# Patient Record
Sex: Male | Born: 1935
Health system: Southern US, Community
[De-identification: ages and names within clinical notes are randomized; demographics above are authoritative.]

## PROBLEM LIST (undated history)

## (undated) DIAGNOSIS — J45909 Unspecified asthma, uncomplicated: Secondary | ICD-10-CM

## (undated) DIAGNOSIS — I10 Essential (primary) hypertension: Secondary | ICD-10-CM

---

## 1982-03-28 HISTORY — PX: HERNIA REPAIR: SHX51

## 1990-03-28 HISTORY — PX: KNEE SURGERY: SHX244

## 1999-03-29 HISTORY — PX: ROTATOR CUFF REPAIR: SHX139

## 1999-10-05 ENCOUNTER — Ambulatory Visit (HOSPITAL_COMMUNITY): Admission: RE | Admit: 1999-10-05 | Discharge: 1999-10-05 | Payer: Self-pay | Admitting: Specialist

## 1999-10-05 ENCOUNTER — Encounter: Payer: Self-pay | Admitting: Specialist

## 1999-10-19 ENCOUNTER — Encounter: Payer: Self-pay | Admitting: Specialist

## 1999-10-20 ENCOUNTER — Inpatient Hospital Stay (HOSPITAL_COMMUNITY): Admission: RE | Admit: 1999-10-20 | Discharge: 1999-10-21 | Payer: Self-pay | Admitting: Specialist

## 2002-04-18 ENCOUNTER — Ambulatory Visit (HOSPITAL_COMMUNITY): Admission: RE | Admit: 2002-04-18 | Discharge: 2002-04-18 | Payer: Self-pay | Admitting: Unknown Physician Specialty

## 2002-07-16 ENCOUNTER — Ambulatory Visit (HOSPITAL_COMMUNITY): Admission: RE | Admit: 2002-07-16 | Discharge: 2002-07-16 | Payer: Self-pay | Admitting: Neurology

## 2002-07-16 ENCOUNTER — Encounter: Payer: Self-pay | Admitting: Neurology

## 2002-08-13 ENCOUNTER — Encounter: Admission: RE | Admit: 2002-08-13 | Discharge: 2002-09-05 | Payer: Self-pay | Admitting: Neurology

## 2003-05-14 ENCOUNTER — Ambulatory Visit (HOSPITAL_COMMUNITY): Admission: RE | Admit: 2003-05-14 | Discharge: 2003-05-14 | Payer: Self-pay | Admitting: Cardiology

## 2003-06-03 ENCOUNTER — Ambulatory Visit (HOSPITAL_COMMUNITY): Admission: RE | Admit: 2003-06-03 | Discharge: 2003-06-03 | Payer: Self-pay | Admitting: Cardiology

## 2003-06-03 ENCOUNTER — Encounter: Admission: RE | Admit: 2003-06-03 | Discharge: 2003-06-03 | Payer: Self-pay | Admitting: Cardiology

## 2003-07-10 ENCOUNTER — Ambulatory Visit (HOSPITAL_COMMUNITY): Admission: RE | Admit: 2003-07-10 | Discharge: 2003-07-10 | Payer: Self-pay | Admitting: Internal Medicine

## 2005-03-28 HISTORY — PX: CATARACT EXTRACTION: SUR2

## 2006-03-28 HISTORY — PX: OTHER SURGICAL HISTORY: SHX169

## 2008-03-28 HISTORY — PX: OTHER SURGICAL HISTORY: SHX169

## 2008-04-15 ENCOUNTER — Ambulatory Visit (HOSPITAL_COMMUNITY): Admission: RE | Admit: 2008-04-15 | Discharge: 2008-04-16 | Payer: Self-pay | Admitting: General Surgery

## 2008-04-19 ENCOUNTER — Emergency Department (HOSPITAL_COMMUNITY): Admission: EM | Admit: 2008-04-19 | Discharge: 2008-04-19 | Payer: Self-pay | Admitting: Emergency Medicine

## 2010-07-12 LAB — URINALYSIS, ROUTINE W REFLEX MICROSCOPIC
Bilirubin Urine: NEGATIVE
Glucose, UA: NEGATIVE mg/dL
Glucose, UA: NEGATIVE mg/dL
Hgb urine dipstick: NEGATIVE
Ketones, ur: NEGATIVE mg/dL
Ketones, ur: 15 mg/dL — AB
Nitrite: NEGATIVE
Protein, ur: NEGATIVE mg/dL
Protein, ur: NEGATIVE mg/dL
Specific Gravity, Urine: 1.017 (ref 1.005–1.030)
Urobilinogen, UA: 0.2 mg/dL (ref 0.0–1.0)
pH: 5.5 (ref 5.0–8.0)

## 2010-07-12 LAB — COMPREHENSIVE METABOLIC PANEL
ALT: 18 U/L (ref 0–53)
AST: 22 U/L (ref 0–37)
CO2: 32 mEq/L (ref 19–32)
Chloride: 104 mEq/L (ref 96–112)
GFR calc Af Amer: >60 mL/min (ref >60)
GFR calc non Af Amer: >60 mL/min (ref >60)
Potassium: 4.6 mEq/L (ref 3.5–5.1)
Sodium: 145 mEq/L (ref 135–145)
Total Bilirubin: 1.6 mg/dL — ABNORMAL HIGH (ref 0.3–1.2)

## 2010-07-12 LAB — CBC
HCT: 42.2 % (ref 39.0–52.0)
Hemoglobin: 14.5 g/dL (ref 13.0–17.0)
MCHC: 34.3 g/dL (ref 30.0–36.0)
MCV: 90.9 fL (ref 78.0–100.0)
Platelets: 193 10*3/uL (ref 150–400)
RBC: 4.65 MIL/uL (ref 4.22–5.81)
RDW: 13.8 % (ref 11.5–15.5)
WBC: 7.4 10*3/uL (ref 4.0–10.5)

## 2010-07-12 LAB — DIFFERENTIAL
Basophils Absolute: 0 10*3/uL (ref 0.0–0.1)
Eosinophils Absolute: 0.2 10*3/uL (ref 0.0–0.7)
Eosinophils Relative: 3 % (ref 0–5)
Lymphs Abs: 1.1 10*3/uL (ref 0.7–4.0)

## 2010-07-12 LAB — URINE CULTURE: Culture: NO GROWTH

## 2010-08-10 NOTE — Op Note (Signed)
[place], [place]                 ACCOUNT NO.:  [id number]   MEDICAL RECORD NO.:  [id number]          PATIENT TYPE:  AMB   LOCATION:  DAY                          FACILITY:  [facility]   PHYSICIAN:  Angelia Mould. Derrell Lolling, M.D.DATE OF BIRTH:  Oct 22, 1926   DATE OF PROCEDURE:  04/15/2008  DATE OF DISCHARGE:                               OPERATIVE REPORT   PREOPERATIVE DIAGNOSES:  1. Recurrent right inguinal hernia.  2. Left inguinal hernia.   POSTOPERATIVE DIAGNOSES:  1. Recurrent right inguinal hernia.  2. Left inguinal hernia.   OPERATION PERFORMED:  Laparoscopic repair(TEPP) of bilateral inguinal  hernias with mesh, right side recurrent.   SURGEON:  Angelia Mould. Derrell Lolling, M.D.   OPERATIVE INDICATIONS:  This is a 76 year old white man who underwent a  right inguinal hernia repair in 1984.  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  On exam in  the office, he has a scar in the right inguinal area and a recurrent  hernia present which was reducible.  He also had a small reducible left  inguinal hernia.  We discussed his situation and decided to proceed with  a laparoscopic repair of his bilateral inguinal hernias.   OPERATIVE FINDINGS:  On the right side the patient clearly had an  incarcerated direct inguinal hernia which I was able to reduce, clearly  delineating the defect in the Hesselbach triangle.  He also had a lot of  scarring along the cord as the peritoneum went up along the cord  structures.  There were a couple of sutures there where I suspected a  sac had been tied off before.  On the left side he had a moderately  large indirect hernia which I was able to clearly delineate and strip  away and out of the inguinal canal, but there was no evidence of direct  hernia on the left.   OPERATIVE TECHNIQUE:  Following the induction of general endotracheal  anesthesia,  the patient's abdomen and genitalia were prepped and draped  in a sterile fashion.  Intravenous antibiotics were given.  The patient  was identified as the correct patient and correct procedure and correct  site.  Half percent Marcaine with epinephrine was used as a local  infiltration anesthetic.  A curved transverse incision was made at the  lower rim of the umbilicus.  The fascia was incised transversely,  exposing the medial border of the right rectus muscle.  A dissector  balloon was placed in the right rectus sheath in the midline down to  just above the symphysis pubis.  The video camera was inserted.  The  dissector balloon was inflated with air under direct vision and held in  place for about 3-4 minutes.  We had good dissection with the rectus  muscles and inferior epigastric vessels bilaterally, dissected  anteriorly, preperitoneal fat dissected posteriorly, and I could see the  symphysis pubis and bilateral Cooper ligaments inferiorly.  The  dissector balloon was deflated and removed.  The trocar balloon was  inflated and the trocar secured and connected to the insufflator at 12  mmHg.  We inserted the video camera and we had good visualization.  We  placed a 5-mm trocar in the midline below the umbilicus and used this to  clean the fascia off laterally as the dissector balloon had not done a  very lateral dissection.  We did this on both sides.  We placed 5-mm  trocars again, one in the right lower quadrant and one in the left lower  quadrant actually at about the level of the umbilicus.  We then  dissected the symphysis pubis and Cooper ligament, exposing them nicely.  On the right side we dissected the indirect hernia out of his pseudosac  and returned the fatty tissue to the preperitoneal space.  On the right  side we dissected the peritoneum away from the cord structures all the  way back to the level of the anterior superior iliac spine.  At this  point we could see the  testicular vessels and vas deferens and that is  really about all that was left.  We turned our attention to the left  side where we cleaned off and mobilized the cord structures and we then  dissected a large indirect sac back out of the inguinal canal and  dissected it well back above the level of the anterior superior iliac  spine.  On each side we placed a Bard 3DMax mesh.  The large size was  used on the left and on the right.  On each side we positioned the mesh  so that it overlapped the midline slightly and overlapped Cooper  ligament inferiorly slightly.  On each side the mesh was tacked along  the superior rim of Cooper ligament with about 4 tacks.  On each side  laterally we placed tacks into the mesh lateral to the cord structures,  and we were very careful to be sure that we could palpate the tacker  through the abdominal wall to stay above the iliopubic tract.  We also  placed on each side a few tacks along the posterior belly of the rectus  muscle and up along the midline.  We then inspected the repairs on each  side and they looked very good and were fairly symmetrical.  This  covered the indirect space, direct space, and femoral space quite  nicely.  There was no bleeding.  Trocars were removed.  The  pneumoperitoneum was released.  The patient did have a significant  pneumoperitoneum and we placed a Veress needle in the right upper  quadrant and evacuated the pneumoperitoneum without any difficulty.  The  trocars were removed.  The fascia at the umbilicus was closed with 2  interrupted figure-of-eight sutures of 0 Vicryl.  After irrigating the  wounds, we closed all the skin incisions with subcuticular sutures of 4-  0 Monocryl and Dermabond.  Clean bandages were placed and the patient  taken to the recovery room in stable condition.  Estimated blood loss  was about 15 mL.  Complications were none.  Sponge, needle and  instrument counts were correct.      Angelia Mould.  Derrell Lolling, M.D.  Electronically Signed     HMI/MEDQ  D:  04/15/2008  T:  04/15/2008  Job:  [id number]   cc:   Joycelyn Rua, M.D.  Fax: [phone]

## 2010-08-13 NOTE — Op Note (Signed)
[facility]  Patient:    Guy Espinoza, Guy Espinoza                        MRN: [id number] Proc. Date: 10/20/99 Adm. Date:  [id number] Disc. Date: [id number] Attending:  Erasmo Leventhal                           Operative Report  PREOPERATIVE DIAGNOSIS:  Left shoulder rotator cuff tear, symptomatic degenerative acromioclavicular joint.  POSTOPERATIVE DIAGNOSIS:  Left shoulder rotator cuff tear, symptomatic degenerative acromioclavicular joint.  PROCEDURE:  Left shoulder subacromial decompression, rotator cuff repair, distal clavicle resection, Mumford procedure.  SURGEON:  R. Valma Cava, M.D.  ASSISTANT:  Cherly Beach, P.A.-C.  ANESTHESIA:  _______ interscalene block and general endotracheal.  ESTIMATED BLOOD LOSS:  Less than 50 cc.  DRAINS:  None.  COMPLICATIONS:  None.  DISPOSITION:  PACU stable.  DESCRIPTION OF PROCEDURE:  The patient was counseled in the holding area and ________ identified.  The chart was removed and papers were signed.  IV started, antibiotics given, and block administered.  Antibiotics were given. Taken to the operating room and placed in the supine position under general endotracheal anesthesia.  Modified beach chair position.  The left shoulder prepped and draped in a sterile fashion.  A longitudinal incision was made through the skin and subcutaneous tissue overlying the [facility] joint and anterior acromion, through the skin and subcutaneous tissues, and small veins electrocoagulated.  The periosteum opened in the usual fashion.  The periosteum was opened over the distal clavicle, AC joint, and anterior acromion.  Deltoid was split, but not detached.  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  Attention was directed to the subacromial region.  The deltoid trapezial fascia was opened.  The rotator cuff  was inspected and found to have a 1 cm tear of the supraspinatus and quite a bit of subacromial bursitis. Subacromial bursectomy was performed.  Type 2 acromion.  Acromioplasty was performed.  The CA ligament was released.  The rotator cuff was inspected anteriorly and based on the MRI scan, he was to have a subscapularis tear.  However, the subscapularis was intact.  The biceps tendon was intact.  The rotator cuff interval did have a small rent in it.  At this time, the second hole revealed a second torn rotator cuff and a #2 Ethibond suture was used to repair that.  The lateral edge of the supraspinatus was also torn at the insertion of the greater tuberosity.  The edges were freshened with a 15 blade.  Bone was prepared for insertion as a corkscrew.  We utilized in all directions to corkscrew system.  This was done.  Then, the rotator cuff was repaired anatomically without any undue tension out to its normal insertion.  Shoulder ________.  Also, preoperatively, MRI scan showed a possible labral tear. However, due to the area of his cuff tear, it was not able to visualize into the joint.  I did not feel like it would be worthwhile to open this gentlemans joint for that significantly that required opening of the rotator cuff.  The _______ and the rotator cuff was repaired.  The wounds were irrigated.  Hemostasis was obtained.  A meticulous closure of the deltoid trapezial fascia and clavicular periosteum was done over the [facility] joint and leading edge of  the acromion with Vicryl suture.  _________ and protected throughout the case.  Subcutaneous closed with Vicryl and the skin closed with staples.  A sterile dressing was applied and another gram of Ancef was given intravenously.  He was then awakened, extubated, and taken to from the operating room to PACU in stable condition.  There were no complications. Sponge and needle count were correct.  The decreased surgical time with ______  throughout this case. DD:  10/20/99 TD:  10/22/99 Job: [id number]

## 2010-08-13 NOTE — Cardiovascular Report (Signed)
NAME:  Guy Espinoza, Guy Espinoza                           ACCOUNT NO.:  [id number]   MEDICAL RECORD NO.:  [id number]                   PATIENT TYPE:  OIB   LOCATION:  2899                                 FACILITY:  [facility]   PHYSICIAN:  Armanda Magic, M.D.                  DATE OF BIRTH:  07/07/1926   DATE OF PROCEDURE:  05/14/2003  DATE OF DISCHARGE:                              CARDIAC CATHETERIZATION   REFERRING PHYSICIAN:  Joycelyn Rua, M.D.   PROCEDURES PERFORMED:  1. Left heart catheterization.  2. Coronary angiography.  3. Left ventriculography.   CARDIOLOGIST:  Armanda Magic, M.D.   INDICATIONS:  Shortness of breath and abnormal Cardiolite.   COMPLICATIONS:  None.   ENTRY SITE:  Intravenous access via right femoral artery, 6 French sheath.   INDICATIONS FOR PROCEDURE:  This is a very pleasant 77 year old white male  with a history of hypertension and shortness of breath; and, his shortness  of breath has recently worsened and he presented for a stress Cardiolite  study, which showed a reversible defect in the inferior wall.  He now  presents for cardiac catheterization.   DESCRIPTION OF PROCEDURE:  The patient was brought to the cardiac  catheterization laboratory in the fasting, nonsedated state.  Informed  consent was obtained.  The patient was connected to continuous heart rate  and pulse oximetry monitoring and intermittent blood pressure monitoring.  The right groin was prepped and draped in a sterile fashion.  One percent  Xylocaine was used for local anesthesia.  Using the modified Seldinger  technique a 6 French sheath was placed in the right femoral artery.  Under  fluoroscopic guidance 6 French JL-4 catheter was placed in the left coronary  artery.  Multiple cine films were taken in the 30-degree RAO and 40-degree  LAO views.  This catheter was then exchanged out of out guidewire for a 6  [place] JR-4 catheter, which was placed under fluoroscopic guidance into the  right coronary artery.  Multiple cine films were taken in the 30-ddegree RAO  and 40-degree LAO views.  This catheter was then exchanged out of our  guidewire for a 6 French  angled pigtail catheter, which was placed under  fluoroscopic guidance in the left ventricular cavity.  Left ventriculography  was performed in 30-degree RAO views using 30 mL of contrast at 15 mL per  second.  The catheter was then pulled back across the aortic valve with no  significant gradient noted.   At the end of the procedure all catheters and sheaths were removed.  Manual  compression was performed until adequate hemostasis was obtained.   The patient was transferred back to his room in stable condition.   RESULTS:  Left Main Coronary Artery:  The left main coronary artery is  widely patent.  It bifurcates into the left anterior descending artery and  left circumflex artery.   Left Anterior  Descending Artery:  The left anterior descending artery is  widely patent in its proximal portion and gives rise to a very small first  diagonal branch.  In between the first and second diagonal branches there is  a 40-50% fairly long narrowing.  The second diagonal is widely patent.  The  distal LAD traverses to the apex and is widely patent.   Left Circumflex:  The left circumflex is patent throughout its course.  It  gives rise to one very large obtuse marginal branch, which has a 30%  proximal narrowing.  It then bifurcates into daughter branches, both of  which are widely patent.   Right Coronary Artery:  The right coronary artery is widely patent  throughout its course and is a very large vessel, which bifurcates distally  into the posterior descending artery and posterolateral artery, both of  which are widely patent.   Left ventriculography:  Left ventriculography shows low-normal left  ventricular systolic function.  EF 50%.  Left ventricular pressure 160/9  mmHg.  LVEDP 14 mmHg.  Aortic pressure 157/83  mmHg.   ASSESSMENT:  1. Shortness of breath of questionable etiology.  2. Nonobstructive coronary disease.  3. Low-normal left ventricular function.   PLAN:  1. Discharge to home after IV fluids and bedrest.  2. Outpatient 2-D echocardiogram to evaluate other etiologies for shortness     of breath such as diastolic dysfunction.  If this is normal then he will     need a pulmonary workup.  3. Would continue current medications.                                               Armanda Magic, M.D.    TT/MEDQ  D:  05/14/2003  T:  05/14/2003  Job:  [id number]   cc:   Joycelyn Rua, M.D.  [address] [place]  [place] [address]  Fax: [phone]

## 2013-09-13 DIAGNOSIS — I739 Peripheral vascular disease, unspecified: Secondary | ICD-10-CM | POA: Insufficient documentation

## 2014-09-15 DIAGNOSIS — M4800 Spinal stenosis, site unspecified: Secondary | ICD-10-CM | POA: Diagnosis present

## 2017-06-16 ENCOUNTER — Emergency Department (HOSPITAL_COMMUNITY): Payer: Medicare Other

## 2017-06-16 ENCOUNTER — Emergency Department (HOSPITAL_COMMUNITY)
Admission: EM | Admit: 2017-06-16 | Discharge: 2017-06-16 | Disposition: A | Discharge status: Home / Self Care | Payer: Medicare Other | Attending: Emergency Medicine | Admitting: Emergency Medicine

## 2017-06-16 ENCOUNTER — Encounter (HOSPITAL_COMMUNITY): Payer: Self-pay | Admitting: Emergency Medicine

## 2017-06-16 DIAGNOSIS — R197 Diarrhea, unspecified: Secondary | ICD-10-CM | POA: Diagnosis not present

## 2017-06-16 DIAGNOSIS — Z79899 Other long term (current) drug therapy: Secondary | ICD-10-CM | POA: Diagnosis not present

## 2017-06-16 DIAGNOSIS — R112 Nausea with vomiting, unspecified: Secondary | ICD-10-CM | POA: Diagnosis present

## 2017-06-16 DIAGNOSIS — E860 Dehydration: Secondary | ICD-10-CM | POA: Diagnosis not present

## 2017-06-16 DIAGNOSIS — R109 Unspecified abdominal pain: Secondary | ICD-10-CM | POA: Insufficient documentation

## 2017-06-16 DIAGNOSIS — J45909 Unspecified asthma, uncomplicated: Secondary | ICD-10-CM | POA: Insufficient documentation

## 2017-06-16 DIAGNOSIS — I10 Essential (primary) hypertension: Secondary | ICD-10-CM | POA: Insufficient documentation

## 2017-06-16 DIAGNOSIS — Z7982 Long term (current) use of aspirin: Secondary | ICD-10-CM | POA: Diagnosis not present

## 2017-06-16 DIAGNOSIS — R55 Syncope and collapse: Secondary | ICD-10-CM | POA: Insufficient documentation

## 2017-06-16 HISTORY — DX: Unspecified asthma, uncomplicated: J45.909

## 2017-06-16 HISTORY — DX: Essential (primary) hypertension: I10

## 2017-06-16 LAB — COMPREHENSIVE METABOLIC PANEL
ALK PHOS: 52 U/L (ref 38–126)
ALT: 16 U/L — AB (ref 17–63)
AST: 24 U/L (ref 15–41)
Albumin: 3.8 g/dL (ref 3.5–5.0)
Anion gap: 11 (ref 5–15)
BUN: 24 mg/dL — ABNORMAL HIGH (ref 6–20)
CALCIUM: 8.6 mg/dL — AB (ref 8.9–10.3)
CO2: 27 mmol/L (ref 22–32)
CREATININE: 0.93 mg/dL (ref 0.61–1.24)
Chloride: 102 mmol/L (ref 101–111)
GFR calc Af Amer: >60 mL/min (ref >60)
Glucose, Bld: 106 mg/dL — ABNORMAL HIGH (ref 65–99)
Potassium: 3.4 mmol/L — ABNORMAL LOW (ref 3.5–5.1)
Sodium: 140 mmol/L (ref 135–145)
Total Bilirubin: 1.2 mg/dL (ref 0.3–1.2)
Total Protein: 6.8 g/dL (ref 6.5–8.1)

## 2017-06-16 LAB — URINALYSIS, ROUTINE W REFLEX MICROSCOPIC
Bilirubin Urine: NEGATIVE
Glucose, UA: NEGATIVE mg/dL
HGB URINE DIPSTICK: NEGATIVE
Ketones, ur: NEGATIVE mg/dL
LEUKOCYTES UA: NEGATIVE
Nitrite: NEGATIVE
Protein, ur: NEGATIVE mg/dL
Specific Gravity, Urine: 1.028 (ref 1.005–1.030)
pH: 5 (ref 5.0–8.0)

## 2017-06-16 LAB — CBC
HCT: 39.9 % (ref 39.0–52.0)
Hemoglobin: 13.3 g/dL (ref 13.0–17.0)
MCH: 30.9 pg (ref 26.0–34.0)
MCHC: 33.3 g/dL (ref 30.0–36.0)
MCV: 92.6 fL (ref 78.0–100.0)
Platelets: 171 10*3/uL (ref 150–400)
RBC: 4.31 MIL/uL (ref 4.22–5.81)
RDW: 13.8 % (ref 11.5–15.5)
WBC: 5.2 10*3/uL (ref 4.0–10.5)

## 2017-06-16 LAB — LIPASE, BLOOD: Lipase: 22 U/L (ref 11–51)

## 2017-06-16 LAB — POC OCCULT BLOOD, ED: FECAL OCCULT BLD: NEGATIVE

## 2017-06-16 MED ORDER — OMEPRAZOLE 20 MG PO CPDR: 20.0000 mg | DELAYED_RELEASE_CAPSULE | Freq: Every day | ORAL | 0 refills | Status: DC

## 2017-06-16 MED ORDER — ONDANSETRON HCL 4 MG PO TABS: 4.0000 mg | ORAL_TABLET | Freq: Four times a day (QID) | ORAL | 0 refills | Status: DC

## 2017-06-16 MED ORDER — IOPAMIDOL (ISOVUE-300) INJECTION 61%
100.0000 mL | Freq: Once | INTRAVENOUS | Status: AC | PRN
  Administered 2017-06-16: 100 mL via INTRAVENOUS

## 2017-06-16 MED ORDER — SODIUM CHLORIDE 0.9 % IJ SOLN
INTRAMUSCULAR | Status: AC
  Filled 2017-06-16: qty 50

## 2017-06-16 MED ORDER — IOPAMIDOL (ISOVUE-300) INJECTION 61%
INTRAVENOUS | Status: AC
  Filled 2017-06-16: qty 100

## 2017-06-16 NOTE — ED Notes (Signed)
ED Provider at Bedside 

## 2017-06-16 NOTE — ED Notes (Signed)
Pt given Water

## 2017-06-16 NOTE — ED Notes (Signed)
Occult card in room

## 2017-06-16 NOTE — ED Notes (Signed)
Patient transported to CT 

## 2017-06-16 NOTE — ED Notes (Signed)
This RN attempted to gain IV access x1. Will attempt 1 more time, then consult another nurse.

## 2017-06-16 NOTE — Discharge Instructions (Signed)
Your evaluated in the emergency department for nausea vomiting and diarrhea.  Your blood work was unremarkable.  Your CAT scan did show some thickening of the upper stomach and a small hiatal hernia.  This may be related to acid damage to the stomach.  We are prescribing you an antacid and some medicine for nausea.  He should continue a clear liquid diet and advance slowly as tolerated.  Please follow-up with your doctor and return to the emergency department if any worsening symptoms.

## 2017-06-16 NOTE — ED Triage Notes (Signed)
Per pt, states diarrhea that started yesterday-states little PO intake-states he vomited 3 times last night and has had 3 loose stools since 0500-states abdominal soreness, not really pain-took Pepto with no relief

## 2017-06-16 NOTE — ED Notes (Signed)
Bed: WA06 Expected date:  Expected time:  Means of arrival:  Comments: Hold for triage 2 

## 2017-06-16 NOTE — ED Provider Notes (Signed)
[facility]-EMERGENCY DEPT Provider Note   CSN: [id number] Arrival date & time: 06/16/17  1043     History   Chief Complaint Chief Complaint  Patient presents with  . Diarrhea  . Near Syncope    HPI [name] is a 84 y.o. male.  A 84 year old male presents with acute onset of 2 episodes of vomiting food material and multiple episodes of incontinence of stool black and watery in nature.  Associated with some diffuse abdominal pain.  He denies any fever.  No sick contacts.  He rates the abdominal pain is mild.  He is tried nothing for it.  He called his PCP and was told to come to the emergency department for CAT scan.  The history is provided by the patient.  Diarrhea   This is a new problem. The current episode started 2 days ago. The problem occurs 2 to 4 times per day. The problem has been gradually improving. The stool consistency is described as watery. There has been no fever. Associated symptoms include abdominal pain, vomiting and cough. Pertinent negatives include no chills and no arthralgias. He has tried nothing for the symptoms. The treatment provided no relief.  Near Syncope  Associated symptoms include abdominal pain. Pertinent negatives include no chest pain and no shortness of breath.    Past Medical History:  Diagnosis Date  . Asthma   . Hypertension     There are no active problems to display for this patient.    PMH/F/S reviewed by me    Home Medications    Prior to Admission medications   Medication Sig Start Date End Date Taking? Authorizing Provider  aspirin EC 81 MG tablet Take 81 mg by mouth daily.   Yes [provider]  Ferrous Sulfate (IRON) 325 (65 Fe) MG TABS Take 1 tablet by mouth daily.   Yes [provider]  hydrochlorothiazide (HYDRODIURIL) 25 MG tablet Take 25 mg by mouth daily.   Yes [provider]  Multiple Vitamins-Minerals (MULTIVITAMIN ADULT) TABS Take 1 tablet by mouth daily.    Yes [provider]  Omega-3 Fatty Acids (FISH OIL PO) Take 1 capsule by mouth daily.   Yes [provider]  OMEGA-3 KRILL OIL PO Take 1 capsule by mouth daily.   Yes [provider]  saw palmetto 160 MG capsule Take 160 mg by mouth daily.   Yes [provider]  tamsulosin (FLOMAX) 0.4 MG CAPS capsule Take 0.4 mg by mouth daily after supper.   Yes [provider]  omeprazole (PRILOSEC) 20 MG capsule Take 1 capsule (20 mg total) by mouth daily. 06/16/17   Terrilee FilesButler, Michael C, MD  ondansetron (ZOFRAN) 4 MG tablet Take 1 tablet (4 mg total) by mouth every 6 (six) hours. 06/16/17   Terrilee FilesButler, Michael C, MD    Family History No family history on file.  Social History Social History   Tobacco Use  . Smoking status: Never Smoker  Substance Use Topics  . Alcohol use: Not Currently  . Drug use: Never     Allergies   Patient has no known allergies.   Review of Systems Review of Systems  Constitutional: Negative for chills and fever.  HENT: Negative for ear pain and sore throat.   Eyes: Negative for pain and visual disturbance.  Respiratory: Positive for cough. Negative for shortness of breath.   Cardiovascular: Positive for near-syncope. Negative for chest pain and palpitations.  Gastrointestinal: Positive for abdominal pain, diarrhea and vomiting.  Genitourinary: Negative for dysuria and hematuria.  Musculoskeletal: Positive for back pain. Negative for arthralgias.  Skin: Negative for color change and rash.  Neurological: Negative for seizures and syncope.  All other systems reviewed and are negative.    Physical Exam Updated Vital Signs BP 140/76 (BP Location: Right Arm)   Pulse 93   Temp 97.9 F (36.6 C) (Oral)   Resp (!) 22   Ht 5\' 9"  (1.753 m)   Wt 63.5 kg (140 lb)   SpO2 95%   BMI 20.67 kg/m   Physical Exam  Constitutional: He appears well-developed and well-nourished.  HENT:  Head: Normocephalic and atraumatic.  Eyes:  Conjunctivae are normal.  Neck: Neck supple.  Cardiovascular: Normal rate and regular rhythm.  No murmur heard. Pulmonary/Chest: Effort normal and breath sounds normal. No respiratory distress.  Abdominal: Soft. He exhibits no mass. There is tenderness (ruq). There is no rebound and no guarding.  Musculoskeletal: He exhibits no edema, tenderness or deformity.  Neurological: He is alert.  Skin: Skin is warm and dry.  Psychiatric: He has a normal mood and affect.  Nursing note and vitals reviewed.    ED Treatments / Results  Labs (all labs ordered are listed, but only abnormal results are displayed) Labs Reviewed  COMPREHENSIVE METABOLIC PANEL - Abnormal; Notable for the following components:      Result Value   Potassium 3.4 (*)    Glucose, Bld 106 (*)    BUN 24 (*)    Calcium 8.6 (*)    ALT 16 (*)    All other components within normal limits  LIPASE, BLOOD  CBC  URINALYSIS, ROUTINE W REFLEX MICROSCOPIC  POC OCCULT BLOOD, ED    EKG  EKG Interpretation None       EKG Interpretation  Date/Time:  Friday June 16 2017 13:35:40 EDT Ventricular Rate:  56 PR Interval:    QRS Duration: 119 QT Interval:  428 QTC Calculation: 413 R Axis:   -67 Text Interpretation:  Unknown rhythm, irregular rate LAD, consider left anterior fascicular block Anterior infarct, old nonspecific lateral t waves Confirmed by Meridee Score [phone]) on 06/16/2017 1:46:02 PM        Radiology Ct Abdomen Pelvis W Contrast  Result Date: 06/16/2017 CLINICAL DATA:  Acute generalized abdominal pain, 2 episodes of vomiting, multiple episodes of incontinence with watery black stool, no fever, history asthma, hypertension EXAM: CT ABDOMEN AND PELVIS WITH CONTRAST TECHNIQUE: Multidetector CT imaging of the abdomen and pelvis was performed using the standard protocol following bolus administration of intravenous contrast. CONTRAST:  ISOVUE-300 IOPAMIDOL (ISOVUE-300) INJECTION 61% IV. Dilute oral  contrast. COMPARISON:  None FINDINGS: Lower chest: Minimal atelectasis at base of lingula and dependently in RIGHT lower lobe. Lungs appear hyperinflated but clear. Hepatobiliary: Tiny hepatic cysts. Distended gallbladder without definite wall thickening or calcification. No biliary dilatation or additional hepatic abnormalities. Pancreas: Normal appearance Spleen: Normal appearance Adrenals/Urinary Tract: Adrenal glands, kidneys, ureters, and bladder normal appearance Stomach/Bowel: Normal appendix. Large and small bowel loops unremarkable. Stomach incompletely distended. Small hiatal hernia with question wall thickening of distal esophagus and gastroesophageal junction, could be inflammatory or neoplastic in origin. Distal gastric antrum also shows questionable wall thickening versus artifact from underdistention. Vascular/Lymphatic: Scattered pelvic phleboliths. Atherosclerotic calcifications aorta, iliac arteries and coronary arteries. Aorta normal caliber. No adenopathy. Reproductive: Prostatic enlargement gland measuring 5.2 x 3.7 x 3.5 cm. Other: No free air or free fluid. Question prior ventral hernia repair in pelvis. No acute inflammatory process. Musculoskeletal: Bones  demineralized with multilevel degenerative disc disease changes scoliosis of the thoracolumbar spine. IMPRESSION: Small hiatal hernia with questionable gastric wall thickening at the cardia extending into distal esophagus, less at distal gastric antrum, could be related to gastritis/esophagitis though neoplasm not excluded; follow-up EGD recommended. Prostatic enlargement. Electronically Signed   By: Ulyses Southward M.D.   On: 06/16/2017 14:47    Procedures Procedures (including critical care time)  Medications Ordered in ED Medications - No data to display   Initial Impression / Assessment and Plan / ED Course  I have reviewed the triage vital signs and the nursing notes.  Pertinent labs & imaging results that were available  during my care of the patient were reviewed by me and considered in my medical decision making (see chart for details).  Clinical Course as of Jun 16 1908  Fri Jun 16, 2017  1609 Patient has had no further vomiting or diarrhea since he has been here.  He is guaiac negative from below.  He does admit that he has been taking Pepto-Bismol and this is likely accounts for his black stools.  His lab work was unremarkable with a normal white count and H&H.  His potassium which is slightly low but the rest of his electrolytes look very normal.  We are going to try to p.o. trial him here and if he does well he likely can be discharged to follow-up with his PCP.   [MB]  1627 Patient's been ambulation in the bathroom to urinate and states his nausea and vomiting resolved.  He had just a small little bit of stool.  He has been drinking here and feels comfortable going home.  He has a follow-up appointment with his PCP next week and understands to return if any worsening symptoms.   [MB]    Clinical Course User Index [MB] Terrilee Files, MD     Final Clinical Impressions(s) / ED Diagnoses   Final diagnoses:  Nausea vomiting and diarrhea  Dehydration    ED Discharge Orders        Ordered    ondansetron (ZOFRAN) 4 MG tablet  Every 6 hours     06/16/17 1619    omeprazole (PRILOSEC) 20 MG capsule  Daily     06/16/17 1619       Terrilee Files, MD 06/16/17 1912

## 2018-01-02 ENCOUNTER — Ambulatory Visit (INDEPENDENT_AMBULATORY_CARE_PROVIDER_SITE_OTHER): Payer: Medicare Other | Admitting: Neurology

## 2018-01-02 ENCOUNTER — Encounter: Payer: Self-pay | Admitting: Neurology

## 2018-01-02 ENCOUNTER — Telehealth: Payer: Self-pay | Admitting: Neurology

## 2018-01-02 VITALS — BP 135/86 | HR 76 | Ht 69.0 in | Wt 126.0 lb

## 2018-01-02 DIAGNOSIS — G3281 Cerebellar ataxia in diseases classified elsewhere: Secondary | ICD-10-CM

## 2018-01-02 DIAGNOSIS — R269 Unspecified abnormalities of gait and mobility: Secondary | ICD-10-CM | POA: Diagnosis not present

## 2018-01-02 DIAGNOSIS — H814 Vertigo of central origin: Secondary | ICD-10-CM

## 2018-01-02 DIAGNOSIS — W19XXXA Unspecified fall, initial encounter: Secondary | ICD-10-CM

## 2018-01-02 DIAGNOSIS — R42 Dizziness and giddiness: Secondary | ICD-10-CM

## 2018-01-02 DIAGNOSIS — E538 Deficiency of other specified B group vitamins: Secondary | ICD-10-CM

## 2018-01-02 NOTE — Progress Notes (Signed)
Reason for visit: Dizziness, gait disturbance  Referring physician: Dr. Ruthann Cancer is a 84 y.o. male  History of present illness:  Guy Espinoza is an 84 year old right-handed white male with a history of positional vertigo, treated by Dr. Sandria Manly in 2004.  At that time, MRI of the brain showed a moderate level of periventricular white matter disease excluding the brainstem area.  The patient has had episodes of vertigo off and on since that time, he may have some dizziness if he rolls over in bed or makes sudden head movement.  The patient was washing his truck in mid August 2019.  The patient stepped down off the truck and missed a step, he fell backwards and struck the back of his head.  He was dazed but he did not lose consciousness.  The patient was able to get up and go into the house but then had an event of severe vertigo, he was able to hold onto something and not fall down.  The patient has had more frequent events of vertigo since that time, he may oftentimes get dizzy when he gets up out of bed in the morning or rolls over in bed.  He has to hold onto the walls to walk at times.  He has not had any further falls.  He has not gotten x-rays or a head scan evaluation following the fall.  He believes that his ability to ambulate has changed, he is having a tendency to catch his toes when he walks, he is walking with his feet wider apart.  He denies any neck pain, he denies headaches, he has not had any further severe vertigo episodes.  The patient reports no slurred speech or difficulty swallowing, he has developed a cough in the last 2 weeks, he is just finished antibiotic therapy but the cough is persistent.  The patient feels weak all over, he denies any numbness of the extremities.  The patient denies any double vision but he indicates that his visual acuity in his left eye is decreased following a burst blood vessel several years ago.  The patient is sent to this office for an  evaluation.  Past Medical History:  Diagnosis Date  . Asthma   . Hypertension     Past Surgical History:  Procedure Laterality Date  . CATARACT EXTRACTION Left 2007  . double hernia repair  2010  . HERNIA REPAIR  1984  . KNEE SURGERY Left 1992  . ROTATOR CUFF REPAIR Left 2001  . ruptured blood vessel L eye Left 2008    Family History  Problem Relation Age of Onset  . Bronchitis Mother   . Cancer Mother        ovarian    Social history:  reports that he has never smoked. He has never used smokeless tobacco. He reports that he does not drink alcohol or use drugs.  Medications:  Prior to Admission medications   Medication Sig Start Date End Date Taking? Authorizing Provider  aspirin EC 81 MG tablet Take 81 mg by mouth daily.   Yes [provider]  Ferrous Sulfate (IRON) 325 (65 Fe) MG TABS Take 1 tablet by mouth daily.   Yes [provider]  hydrochlorothiazide (HYDRODIURIL) 25 MG tablet Take 25 mg by mouth daily.   Yes [provider]  Multiple Vitamins-Minerals (MULTIVITAMIN ADULT) TABS Take 1 tablet by mouth daily.   Yes [provider]  Omega-3 Fatty Acids (FISH OIL PO) Take 1,000 mg  by mouth daily.    Yes [provider]  omeprazole (PRILOSEC) 20 MG capsule Take 1 capsule (20 mg total) by mouth daily. 06/16/17  Yes Terrilee Files, MD  saw palmetto 160 MG capsule Take 160 mg by mouth daily.   Yes [provider]  tamsulosin (FLOMAX) 0.4 MG CAPS capsule Take 0.4 mg by mouth daily after supper.   Yes [provider]     No Known Allergies  ROS:  Out of a complete 14 system review of symptoms, the patient complains only of the following symptoms, and all other reviewed systems are negative.  Double vision Shortness of breath, cough, wheezing Easy bruising Joint pain Runny nose Memory loss, confusion, dizziness, tremor  Blood pressure 135/86, pulse 76, height 5\' 9"  (1.753 m), weight 126 lb (57.2  kg).  Physical Exam  General: The patient is alert and cooperative at the time of the examination.  Eyes: Pupils are equal, round, and reactive to light. Discs are flat bilaterally.  Neck: The neck is supple, no carotid bruits are noted.  Respiratory: The respiratory examination is clear.  The patient has frequent coughing.  Cardiovascular: The cardiovascular examination reveals a regular rate and rhythm, no obvious murmurs or rubs are noted.  Skin: Extremities are without significant edema.  Neurologic Exam  Mental status: The patient is alert and oriented x 3 at the time of the examination. The patient has apparent normal recent and remote memory, with an apparently normal attention span and concentration ability.  Cranial nerves: Facial symmetry is present. There is good sensation of the face to pinprick and soft touch bilaterally. The strength of the facial muscles and the muscles to head turning and shoulder shrug are normal bilaterally. Speech is well enunciated, no aphasia or dysarthria is noted. Extraocular movements are full. Visual fields are full. The tongue is midline, and the patient has symmetric elevation of the soft palate. No obvious hearing deficits are noted.  Motor: The motor testing reveals 5 over 5 strength of all 4 extremities. Good symmetric motor tone is noted throughout.  Sensory: Sensory testing is intact to pinprick, soft touch, vibration sensation, and position sense on all 4 extremities, with exception that there is a significant decrease in vibration sensation in both feet. No evidence of extinction is noted.  Coordination: Cerebellar testing reveals good finger-nose-finger and heel-to-shin bilaterally.  Gait and station: Gait is wide-based, unsteady.  Tandem gait is unsteady.  Romberg is negative but is unsteady.  Reflexes: Deep tendon reflexes are symmetric and normal bilaterally.  The ankle jerk reflexes are well-maintained bilaterally.  Toes are  downgoing bilaterally.   Assessment/Plan:  1.  History of positional vertigo in the past  2.  Recent minor head injury, posttraumatic vertigo  3.  Gait disturbance  The patient has had a bump to the head, he has not had medical evaluation following the fall, but he has had a change in his ability to ambulate.  The patient does have a pre-existing history of positional vertigo, this has worsened since the fall.  The patient will be sent for CT scan of the head and cervical spine.  Blood work will be done today.  The patient will be sent for physical therapy for vestibular rehabilitation and gait training.  I have recommended that the patient use a cane for ambulation.  He will follow-up in 3 to 4 months.  Guy Palau MD 01/02/2018 8:42 AM  [facility] [address] [place], [place]  [address]  Phone [phone] Fax [phone]

## 2018-01-02 NOTE — Telephone Encounter (Signed)
Left vmail on home number for pt to be aware of this I also left their number of [phone] just incase he hasn't heard from them in the next 2-3 days.

## 2018-01-02 NOTE — Patient Instructions (Signed)
We will get physical therapy for the dizziness.

## 2018-01-02 NOTE — Telephone Encounter (Signed)
[employer] medicare order sent to [facility]. They will contact pt to schedule.

## 2018-01-04 ENCOUNTER — Telehealth: Payer: Self-pay

## 2018-01-04 LAB — COPPER, SERUM: Copper: 95 ug/dL (ref 72–166)

## 2018-01-04 LAB — RPR: RPR: NONREACTIVE

## 2018-01-04 LAB — VITAMIN B12: VITAMIN B 12: 732 pg/mL (ref 232–1245)

## 2018-01-04 NOTE — Telephone Encounter (Signed)
I called and spoke with patient and made him aware of normal lab results. Patient voiced understanding and appreciation and did not have any questions at this time.

## 2018-01-04 NOTE — Telephone Encounter (Signed)
Notes recorded by York Spaniel, MD on 01/04/2018 at 7:16 AM EDT The blood work results are unremarkable.

## 2018-01-09 ENCOUNTER — Telehealth: Payer: Self-pay | Admitting: Neurology

## 2018-01-09 ENCOUNTER — Ambulatory Visit
Admission: RE | Admit: 2018-01-09 | Discharge: 2018-01-09 | Disposition: A | Discharge status: Home / Self Care | Payer: Medicare Other | Source: Ambulatory Visit | Attending: Neurology | Admitting: Neurology

## 2018-01-09 ENCOUNTER — Telehealth: Payer: Self-pay | Admitting: *Deleted

## 2018-01-09 DIAGNOSIS — H814 Vertigo of central origin: Secondary | ICD-10-CM | POA: Diagnosis not present

## 2018-01-09 DIAGNOSIS — R269 Unspecified abnormalities of gait and mobility: Secondary | ICD-10-CM

## 2018-01-09 DIAGNOSIS — W19XXXA Unspecified fall, initial encounter: Secondary | ICD-10-CM

## 2018-01-09 DIAGNOSIS — G3281 Cerebellar ataxia in diseases classified elsewhere: Secondary | ICD-10-CM

## 2018-01-09 NOTE — Telephone Encounter (Signed)
[facility] called. I spoke with Diane. She had a call report on CT cervical spine showing. Results can be viewed in Epic. Advised I will let Dr. Anne Hahn know. She verbalized understanding

## 2018-01-09 NOTE — Telephone Encounter (Signed)
I called the patient.  The CT scan of the cervical spine shows a nondisplaced fracture of the T1 vertebral body, small compression fracture of the T2 and T3 vertebral bodies, no evidence of compromise of the spinal cord canal.  There is nothing here that should be altering the patient's ability to ambulate.  I have looked at the CT of the head this has not been formally read.  I do not see any acute changes.  Patient will call back if he has any further questions.   CT cervical 01/09/18:  IMPRESSION: 1. Possible nondisplaced T1 vertebral body fracture. 2. T2 compression fracture with 25% height loss and sclerosis along the superior endplate, possibly subacute or chronic. 3. Mild central T3 compression deformity of indeterminate age though potentially chronic. 4. Diffuse cervical disc and facet degeneration resulting in neural foraminal stenosis as above, moderate on the left at C4-5 and severe on the left at C7-T1.

## 2018-01-10 NOTE — Telephone Encounter (Signed)
CT head 01/09/18:  IMPRESSION:   CT head (without) demonstrating: -Mild atrophy. -Moderate chronic small vessel ischemic disease. -No acute findings.

## 2018-05-08 ENCOUNTER — Ambulatory Visit (INDEPENDENT_AMBULATORY_CARE_PROVIDER_SITE_OTHER): Payer: Medicare Other | Admitting: Neurology

## 2018-05-08 ENCOUNTER — Encounter: Payer: Self-pay | Admitting: Neurology

## 2018-05-08 VITALS — BP 148/74 | HR 76 | Ht 69.0 in | Wt 135.0 lb

## 2018-05-08 DIAGNOSIS — R42 Dizziness and giddiness: Secondary | ICD-10-CM | POA: Diagnosis not present

## 2018-05-08 DIAGNOSIS — F0781 Postconcussional syndrome: Secondary | ICD-10-CM

## 2018-05-08 DIAGNOSIS — R269 Unspecified abnormalities of gait and mobility: Secondary | ICD-10-CM

## 2018-05-08 NOTE — Progress Notes (Signed)
Reason for visit: Vertigo, gait instability  Guy Espinoza is an 85 y.o. male  History of present illness:  Guy Espinoza is a 85 year old right-handed white male with a history of vertigo in the past.  The patient fell washing his truck in August 2019, he fell backwards and struck the back of his head.  The patient had onset of significant vertigo since that time, he has also had some gait instability that has been persistent since that time.  He has undergone a CT scan of the brain that was unremarkable, but a CT of the cervical spine did show fractures that were possibly subacute or chronic at the T1, T2, and T3 levels.  The patient does not report a lot of discomfort in the neck or mid back.  He does have some low back pain.  The patient has had ongoing issues with dizziness, he notes dizziness when he sits up out of bed or if he bends over for more than a minute he has a sensation that he is going to fall forward.  The patient has had ongoing problems with instability with walking, he has stumbled but not falling.  He denies any neck pain or headache.  He has developed some shortness of breath with physical activity that has been new since the fall in August 2019.  He has not discussed this issue with his primary care physician yet.  Past Medical History:  Diagnosis Date  . Asthma   . Hypertension     Past Surgical History:  Procedure Laterality Date  . CATARACT EXTRACTION Left 2007  . double hernia repair  2010  . HERNIA REPAIR  1984  . KNEE SURGERY Left 1992  . ROTATOR CUFF REPAIR Left 2001  . ruptured blood vessel L eye Left 2008    Family History  Problem Relation Age of Onset  . Bronchitis Mother   . Cancer Mother        ovarian    Social history:  reports that he has never smoked. He has never used smokeless tobacco. He reports that he does not drink alcohol or use drugs.   No Known Allergies  Medications:  Prior to Admission medications   Medication Sig Start Date  End Date Taking? Authorizing Provider  aspirin EC 81 MG tablet Take 81 mg by mouth daily.   Yes [provider]  Ferrous Sulfate (IRON) 325 (65 Fe) MG TABS Take 1 tablet by mouth daily.   Yes [provider]  hydrochlorothiazide (HYDRODIURIL) 25 MG tablet Take 25 mg by mouth daily.   Yes [provider]  Multiple Vitamins-Minerals (MULTIVITAMIN ADULT) TABS Take 1 tablet by mouth daily.   Yes [provider]  Omega-3 Fatty Acids (FISH OIL PO) Take 1,000 mg by mouth daily.    Yes [provider]  omeprazole (PRILOSEC) 20 MG capsule Take 1 capsule (20 mg total) by mouth daily. 06/16/17  Yes Terrilee Files, MD  saw palmetto 160 MG capsule Take 160 mg by mouth daily.   Yes [provider]  tamsulosin (FLOMAX) 0.4 MG CAPS capsule Take 0.4 mg by mouth daily after supper.   Yes [provider]    ROS:  Out of a complete 14 system review of symptoms, the patient complains only of the following symptoms, and all other reviewed systems are negative.  Runny nose, drooling Eye itching Cough, wheezing, shortness of breath Difficulty urinating, frequency of urination Joint pain, back pain, walking difficulty Bruising easily Memory  loss, dizziness, tremors  Blood pressure (!) 148/74, pulse 76, height 5\' 9"  (1.753 m), weight 135 lb (61.2 kg), SpO2 93 %.  Physical Exam  General: The patient is alert and cooperative at the time of the examination.  Skin: No significant peripheral edema is noted.   Neurologic Exam  Mental status: The patient is alert and oriented x 3 at the time of the examination. The patient has apparent normal recent and remote memory, with an apparently normal attention span and concentration ability.   Cranial nerves: Facial symmetry is present. Speech is normal, no aphasia or dysarthria is noted. Extraocular movements are full. Visual fields are full.  Motor: The patient has good strength in all 4  extremities.  Sensory examination: Soft touch sensation is symmetric on the face, arms, and legs.  Coordination: The patient has good finger-nose-finger and heel-to-shin bilaterally.  Gait and station: The patient has a wide-based, unsteady gait.  Tandem gait was not attempted.  Romberg is negative but is unsteady.  Reflexes: Deep tendon reflexes are symmetric.   CT head 01/09/18:  IMPRESSION:   CT head (without) demonstrating: -Mild atrophy. -Moderate chronic small vessel ischemic disease. -No acute findings.  * CT scan images were reviewed online. I agree with the written report.   CT cervical 01/09/18:  IMPRESSION: 1. Possible nondisplaced T1 vertebral body fracture. 2. T2 compression fracture with 25% height loss and sclerosis along the superior endplate, possibly subacute or chronic. 3. Mild central T3 compression deformity of indeterminate age though potentially chronic. 4. Diffuse cervical disc and facet degeneration resulting in neural foraminal stenosis as above, moderate on the left at C4-5 and severe on the left at C7-T1.    Assessment/Plan:  1.  Postconcussive vertigo  2.  Gait instability  The patient is clearly having ongoing symptoms, his walking has continued to be unsteady.  He apparently never went for physical therapy since the last visit, I will set this up again, he will have vestibular therapy and physical therapy working on gait and balance and safety.  He will follow-up here in 6 months.  The patient is having some shortness of breath with minimal activity, he needs to discuss this issue with his primary care physician.  Guy Espinoza. Keith Zamoria Boss MD 05/08/2018 1:56 PM  [facility] [address] [place] [address]  Phone [phone] Fax [phone]

## 2018-05-24 ENCOUNTER — Other Ambulatory Visit: Payer: Self-pay

## 2018-05-24 ENCOUNTER — Ambulatory Visit: Payer: Medicare Other | Attending: Neurology | Admitting: Rehabilitative and Restorative Service Providers"

## 2018-05-24 ENCOUNTER — Encounter: Payer: Self-pay | Admitting: Rehabilitative and Restorative Service Providers"

## 2018-05-24 DIAGNOSIS — R2689 Other abnormalities of gait and mobility: Secondary | ICD-10-CM | POA: Diagnosis not present

## 2018-05-24 DIAGNOSIS — H8113 Benign paroxysmal vertigo, bilateral: Secondary | ICD-10-CM | POA: Diagnosis present

## 2018-05-24 DIAGNOSIS — R2681 Unsteadiness on feet: Secondary | ICD-10-CM

## 2018-05-25 NOTE — Therapy (Signed)
[facility] [address] [place], [place], [address] Phone: [phone]   Fax:  [phone]  Physical Therapy Evaluation  Patient Details  Name: Guy Espinoza MRN: [id number] Date of Birth: 1927/03/13 Referring Provider (PT): Lesly Dukes, MD   Encounter Date: 05/24/2018  PT End of Session - 05/24/18 1501    Visit Number  1    Number of Visits  17    Date for PT Re-Evaluation  07/23/18    Authorization Type  *check insurance authorization    PT Start Time  [phone]    PT Stop Time  0930    PT Time Calculation (min)  40 min    Equipment Utilized During Treatment  Gait belt    Activity Tolerance  Patient tolerated treatment well    Behavior During Therapy  [facility] for tasks assessed/performed       Past Medical History:  Diagnosis Date  . Asthma   . Hypertension     Past Surgical History:  Procedure Laterality Date  . CATARACT EXTRACTION Left 2007  . double hernia repair  2010  . HERNIA REPAIR  1984  . KNEE SURGERY Left 1992  . ROTATOR CUFF REPAIR Left 2001  . ruptured blood vessel L eye Left 2008    There were no vitals filed for this visit.   Subjective Assessment - 05/24/18 0856    Subjective  The patient presents with dizziness and imbalance noting that he cannot get up well in the middle of the night.  He also reports the numbers on his clock go "round and round" or "shake" when he gets into bed.  He reports bending forward and rolling in bed aggravate symtpoms.  He is having a harder time keeping his balance.      Pertinent History  asthma and HTN    Patient Stated Goals  Reduce dizziness, improve balance    Currently in Pain?  Yes    Pain Score  0-No pain   at rest, moving provokes pain   Pain Location  --   L shoulder and back   Pain Orientation  --   L shoulder   Pain Descriptors / Indicators  Aching    Pain Onset  More than a month ago    Pain Frequency  Intermittent    Aggravating Factors    movement    Pain Relieving Factors  rest         [facility] PT Assessment - 05/24/18 0859      Assessment   Medical Diagnosis  post concussion syndrome, PT for vertigo and gait instability    Referring Provider (PT)  Lesly Dukes, MD    Onset Date/Surgical Date  --   10/2017   Prior Therapy  none      Precautions   Precautions  Fall;Other (comment)    Precaution Comments  h/o compression fractures per imaging      Restrictions   Weight Bearing Restrictions  No      Balance Screen   Has the patient fallen in the past 6 months  Yes    How many times?  2    Has the patient had a decrease in activity level because of a fear of falling?   Yes    Is the patient reluctant to leave their home because of a fear of falling?   No      Home Public house manager residence    Living Arrangements  Spouse/significant other    Type of Home  House    Home Access  Stairs to enter    Entrance Stairs-Number of Steps  5    Entrance Stairs-Rails  Right   going nup   Home Layout  One level    Home Equipment  [place] - single point      Prior Function   Level of Independence  Independent    Leisure  Has not driven in the last 2 years due to "I don't need to"      Cognition   Overall Cognitive Status  Within Functional Limits for tasks assessed      Ambulation/Gait   Ambulation/Gait  Yes    Ambulation/Gait Assistance  5: Supervision;4: Min guard    Ambulation Distance (Feet)  100 Feet    Assistive device  None    Gait Pattern  Decreased stride length;Decreased dorsiflexion - right;Decreased dorsiflexion - left;Narrow base of support;Trunk flexed    Ambulation Surface  Level;Indoor    Gait velocity  2.64 ft/sec    Stairs  Yes    Stairs Assistance  5: Supervision    Stair Management Technique  Two rails;Step to pattern   slowed pace   Number of Stairs  4    Gait Comments  He notes he is not using his cane as recommended by MD at this time.      Standardized  Balance Assessment   Standardized Balance Assessment  Berg Balance Test      Berg Balance Test   Sit to Stand  Able to stand  independently using hands    Standing Unsupported  Able to stand safely 2 minutes    Sitting with Back Unsupported but Feet Supported on Floor or Stool  Able to sit safely and securely 2 minutes    Stand to Sit  Controls descent by using hands    Transfers  Able to transfer safely, definite need of hands    Standing Unsupported with Eyes Closed  Able to stand 10 seconds with supervision    Standing Ubsupported with Feet Together  Able to place feet together independently and stand 1 minute safely    From Standing, Reach Forward with Outstretched Arm  Can reach forward >5 cm safely (2")    From Standing Position, Pick up Object from Floor  Able to pick up shoe, needs supervision    From Standing Position, Turn to Look Behind Over each Shoulder  Turn sideways only but maintains balance    Turn 360 Degrees  Needs close supervision or verbal cueing    Standing Unsupported, Alternately Place Feet on Step/Stool  Able to complete >2 steps/needs minimal assist    Standing Unsupported, One Foot in Front  Needs help to step but can hold 15 seconds    Standing on One Leg  Tries to lift leg/unable to hold 3 seconds but remains standing independently    Total Score  35    Berg comment:  Shortness of breath noted during Berg.  SpO2=92%, HR=78.  35/56 indicates high fall risk.           Vestibular Assessment - 05/24/18 0903      Vestibular Assessment   General Observation  The patient ambulates slowly with unsteadiness and holding walls while coming into clinic.  "I have to focus twice to get letters straightened out."      Symptom Behavior   Type of Dizziness  Spinning   and imbalance   Frequency of Dizziness  daily    Duration of Dizziness  Imbalance when on his feet and spinning intermittently that lasts for seconds.    Aggravating Factors  Turning head quickly;Sit to  stand;Lying supine;Rolling to right;Rolling to left;Forward bending      Occulomotor Exam   Occulomotor Alignment  Normal    Spontaneous  Absent    Gaze-induced  Absent    Smooth Pursuits  Intact    Saccades  Intact      Vestibulo-Occular Reflex   VOR 1 Head Only (x 1 viewing)  slow VOR does not provoke dizziness.    Comment  Head impulse test=mild refixation saccade noted to the right side *limited assessment due to muscle guarding in neck      Positional Testing   Dix-Hallpike  Dix-Hallpike Right;Dix-Hallpike Left    Horizontal Canal Testing  Horizontal Canal Right;Horizontal Canal Left      Dix-Hallpike Right   Dix-Hallpike Right Duration  20 seconds    Dix-Hallpike Right Symptoms  Upbeat, right rotatory nystagmus   large amplitude nystagmus viewed in room light     Dix-Hallpike Left   Dix-Hallpike Left Duration  8 seconds    Dix-Hallpike Left Symptoms  Upbeat, left rotatory nystagmus   less severe in intensity than R     Horizontal Canal Right   Horizontal Canal Right Duration  none    Horizontal Canal Right Symptoms  Normal      Horizontal Canal Left   Horizontal Canal Left Duration  none    Horizontal Canal Left Symptoms  Normal          Objective measurements completed on examination: See above findings.       Vestibular Treatment/Exercise - 05/24/18 0914      Vestibular Treatment/Exercise   Vestibular Treatment Provided  Canalith Repositioning    Canalith Repositioning  Epley Manuever Right       EPLEY MANUEVER RIGHT   Number of Reps   2    Overall Response  Improved Symptoms    Response Details   No nystagmus noted; patient still experienced some mild nausea with 2nd rep that settled within minutes.            PT Education - 05/24/18 1457    Education Details  nature of BPPV    Person(s) Educated  Patient    Methods  Explanation    Comprehension  Verbalized understanding       PT Short Term Goals - 05/24/18 1504      PT SHORT TERM  GOAL #1   Title  The patient will have negative testing for positional vertigo indicating resolution of bilateral BPPV  Target date 06/23/2018    Time  4    Period  Weeks    Target Date  06/23/18      PT SHORT TERM GOAL #2   Title  The patient will improve Berg balance score from 35/56 to > or equal to 40/56.    Time  8    Period  Weeks    Target Date  06/23/18      PT SHORT TERM GOAL #3   Title  The patient will return demo HEP mod indep.    Time  4    Period  Weeks    Target Date  06/23/18        PT Long Term Goals - 05/24/18 1502      PT LONG TERM GOAL #1   Title  The patient will be indep with progression of  HEP for post d/c plan [facility]?).  Target date 07/23/2018    Time  8    Period  Weeks    Target Date  07/23/18      PT LONG TERM GOAL #2   Title  The patient will improve Berg score from 35/56 to > or equal to 44/56 to demo improving balance for community mobility.    Time  8    Period  Weeks    Target Date  07/23/18      PT LONG TERM GOAL #3   Title  The patient will negotiate short community distances (500 ft) in grass, on pavement, negotiating curbs without loss of balance.    Time  8    Period  Weeks    Target Date  07/23/18      PT LONG TERM GOAL #4   Title  The patient will be able to bend to pick up objects from the floor without dizziness or loss of balance.    Time  8    Period  Weeks    Target Date  07/23/18             Plan - 05/24/18 1507    Clinical Impression Statement  The patient is a 85 year old male presenting to OP physical therapy s/p concussion due to a fall in 10/2017 noting ongoing dizziness, imbalance.  He scores in a high fall risk category on Berg balance test and notes 2 falls in the past 6 months.  The patient has bilateral BPPV with right side more severe then left based on subjective report and duration + severity of nystagmus.    He responded well to treatment today.  PT to reassess BPPV and focus on safety with mobility to  reduce falls.    History and Personal Factors relevant to plan of care:  asthma and HTN, s/p concussion, multiple falls    Clinical Presentation  Unstable    Clinical Presentation due to:  due to fall risk    Clinical Decision Making  Moderate    Rehab Potential  Good    PT Frequency  2x / week    PT Duration  8 weeks    PT Treatment/Interventions  ADLs/Self Care Home Management;Therapeutic activities;Therapeutic exercise;Balance training;Neuromuscular re-education;Gait training;Stair training;Functional mobility training;Patient/family education;Canalith Repostioning;Vestibular;Manual techniques    PT Next Visit Plan  Reassess BPPV (was bilat prior to treatment today) and treat as needed; establish [place] HEP; work on safety with stair negotiation; gait training, balance activities.    Consulted and Agree with Plan of Care  Patient       Patient will benefit from skilled therapeutic intervention in order to improve the following deficits and impairments:  Abnormal gait, Dizziness, Decreased activity tolerance, Decreased balance, Decreased mobility, Decreased strength, Postural dysfunction, Impaired flexibility, Decreased safety awareness  Visit Diagnosis: Other abnormalities of gait and mobility  BPPV (benign paroxysmal positional vertigo), bilateral  Unsteadiness on feet     Problem List Patient Active Problem List   Diagnosis Date Noted  . 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

## 2018-05-28 ENCOUNTER — Ambulatory Visit: Payer: Medicare Other | Attending: Neurology | Admitting: Rehabilitative and Restorative Service Providers"

## 2018-05-28 ENCOUNTER — Encounter: Payer: Self-pay | Admitting: Rehabilitative and Restorative Service Providers"

## 2018-05-28 DIAGNOSIS — R2681 Unsteadiness on feet: Secondary | ICD-10-CM | POA: Insufficient documentation

## 2018-05-28 DIAGNOSIS — H8113 Benign paroxysmal vertigo, bilateral: Secondary | ICD-10-CM | POA: Diagnosis present

## 2018-05-28 DIAGNOSIS — R2689 Other abnormalities of gait and mobility: Secondary | ICD-10-CM | POA: Diagnosis not present

## 2018-05-28 NOTE — Therapy (Signed)
[facility] [address] [place], [place], [address] Phone: [phone]   Fax:  [phone]  Physical Therapy Treatment  Patient Details  Name: Guy Espinoza MRN: [id number] Date of Birth: 01/28/1927 Referring Provider (PT): Lesly Dukes, MD   Encounter Date: 05/28/2018  PT End of Session - 05/28/18 2014    Visit Number  2    Number of Visits  17    Date for PT Re-Evaluation  07/23/18    Authorization Type  *check insurance authorization    PT Start Time  1325    PT Stop Time  1401    PT Time Calculation (min)  36 min    Equipment Utilized During Treatment  Gait belt    Activity Tolerance  Patient tolerated treatment well    Behavior During Therapy  [facility] for tasks assessed/performed       Past Medical History:  Diagnosis Date  . Asthma   . Hypertension     Past Surgical History:  Procedure Laterality Date  . CATARACT EXTRACTION Left 2007  . double hernia repair  2010  . HERNIA REPAIR  1984  . KNEE SURGERY Left 1992  . ROTATOR CUFF REPAIR Left 2001  . ruptured blood vessel L eye Left 2008    There were no vitals filed for this visit.  Subjective Assessment - 05/28/18 1322    Subjective  The patient notes "no more flittering of the lights" when asked about positional vertigo.  He notes his balance seemed better the first couple of days, but yesterday and today he is unsteady.      Pertinent History  asthma and HTN    Patient Stated Goals  Reduce dizziness, improve balance    Currently in Pain?  No/denies             Vestibular Assessment - 05/28/18 1328      Positional Testing   Sidelying Test  Sidelying Right;Sidelying Left      Sidelying Right   Sidelying Right Duration  10 seconds    Sidelying Right Symptoms  Upbeat, right rotatory nystagmus      Sidelying Left   Sidelying Left Duration  trace, unable to distinguish    Sidelying Left Symptoms  No nystagmus                 Vestibular Treatment/Exercise - 05/28/18 1329      Vestibular Treatment/Exercise   Vestibular Treatment Provided  Habituation;Canalith Repositioning    Canalith Repositioning  Epley Manuever Right;Canal Roll Right    Habituation Exercises  Brandt Daroff       EPLEY MANUEVER RIGHT   Number of Reps   2    Overall Response  Symptoms Worsened    Response Details   On first rep, during first position, patient has 18 second duration of upbeat, right rotary nystagmus.  On 2nd rep, brought patient into first position and noted a R beating horizontal nystagmus indicating repositioning into horz canal (most likely due to limited neck ROM and overall mobility limitations).      Canal Roll Right   Number of Reps   1    Overall Response   Symptoms Resolved    Response Details   Modified due to being unable to achieve prone position.  Ended with patient rolling as far to the left as he could and pointing nose towards the floor.  Attempted a 2nd roll and no nystagmus noted when rolling to the right.  Austin Miles   Number of Reps   2    Symptom Description   Patient only had trace nystagmus on 2nd repetitions.          Balance Exercises - 05/28/18 1331      OTAGO PROGRAM   Head Movements  Standing;5 reps    Knee Flexor  10 reps    Hip ABductor  10 reps    Ankle Plantorflexors  20 reps, support    Ankle Dorsiflexors  20 reps, support    Knee Bends  10 reps, support    Sideways Walking  Assistive device    One Leg Stand  10 seconds, support    Overall OTAGO Comments  Provided a portion of these exercises for home including:  neck AROM, mini squats, marching, ankle PF, ankle DF.        PT Education - 05/28/18 1349    Education Details  neck AROM, mini squats, marching, ankle PF, ankle DF.    Person(s) Educated  Patient    Methods  Explanation;Demonstration;Handout    Comprehension  Verbalized understanding;Returned demonstration       PT Short Term Goals  - 05/24/18 1504      PT SHORT TERM GOAL #1   Title  The patient will have negative testing for positional vertigo indicating resolution of bilateral BPPV  Target date 06/23/2018    Time  4    Period  Weeks    Target Date  06/23/18      PT SHORT TERM GOAL #2   Title  The patient will improve Berg balance score from 35/56 to > or equal to 40/56.    Time  8    Period  Weeks    Target Date  06/23/18      PT SHORT TERM GOAL #3   Title  The patient will return demo HEP mod indep.    Time  4    Period  Weeks    Target Date  06/23/18        PT Long Term Goals - 05/24/18 1502      PT LONG TERM GOAL #1   Title  The patient will be indep with progression of HEP for post d/c plan [facility]?).  Target date 07/23/2018    Time  8    Period  Weeks    Target Date  07/23/18      PT LONG TERM GOAL #2   Title  The patient will improve Berg score from 35/56 to > or equal to 44/56 to demo improving balance for community mobility.    Time  8    Period  Weeks    Target Date  07/23/18      PT LONG TERM GOAL #3   Title  The patient will negotiate short community distances (500 ft) in grass, on pavement, negotiating curbs without loss of balance.    Time  8    Period  Weeks    Target Date  07/23/18      PT LONG TERM GOAL #4   Title  The patient will be able to bend to pick up objects from the floor without dizziness or loss of balance.    Time  8    Period  Weeks    Target Date  07/23/18            Plan - 05/28/18 2021    Clinical Impression Statement  The patient continued with shorter duration, upbeating nystgmus today with R sidelying  and R hallpike testing.    During treatment, the patient's nystagmus changed to a horizontal, geotropic nystagmus indicating a repositioning of the otoconia into the horizontal canal.  The patient tolerated treatment well.  HEP for balance was initiated.    PT Treatment/Interventions  ADLs/Self Care Home Management;Therapeutic activities;Therapeutic  exercise;Balance training;Neuromuscular re-education;Gait training;Stair training;Functional mobility training;Patient/family education;Canalith Repostioning;Vestibular;Manual techniques    PT Next Visit Plan  Reassess BPPV, check HEP, gait training, balance activities, reaching to floor and return to stand (reported difficulty with this at evaluation)    Consulted and Agree with Plan of Care  Patient       Patient will benefit from skilled therapeutic intervention in order to improve the following deficits and impairments:  Abnormal gait, Dizziness, Decreased activity tolerance, Decreased balance, Decreased mobility, Decreased strength, Postural dysfunction, Impaired flexibility, Decreased safety awareness  Visit Diagnosis: Other abnormalities of gait and mobility  BPPV (benign paroxysmal positional vertigo), bilateral  Unsteadiness on feet     Problem List Patient Active Problem List   Diagnosis Date Noted  . 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 [address] [place], [place], [address] Phone: [phone]   Fax:  [phone]  Name: Guy Espinoza MRN: [id number] Date of Birth: 04/30/1926

## 2018-06-01 ENCOUNTER — Encounter: Payer: Self-pay | Admitting: Physical Therapy

## 2018-06-01 ENCOUNTER — Ambulatory Visit: Payer: Medicare Other | Admitting: Physical Therapy

## 2018-06-01 DIAGNOSIS — R2689 Other abnormalities of gait and mobility: Secondary | ICD-10-CM

## 2018-06-01 DIAGNOSIS — H8113 Benign paroxysmal vertigo, bilateral: Secondary | ICD-10-CM

## 2018-06-01 DIAGNOSIS — R2681 Unsteadiness on feet: Secondary | ICD-10-CM

## 2018-06-01 NOTE — Therapy (Signed)
[facility] [address] [place], [place], [address] Phone: [phone]   Fax:  [phone]  Physical Therapy Treatment  Patient Details  Name: Guy Espinoza MRN: [id number] Date of Birth: 21-Oct-1926 Referring Provider (PT): Lesly Dukes, MD   Encounter Date: 06/01/2018  PT End of Session - 06/01/18 1017    Visit Number  3    Number of Visits  17    Date for PT Re-Evaluation  07/23/18    Authorization Type  *check insurance authorization    PT Start Time  1017    PT Stop Time  1057    PT Time Calculation (min)  40 min    Equipment Utilized During Treatment  Gait belt    Activity Tolerance  Patient tolerated treatment well    Behavior During Therapy  [facility] for tasks assessed/performed       Past Medical History:  Diagnosis Date  . Asthma   . Hypertension     Past Surgical History:  Procedure Laterality Date  . CATARACT EXTRACTION Left 2007  . double hernia repair  2010  . HERNIA REPAIR  1984  . KNEE SURGERY Left 1992  . ROTATOR CUFF REPAIR Left 2001  . ruptured blood vessel L eye Left 2008    There were no vitals filed for this visit.  Subjective Assessment - 06/01/18 1018    Subjective  Has not been dizzy more than normal; "I'm just feeling weak in the knees"    Pertinent History  asthma and HTN    Patient Stated Goals  Reduce dizziness, improve balance    Currently in Pain?  No/denies    Pain Score  0-No pain                        Vestibular Treatment/Exercise - 06/01/18 0001      Vestibular Treatment/Exercise   Vestibular Treatment Provided  Canalith Repositioning    Canalith Repositioning  Epley Manuever Right       EPLEY MANUEVER RIGHT   Number of Reps   2    Overall Response  Improved Symptoms    Response Details   First rep - symptoms for 5-6 seconds with initial positioning, symptoms again with sidelying, then all symptoms resolved; no symptoms on 2nd rep          Balance Exercises - 06/01/18 1431      Balance Exercises: Standing   Standing Eyes Opened  Narrow base of support (BOS);2 reps;30 secs;Head turns    Standing Eyes Closed  Narrow base of support (BOS);Wide (BOA);3 reps;20 secs    Tandem Stance  Eyes open;Intermittent upper extremity support;3 reps   bilateral - modified tandem (not full)   Tandem Gait  Forward;Upper extremity support;4 reps   at counter   Retro Gait  Upper extremity support;4 reps   at counter     OTAGO PROGRAM   Hip ABductor  20 reps   at counter   Ankle Plantorflexors  20 reps, support          PT Short Term Goals - 05/24/18 1504      PT SHORT TERM GOAL #1   Title  The patient will have negative testing for positional vertigo indicating resolution of bilateral BPPV  Target date 06/23/2018    Time  4    Period  Weeks    Target Date  06/23/18      PT SHORT TERM GOAL #2  Title  The patient will improve Berg balance score from 35/56 to > or equal to 40/56.    Time  8    Period  Weeks    Target Date  06/23/18      PT SHORT TERM GOAL #3   Title  The patient will return demo HEP mod indep.    Time  4    Period  Weeks    Target Date  06/23/18        PT Long Term Goals - 05/24/18 1502      PT LONG TERM GOAL #1   Title  The patient will be indep with progression of HEP for post d/c plan [facility]?).  Target date 07/23/2018    Time  8    Period  Weeks    Target Date  07/23/18      PT LONG TERM GOAL #2   Title  The patient will improve Berg score from 35/56 to > or equal to 44/56 to demo improving balance for community mobility.    Time  8    Period  Weeks    Target Date  07/23/18      PT LONG TERM GOAL #3   Title  The patient will negotiate short community distances (500 ft) in grass, on pavement, negotiating curbs without loss of balance.    Time  8    Period  Weeks    Target Date  07/23/18      PT LONG TERM GOAL #4   Title  The patient will be able to bend to pick up objects from the  floor without dizziness or loss of balance.    Time  8    Period  Weeks    Target Date  07/23/18            Plan - 06/01/18 1042    Clinical Impression Statement  Re-assessment of BPPV today with positive R Dix-Hallpike with subjective symptoms and nystagmus lasting ~5-6 seconds. All symptoms resolved following Epley maneuver - did perform x2 to ensure all symptoms resolved. Remained of session spent on balance activities with greatest difficulty with narrow BOS and EC activities. Will continue to progess towards established goals.     PT Treatment/Interventions  ADLs/Self Care Home Management;Therapeutic activities;Therapeutic exercise;Balance training;Neuromuscular re-education;Gait training;Stair training;Functional mobility training;Patient/family education;Canalith Repostioning;Vestibular;Manual techniques    PT Next Visit Plan  Reassess BPPV, check HEP, gait training, balance activities, reaching to floor and return to stand (reported difficulty with this at evaluation)    Consulted and Agree with Plan of Care  Patient       Patient will benefit from skilled therapeutic intervention in order to improve the following deficits and impairments:  Abnormal gait, Dizziness, Decreased activity tolerance, Decreased balance, Decreased mobility, Decreased strength, Postural dysfunction, Impaired flexibility, Decreased safety awareness  Visit Diagnosis: Other abnormalities of gait and mobility  BPPV (benign paroxysmal positional vertigo), bilateral  Unsteadiness on feet     Problem List Patient Active Problem List   Diagnosis Date Noted  . 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 [place], [place], [address] Phone: [phone]   Fax:  [phone]  Name: Guy Espinoza MRN: [id number] Date of Birth: 1926/06/08

## 2018-06-07 ENCOUNTER — Other Ambulatory Visit: Payer: Self-pay

## 2018-06-07 ENCOUNTER — Ambulatory Visit: Payer: Medicare Other | Admitting: Physical Therapy

## 2018-06-07 ENCOUNTER — Encounter: Payer: Self-pay | Admitting: Physical Therapy

## 2018-06-07 DIAGNOSIS — H8113 Benign paroxysmal vertigo, bilateral: Secondary | ICD-10-CM

## 2018-06-07 DIAGNOSIS — R2689 Other abnormalities of gait and mobility: Secondary | ICD-10-CM | POA: Diagnosis not present

## 2018-06-07 DIAGNOSIS — R2681 Unsteadiness on feet: Secondary | ICD-10-CM

## 2018-06-07 NOTE — Patient Instructions (Signed)
Given Brandt-Daroff handout with instructions to only lie to his right side (due to left shoulder pain)

## 2018-06-07 NOTE — Therapy (Signed)
[facility] [address] [place], [place], [address] Phone: [phone]   Fax:  [phone]  Physical Therapy Treatment  Patient Details  Name: Guy Espinoza MRN: [id number] Date of Birth: [birth year] Referring Provider (PT): Lesly Dukes, MD   Encounter Date: 06/07/2018  PT End of Session - 06/07/18 2006    Visit Number  4    Number of Visits  17    Date for PT Re-Evaluation  07/23/18    Authorization Type  *check insurance authorization    PT Start Time  0803    PT Stop Time  0846    PT Time Calculation (min)  43 min    Equipment Utilized During Treatment  Gait belt    Activity Tolerance  Patient tolerated treatment well    Behavior During Therapy  [facility] for tasks assessed/performed       Past Medical History:  Diagnosis Date  . Asthma   . Hypertension     Past Surgical History:  Procedure Laterality Date  . CATARACT EXTRACTION Left 2007  . double hernia repair  2010  . HERNIA REPAIR  1984  . KNEE SURGERY Left 1992  . ROTATOR CUFF REPAIR Left 2001  . ruptured blood vessel L eye Left 2008    There were no vitals filed for this visit.  Subjective Assessment - 06/07/18 1951    Subjective  Was feeling better, but woke up today at 3 am to go to the bathroom and was very unsteady, felt dizzy. Happened again when got up at 6 am    Pertinent History  asthma and HTN    Patient Stated Goals  Reduce dizziness, improve balance    Currently in Pain?  No/denies             Vestibular Assessment - 06/07/18 0001      Vestibular Assessment   General Observation  very short, choppy steps with arms in guarded position       Symptom Behavior   Type of Dizziness   "Funny feeling in head"    Duration of Dizziness  Imbalance when on his feet and spinning intermittently that lasts for seconds.    Aggravating Factors  Lying supine;Turning head quickly;Supine to sit;Sit to stand      Oculomotor Exam    Oculomotor Alignment  Normal    Spontaneous  Absent    Smooth Pursuits  Intact      Vestibulo-Ocular Reflex   VOR 1 Head Only (x 1 viewing)  slow VOR does not provoke dizziness.      Dix-Hallpike Right   Dix-Hallpike Right Duration  18 sec    Dix-Hallpike Right Symptoms  Upbeat, right rotatory nystagmus      Sidelying Right   Sidelying Right Duration  0    Sidelying Right Symptoms  No nystagmus   after epley               Vestibular Treatment/Exercise - 06/07/18 0001      Vestibular Treatment/Exercise   Vestibular Treatment Provided  Canalith Repositioning    Canalith Repositioning  Epley Manuever Right       EPLEY MANUEVER RIGHT   Number of Reps   2    Overall Response  Improved Symptoms    Response Details   First rep - symptoms in each position; 2nd rep done on inverted table with no symptoms      Austin Miles   Number of Reps   1  Symptom Description   had felt better after Epley; on return to sit he felt worse and was more unsteady when standing walking        After Epley, but before Brandt-Daroff: Corner balance activities--solid surface, feet hip width, EO, head turns and nods; EC head stationary At counter with light UE support-slow march, tandem walk, backwards walk,      PT Education - 06/07/18 2003    Education Details  Brandt-daroff    Person(s) Educated  Patient    Methods  Explanation;Demonstration;Handout;Tactile cues;Verbal cues    Comprehension  Verbalized understanding;Returned demonstration;Verbal cues required;Tactile cues required       PT Short Term Goals - 05/24/18 1504      PT SHORT TERM GOAL #1   Title  The patient will have negative testing for positional vertigo indicating resolution of bilateral BPPV  Target date 06/23/2018    Time  4    Period  Weeks    Target Date  06/23/18      PT SHORT TERM GOAL #2   Title  The patient will improve Berg balance score from 35/56 to > or equal to 40/56.    Time  8    Period  Weeks     Target Date  06/23/18      PT SHORT TERM GOAL #3   Title  The patient will return demo HEP mod indep.    Time  4    Period  Weeks    Target Date  06/23/18        PT Long Term Goals - 05/24/18 1502      PT LONG TERM GOAL #1   Title  The patient will be indep with progression of HEP for post d/c plan [facility]?).  Target date 07/23/2018    Time  8    Period  Weeks    Target Date  07/23/18      PT LONG TERM GOAL #2   Title  The patient will improve Berg score from 35/56 to > or equal to 44/56 to demo improving balance for community mobility.    Time  8    Period  Weeks    Target Date  07/23/18      PT LONG TERM GOAL #3   Title  The patient will negotiate short community distances (500 ft) in grass, on pavement, negotiating curbs without loss of balance.    Time  8    Period  Weeks    Target Date  07/23/18      PT LONG TERM GOAL #4   Title  The patient will be able to bend to pick up objects from the floor without dizziness or loss of balance.    Time  8    Period  Weeks    Target Date  07/23/18            Plan - 06/07/18 2007    Clinical Impression Statement  Re-assessment of rt posterior BPPV with positive Rt Dix-hallpike. Treated with rt Epley x 2 (2nd time on inverted table to improve alignment of canal into 30 degrees below horizontal) with symptoms resolved. Continued with balance exercises and gait with pt more steady than when he entered clinic. Instructed in Brandt-Daroff to his right (no nystagmus seen) however pt reported spinnning on return to sit (no nystagmus). Upon standing to exit gym, he was again very unsteady and assisted to sit upright in a chair in the gym for another 10-15 minutes for observation and  prior to attempting to walk out. Wife brought back to sit with patient. After 20 minutes, pt observed to stand and walk out of gym with close-guarding by another therapist.     PT Treatment/Interventions  ADLs/Self Care Home Management;Therapeutic  activities;Therapeutic exercise;Balance training;Neuromuscular re-education;Gait training;Stair training;Functional mobility training;Patient/family education;Canalith Repostioning;Vestibular;Manual techniques    PT Next Visit Plan  Has he done brandt-daroff and how is it going? Reassess BPPV (?use frenzels), gait training, balance activities, reaching to floor and return to stand (reported difficulty with this at evaluation); add other habituation to HEP??    Consulted and Agree with Plan of Care  Patient       Patient will benefit from skilled therapeutic intervention in order to improve the following deficits and impairments:  Abnormal gait, Dizziness, Decreased activity tolerance, Decreased balance, Decreased mobility, Decreased strength, Postural dysfunction, Impaired flexibility, Decreased safety awareness  Visit Diagnosis: BPPV (benign paroxysmal positional vertigo), bilateral  Unsteadiness on feet  Other abnormalities of gait and mobility     Problem List Patient Active Problem List   Diagnosis Date Noted  . 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

## 2018-06-08 ENCOUNTER — Ambulatory Visit: Payer: Medicare Other | Admitting: Physical Therapy

## 2018-06-08 ENCOUNTER — Encounter

## 2018-06-08 DIAGNOSIS — R2689 Other abnormalities of gait and mobility: Secondary | ICD-10-CM | POA: Diagnosis not present

## 2018-06-08 DIAGNOSIS — R2681 Unsteadiness on feet: Secondary | ICD-10-CM

## 2018-06-08 DIAGNOSIS — H8113 Benign paroxysmal vertigo, bilateral: Secondary | ICD-10-CM

## 2018-06-08 NOTE — Therapy (Signed)
[facility] [address] [place], [place], [address] Phone: [phone]   Fax:  [phone]  Physical Therapy Treatment  Patient Details  Name: Guy Espinoza MRN: [id number] Date of Birth: 04/27/1926 Referring Provider (PT): Lesly Dukesharles Keith Willis, MD   Encounter Date: 06/08/2018  PT End of Session - 06/08/18 1542    Visit Number  5    Number of Visits  17    Date for PT Re-Evaluation  07/23/18    Authorization Type  *check insurance authorization    PT Start Time  1008    PT Stop Time  1100    PT Time Calculation (min)  52 min    Equipment Utilized During Treatment  Gait belt    Activity Tolerance  Patient tolerated treatment well    Behavior During Therapy  [facility] for tasks assessed/performed       Past Medical History:  Diagnosis Date  . Asthma   . Hypertension     Past Surgical History:  Procedure Laterality Date  . CATARACT EXTRACTION Left 2007  . double hernia repair  2010  . HERNIA REPAIR  1984  . KNEE SURGERY Left 1992  . ROTATOR CUFF REPAIR Left 2001  . ruptured blood vessel L eye Left 2008    There were no vitals filed for this visit.  Subjective Assessment - 06/08/18 1537    Subjective  reports difficulty getting up from floor yesterday; still experience unsteadiness and dizziness    Pertinent History  asthma and HTN    Patient Stated Goals  Reduce dizziness, improve balance    Currently in Pain?  No/denies    Pain Score  0-No pain             Vestibular Assessment - 06/08/18 0001      Vestibular Assessment   General Observation  very short, choppy steps with arms in guarded position       Symptom Behavior   Duration of Dizziness  Imbalance when on his feet and spinning intermittently that lasts for seconds.      Dix-Hallpike Right   Dix-Hallpike Right Duration  0    Dix-Hallpike Right Symptoms  No nystagmus      Dix-Hallpike Left   Dix-Hallpike Left Duration  15-30 seconds    Dix-Hallpike Left Symptoms  Upbeat, left rotatory nystagmus      Horizontal Canal Right   Horizontal Canal Right Duration  none    Horizontal Canal Right Symptoms  Normal      Horizontal Canal Left   Horizontal Canal Left Duration  none    Horizontal Canal Left Symptoms  Normal               [facility] Adult PT Treatment/Exercise - 06/08/18 0001      Ambulation/Gait   Ambulation/Gait  Yes    Ambulation/Gait Assistance  4: Min guard;3: Mod assist    Ambulation/Gait Assistance Details  gait around gym with attempted head turns - immediate LOB wiht up to Mod A to maintain balance    Ambulation Distance (Feet)  115 Feet    Assistive device  None    Gait Pattern  Decreased stride length;Decreased dorsiflexion - right;Decreased dorsiflexion - left;Narrow base of support;Trunk flexed    Ambulation Surface  Level;Indoor    Gait Comments  --      Vestibular Treatment/Exercise - 06/08/18 0001      Vestibular Treatment/Exercise   Vestibular Treatment Provided  Canalith Repositioning    Canalith Repositioning  Epley Manuever Left       EPLEY MANUEVER LEFT   Number of Reps   3    Overall Response   Improved Symptoms     RESPONSE DETAILS LEFT  symptoms with each position; provoked nausea      Austin Miles   Number of Reps   2    Symptom Description   symptoms going towards L - provoked nausea              PT Short Term Goals - 05/24/18 1504      PT SHORT TERM GOAL #1   Title  The patient will have negative testing for positional vertigo indicating resolution of bilateral BPPV  Target date 06/23/2018    Time  4    Period  Weeks    Target Date  06/23/18      PT SHORT TERM GOAL #2   Title  The patient will improve Berg balance score from 35/56 to > or equal to 40/56.    Time  8    Period  Weeks    Target Date  06/23/18      PT SHORT TERM GOAL #3   Title  The patient will return demo HEP mod indep.    Time  4    Period  Weeks    Target Date  06/23/18        PT  Long Term Goals - 05/24/18 1502      PT LONG TERM GOAL #1   Title  The patient will be indep with progression of HEP for post d/c plan [facility]?).  Target date 07/23/2018    Time  8    Period  Weeks    Target Date  07/23/18      PT LONG TERM GOAL #2   Title  The patient will improve Berg score from 35/56 to > or equal to 44/56 to demo improving balance for community mobility.    Time  8    Period  Weeks    Target Date  07/23/18      PT LONG TERM GOAL #3   Title  The patient will negotiate short community distances (500 ft) in grass, on pavement, negotiating curbs without loss of balance.    Time  8    Period  Weeks    Target Date  07/23/18      PT LONG TERM GOAL #4   Title  The patient will be able to bend to pick up objects from the floor without dizziness or loss of balance.    Time  8    Period  Weeks    Target Date  07/23/18            Plan - 06/08/18 1542    Clinical Impression Statement  Patient today with continued reports of dizziness and unsteadiness with gait. With positional vertigo patient negative on R side, however now with positive findings on L side with upbeat, rotary with patient subjective reports of spinning. Of note, with Brandt-Daroff, no symptoms with going to the R, however with nystagmus and symptoms with L side. PT performing Epley to L side x 2. Patient with great instability upon walking with reports of nausea - required up to Mod A from PT with gait to prevent fall - had 2nd PT, [employer] assess patient with continued L posterior canal BPPV and PT performing L Epley maneuver. Patient given RW to take home over the weekend for safety with gait. Patient escorted  from gym with PT for safety.    PT Treatment/Interventions  ADLs/Self Care Home Management;Therapeutic activities;Therapeutic exercise;Balance training;Neuromuscular re-education;Gait training;Stair training;Functional mobility training;Patient/family education;Canalith  Repostioning;Vestibular;Manual techniques    PT Next Visit Plan  Has he done brandt-daroff and how is it going? Reassess BPPV (?use frenzels), gait training, balance activities, reaching to floor and return to stand (reported difficulty with this at evaluation); add other habituation to HEP??    Consulted and Agree with Plan of Care  Patient       Patient will benefit from skilled therapeutic intervention in order to improve the following deficits and impairments:  Abnormal gait, Dizziness, Decreased activity tolerance, Decreased balance, Decreased mobility, Decreased strength, Postural dysfunction, Impaired flexibility, Decreased safety awareness  Visit Diagnosis: BPPV (benign paroxysmal positional vertigo), bilateral  Unsteadiness on feet  Other abnormalities of gait and mobility     Problem List Patient Active Problem List   Diagnosis Date Noted  . 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

## 2018-06-11 ENCOUNTER — Ambulatory Visit: Payer: Medicare Other | Admitting: Physical Therapy

## 2018-06-11 DIAGNOSIS — R2689 Other abnormalities of gait and mobility: Secondary | ICD-10-CM | POA: Diagnosis not present

## 2018-06-11 DIAGNOSIS — R2681 Unsteadiness on feet: Secondary | ICD-10-CM

## 2018-06-11 DIAGNOSIS — H8113 Benign paroxysmal vertigo, bilateral: Secondary | ICD-10-CM

## 2018-06-12 NOTE — Therapy (Signed)
[facility] [address] [place], [place], [address] Phone: [phone]   Fax:  [phone]  Physical Therapy Treatment  Patient Details  Name: Guy Espinoza MRN: [id number] Date of Birth: 1926/10/04 Referring Provider (PT): Lesly Dukes, MD   Encounter Date: 06/11/2018  PT End of Session - 06/12/18 1630    Visit Number  6    Number of Visits  17    Date for PT Re-Evaluation  07/23/18    Authorization Type  [employer] Medicare    PT Start Time  1017    PT Stop Time  1101    PT Time Calculation (min)  44 min       Past Medical History:  Diagnosis Date  . Asthma   . Hypertension     Past Surgical History:  Procedure Laterality Date  . CATARACT EXTRACTION Left 2007  . double hernia repair  2010  . HERNIA REPAIR  1984  . KNEE SURGERY Left 1992  . ROTATOR CUFF REPAIR Left 2001  . ruptured blood vessel L eye Left 2008    There were no vitals filed for this visit.  Subjective Assessment - 06/12/18 1620    Subjective  Pt reports he brought the rolling walker back that they let him borrow on Friday due to him being so unsteady after treatment; pt states he does not need it now - says vertigo is some better but states he had a really bad episode on Sat. night when he bent over for period of time to plug in a nightlight - says he he trouble finding the outlet and stayed bent over for a little while and was extremely dizzy when he stood up; states he just hurried up and got in bed for it to settle    Pertinent History  asthma and HTN    Patient Stated Goals  Reduce dizziness, improve balance    Currently in Pain?  No/denies             Vestibular Assessment - 06/12/18 0001      Positional Testing   Dix-Hallpike  Dix-Hallpike Right;Dix-Hallpike Left      Dix-Hallpike Right   Dix-Hallpike Right Duration  approx. 10 secs    Dix-Hallpike Right Symptoms  Upbeat, right rotatory nystagmus      Dix-Hallpike  Left   Dix-Hallpike Left Duration  none    Dix-Hallpike Left Symptoms  No nystagmus       Epley maneuver for Rt BPPV completed 3 reps; improved symptoms with no nystagmus noted on 3rd rep        [facility] Adult PT Treatment/Exercise - 06/12/18 0001      Ambulation/Gait   Ambulation/Gait  Yes    Ambulation/Gait Assistance  4: Min guard    Ambulation/Gait Assistance Details  pt requested to amb. around track after Epley maneuver to see how steady he was    Ambulation Distance (Feet)  350 Feet    Assistive device  None    Gait Pattern  Decreased stride length;Decreased dorsiflexion - right;Step-through pattern    Ambulation Surface  Level;Indoor          Balance Exercises - 06/12/18 1625      Balance Exercises: Standing   Other Standing Exercises  Pt performed standing forward, back and side kicks 10 reps each leg - alternating - at counter - for UE support prn for balance:  marching in place 10 reps each leg with UE support prn  PT Education - 06/12/18 1628    Education Details  Medbridge balance exs - [id number] - marching, forward, back and side kicks at counter    Person(s) Educated  Patient    Methods  Explanation;Demonstration;Handout    Comprehension  Returned demonstration;Verbalized understanding       PT Short Term Goals - 05/24/18 1504      PT SHORT TERM GOAL #1   Title  The patient will have negative testing for positional vertigo indicating resolution of bilateral BPPV  Target date 06/23/2018    Time  4    Period  Weeks    Target Date  06/23/18      PT SHORT TERM GOAL #2   Title  The patient will improve Berg balance score from 35/56 to > or equal to 40/56.    Time  8    Period  Weeks    Target Date  06/23/18      PT SHORT TERM GOAL #3   Title  The patient will return demo HEP mod indep.    Time  4    Period  Weeks    Target Date  06/23/18        PT Long Term Goals - 05/24/18 1502      PT LONG TERM GOAL #1   Title  The patient will be  indep with progression of HEP for post d/c plan [facility]?).  Target date 07/23/2018    Time  8    Period  Weeks    Target Date  07/23/18      PT LONG TERM GOAL #2   Title  The patient will improve Berg score from 35/56 to > or equal to 44/56 to demo improving balance for community mobility.    Time  8    Period  Weeks    Target Date  07/23/18      PT LONG TERM GOAL #3   Title  The patient will negotiate short community distances (500 ft) in grass, on pavement, negotiating curbs without loss of balance.    Time  8    Period  Weeks    Target Date  07/23/18      PT LONG TERM GOAL #4   Title  The patient will be able to bend to pick up objects from the floor without dizziness or loss of balance.    Time  8    Period  Weeks    Target Date  07/23/18            Plan - 06/11/18 1022    Clinical Impression Statement  Pt had no symptoms of Rt BPPV (no c/o vertigo and no nystagmus noted) on 3rd rep of Epley maneuver; pt amb. 3 laps around track after completion of Epley maneuvers and performed horizontal head turns for approx. 5 reps with some mild unsteadiness but no major LOB    unable to do Brandt-Daroff exercises to left side due to shoulder pain   Rehab Potential  Good    PT Frequency  2x / week    PT Duration  8 weeks    PT Treatment/Interventions  ADLs/Self Care Home Management;Therapeutic activities;Therapeutic exercise;Balance training;Neuromuscular re-education;Gait training;Stair training;Functional mobility training;Patient/family education;Canalith Repostioning;Vestibular;Manual techniques    PT Next Visit Plan  Reassess BPPV prn (Rt BPPV on 06-10-17); continue balance and gait training    PT Home Exercise Plan  Medbridge [id number]    Consulted and Agree with Plan of Care  Patient  Patient will benefit from skilled therapeutic intervention in order to improve the following deficits and impairments:  Abnormal gait, Dizziness, Decreased activity tolerance, Decreased  balance, Decreased mobility, Decreased strength, Postural dysfunction, Impaired flexibility, Decreased safety awareness  Visit Diagnosis: BPPV (benign paroxysmal positional vertigo), bilateral  Other abnormalities of gait and mobility  Unsteadiness on feet     Problem List Patient Active Problem List   Diagnosis Date Noted  . 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 [place], [place], [address] Phone: [phone]   Fax:  [phone]  Name: Guy Espinoza MRN: [id number] Date of Birth: 15-Aug-1926

## 2018-06-15 ENCOUNTER — Ambulatory Visit: Payer: Medicare Other | Admitting: Rehabilitative and Restorative Service Providers"

## 2018-06-18 ENCOUNTER — Ambulatory Visit: Payer: Medicare Other | Admitting: Physical Therapy

## 2018-06-19 ENCOUNTER — Telehealth: Payer: Self-pay | Admitting: Rehabilitative and Restorative Service Providers"

## 2018-06-19 NOTE — Telephone Encounter (Signed)
Adma Gaertner was contacted today regarding the temporary closing of [facility] due to Covid-19.  Therapist discussed:  Continuing current HEP and calling our clinic if symptoms worsen as we can make exception to see a limited caseload.   [facility] will follow up with patients when we are able to resume care.  Trayton Szabo, PT [facility] [address] [place], [place]  [address] Phone:  [phone] Fax:  [phone] \

## 2018-06-20 ENCOUNTER — Ambulatory Visit: Payer: Medicare Other | Admitting: Rehabilitative and Restorative Service Providers"

## 2018-06-25 ENCOUNTER — Encounter: Payer: Medicare Other | Admitting: Physical Therapy

## 2018-06-27 ENCOUNTER — Encounter: Payer: Medicare Other | Admitting: Rehabilitative and Restorative Service Providers"

## 2018-07-02 ENCOUNTER — Encounter: Payer: Medicare Other | Admitting: Physical Therapy

## 2018-07-04 ENCOUNTER — Encounter: Payer: Medicare Other | Admitting: Physical Therapy

## 2018-08-29 ENCOUNTER — Encounter: Payer: Self-pay | Admitting: Rehabilitative and Restorative Service Providers"

## 2018-08-29 ENCOUNTER — Other Ambulatory Visit: Payer: Self-pay

## 2018-08-29 ENCOUNTER — Ambulatory Visit: Payer: Medicare Other | Attending: Neurology | Admitting: Rehabilitative and Restorative Service Providers"

## 2018-08-29 DIAGNOSIS — R2689 Other abnormalities of gait and mobility: Secondary | ICD-10-CM | POA: Diagnosis not present

## 2018-08-29 DIAGNOSIS — R2681 Unsteadiness on feet: Secondary | ICD-10-CM

## 2018-08-29 NOTE — Therapy (Signed)
[facility] [address] [place], [place], [address] Phone: [phone]   Fax:  [phone]  Physical Therapy Treatment and Goal Update  Patient Details  Name: Guy Espinoza MRN: [id number] Date of Birth: [birth year] Referring Provider (PT): Lenor Coffin, MD   Encounter Date: 08/29/2018  PT End of Session - 08/29/18 0857    Visit Number  7    Number of Visits  17    Date for PT Re-Evaluation  10/13/18   wrote 4 week goals, but unable to begin until later in June for appropriate scheduling.  *Recomended patient only come in Fridays, early morning or late afternoon for best social distancing.   Authorization Type  [employer] Medicare    PT Start Time  0800    PT Stop Time  0845    PT Time Calculation (min)  45 min    Activity Tolerance  Patient tolerated treatment well    Behavior During Therapy  WFL for tasks assessed/performed       Past Medical History:  Diagnosis Date  . Asthma   . Hypertension     Past Surgical History:  Procedure Laterality Date  . CATARACT EXTRACTION Left 2007  . double hernia repair  2010  . HERNIA REPAIR  1984  . KNEE SURGERY Left 1992  . ROTATOR CUFF REPAIR Left 2001  . ruptured blood vessel L eye Left 2008    There were no vitals filed for this visit.  Subjective Assessment - 08/29/18 0802    Subjective  The patient reports that his left knee gives way at times.  "I can't climb a step with this leg."  (points to the left leg).  He is using a SPC to enter and is unsteady.  He reports this knee buckling has been going on for 3 weeks.     Pertinent History  asthma and HTN    Patient Stated Goals  Reduce dizziness, improve balance    Currently in Pain?  Yes    Pain Score  0-No pain   "my back hurts too"   Pain Descriptors / Indicators  Aching    Pain Onset  More than a month ago    Pain Frequency  Intermittent    Aggravating Factors   movement    Pain Relieving Factors  rest     Effect of Pain on Daily Activities  If he doesn't do much, and takes tylenol he has less pain.       CLINIC OPERATION CHANGES: [facility] is open at lower capacity following universal masking, social distancing, and patient screening.  The patient's COVID risk of complications score is 4. *Due to patient's age, PT recommended he schedule on Fridays, early morning, or late afternoon for slowest clinic times.   [facility] PT Assessment - 08/29/18 0805      ROM / Strength   AROM / PROM / Strength  Strength;AROM      AROM   Overall AROM   Deficits    Overall AROM Comments  L shoulder patient reports "I had a shoulder operation 20 years ago and it didn't turn out too good."   P[place]et moves from the L elbow, but does not move the L shoulder at all.  He cannot reach the right armpit with L hand to wash in the shower.      Strength   Overall Strength Comments  WFLs in R UE, bilat LEs.  Patient reports functional strength changes of difficulty  stepping up or down due to left leg weakness.  He denies sensory changes, but does note a h/o low back pain.      Standardized Balance Assessment   Standardized Balance Assessment  Berg Balance Test      Berg Balance Test   Sit to Stand  Able to stand  independently using hands    Standing Unsupported  Able to stand safely 2 minutes    Sitting with Back Unsupported but Feet Supported on Floor or Stool  Able to sit safely and securely 2 minutes    Stand to Sit  Controls descent by using hands    Transfers  Able to transfer safely, definite need of hands    Standing Unsupported with Eyes Closed  Able to stand 10 seconds safely    Standing Unsupported with Feet Together  Able to place feet together independently and stand for 1 minute with supervision    From Standing, Reach Forward with Outstretched Arm  Can reach forward >12 cm safely (5")    From Standing Position, Pick up Object from Floor  Able to pick up shoe, needs supervision    From Standing  Position, Turn to Look Behind Over each Shoulder  Turn sideways only but maintains balance    Turn 360 Degrees  Needs close supervision or verbal cueing    Standing Unsupported, Alternately Place Feet on Step/Stool  Able to complete >2 steps/needs minimal assist    Standing Unsupported, One Foot in Front  Able to take small step independently and hold 30 seconds    Standing on One Leg  Tries to lift leg/unable to hold 3 seconds but remains standing independently    Total Score  37    Berg comment:  37/56         Vestibular Assessment - 08/29/18 0805      Vestibular Assessment   General Observation  "I'm not letting the dizziness happen.  I'm not doing anything to cause that."      Positional Testing   Dix-Hallpike  Dix-Hallpike Right;Dix-Hallpike Left    Sidelying Test  Sidelying Right;Sidelying Left    Horizontal Canal Testing  Horizontal Canal Right;Horizontal Canal Left      Dix-Hallpike Right   Dix-Hallpike Right Duration  none    Dix-Hallpike Right Symptoms  No nystagmus      Dix-Hallpike Left   Dix-Hallpike Left Duration  none    Dix-Hallpike Left Symptoms  No nystagmus      Sidelying Right   Sidelying Right Duration  none    Sidelying Right Symptoms  No nystagmus      Sidelying Left   Sidelying Left Duration  none    Sidelying Left Symptoms  No nystagmus      Horizontal Canal Right   Horizontal Canal Right Duration  none    Horizontal Canal Right Symptoms  Normal      Horizontal Canal Left   Horizontal Canal Left Duration  none    Horizontal Canal Left Symptoms  Normal               [facility] Adult PT Treatment/Exercise - 08/29/18 0840      Ambulation/Gait   Ambulation/Gait  Yes    Ambulation/Gait Assistance  4: Min guard    Ambulation/Gait Assistance Details  Patient and PT worked on [place] using [place] with gait x 250 feet nonstop with cues on using R SPC to move during L leg swing phase.      Assistive device  Straight  cane    Gait Pattern   Decreased stride length;Decreased dorsiflexion - right;Step-through pattern    Ambulation Surface  Level;Indoor          Balance Exercises - 08/29/18 0830      [place] PROGRAM   Head Movements  Standing;5 reps    Trunk Movements  Standing;5 reps   with UE support   Ankle Movements  Sitting   5 reps   Knee Extensor  10 reps    Knee Flexor  10 reps    Hip ABductor  10 reps    Ankle Plantorflexors  --   10 reps with support   Ankle Dorsiflexors  --   10 reps with support   Knee Bends  10 reps, support    Overall OTAGO Comments  Also did standing marching x 60 seconds with UE support to warm up.  Provided cues for patient to hold counter and put L hand on counter as well (pain/tightness in L shoulder after using the garden tiller yesterday).           PT Short Term Goals - 08/29/18 0816      PT SHORT TERM GOAL #1   Title  The patient will have negative testing for positional vertigo indicating resolution of bilateral BPPV  Target date 06/23/2018    Time  4    Period  Weeks    Status  Achieved    Target Date  06/23/18      PT SHORT TERM GOAL #2   Title  The patient will improve Berg balance score from 35/56 to > or equal to 40/56.    Baseline  37/56.    Time  8    Period  Weeks    Status  Not Met    Target Date  06/23/18      PT SHORT TERM GOAL #3   Title  The patient will return demo HEP mod indep.    Baseline  provided habituation exercises prior to today; added balance program today.    Time  4    Period  Weeks    Status  Partially Met    Target Date  06/23/18        PT Long Term Goals - 08/29/18 7169      PT LONG TERM GOAL #1   Title  The patient will be indep with progression of HEP for post d/c plan [facility]?).  UPDATED TARGET DATE:  09/28/2018    Time  8    Period  Weeks    Status  Revised    Target Date  09/28/18      PT LONG TERM GOAL #2   Title  The patient will improve Berg score from 37/56 to > or equal to 42/56 to demo improving balance for  community mobility.    Time  8    Period  Weeks    Status  Revised      PT LONG TERM GOAL #3   Title  The patient will negotiate short community distances (500 ft) in grass, on pavement, negotiating curbs without loss of balance.    Time  8    Period  Weeks    Status  Revised      PT LONG TERM GOAL #4   Title  The patient will be able to bend to pick up objects from the floor without dizziness or loss of balance.    Time  8    Period  Weeks  Status  Revised            Plan - 08/29/18 0854    Clinical Impression Statement  The patient was negative today for BPPV.  He continues with unsteadiness and reports increased weakness of the left leg noting difficulties stepping up or down a curb with left leg.  MMT was symmetrical and he does not have any sensory changes in the L leg.  He does have h/o intermittent low back pain.  PT changed focus from treating vertigo to educating in [place] for fall prevention and strengthening.  PT to continue balance and strengthening training to improve safety and functional moiblity.      PT Frequency  2x / week    PT Duration  4 weeks    PT Treatment/Interventions  ADLs/Self Care Home Management;Therapeutic activities;Therapeutic exercise;Balance training;Neuromuscular re-education;Gait training;Stair training;Functional mobility training;Patient/family education;Canalith Repostioning;Vestibular;Manual techniques    PT Next Visit Plan  Check [place] HEP (patient has folder and documented in note);  balance training, gait training.    PT [place] [phone]    Consulted and Agree with Plan of Care  Patient       Patient will benefit from skilled therapeutic intervention in order to improve the following deficits and impairments:  Abnormal gait, Dizziness, Decreased activity tolerance, Decreased balance, Decreased mobility, Decreased strength, Postural dysfunction, Impaired flexibility, Decreased safety awareness  Visit  Diagnosis: No diagnosis found.     Problem List Patient Active Problem List   Diagnosis Date Noted  . 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 [place], [place], [address] Phone: [phone]   Fax:  [phone]  Name: Guy Espinoza MRN: [id number] Date of Birth: 1926-04-17

## 2018-09-13 ENCOUNTER — Other Ambulatory Visit: Payer: Self-pay

## 2018-09-13 ENCOUNTER — Ambulatory Visit: Payer: Medicare Other | Admitting: Physical Therapy

## 2018-09-13 DIAGNOSIS — R2689 Other abnormalities of gait and mobility: Secondary | ICD-10-CM | POA: Diagnosis not present

## 2018-09-13 DIAGNOSIS — R2681 Unsteadiness on feet: Secondary | ICD-10-CM

## 2018-09-13 NOTE — Patient Instructions (Signed)
Gastroc / Heel Cord Stretch - On Step    Stand with heels over edge of stair. Holding rail, lower heels until stretch is felt in calf of legs. Repeat 1_ times. Do _2__ times per day.  USE a 2" - 3" block - place at counter; ball of foot on edge with heel staying on floor; Hold for 20-30 secs  Copyright  V[employer]. All rights reserved.

## 2018-09-14 ENCOUNTER — Other Ambulatory Visit: Payer: Self-pay

## 2018-09-14 ENCOUNTER — Ambulatory Visit: Payer: Medicare Other | Admitting: Rehabilitative and Restorative Service Providers"

## 2018-09-14 ENCOUNTER — Encounter: Payer: Self-pay | Admitting: Rehabilitative and Restorative Service Providers"

## 2018-09-14 DIAGNOSIS — R2689 Other abnormalities of gait and mobility: Secondary | ICD-10-CM

## 2018-09-14 DIAGNOSIS — R2681 Unsteadiness on feet: Secondary | ICD-10-CM

## 2018-09-14 NOTE — Therapy (Signed)
[facility] [address] [place], [place], [address] Phone: [phone]   Fax:  [phone]  Physical Therapy Treatment  Patient Details  Name: Guy Espinoza MRN: [id number] Date of Birth: 20-Mar-1927 Referring Provider (PT): Lenor Coffin, MD  CLINIC OPERATION CHANGES: Outpatient [facility] is open at lower capacity following universal masking, social distancing, and patient screening.  The patient's COVID risk of complications score is 4.  Encounter Date: 09/13/2018  PT End of Session - 09/14/18 1344    Visit Number  8    Number of Visits  17    Date for PT Re-Evaluation  10/13/18   wrote 4 week goals, but unable to begin until later in June for appropriate scheduling.  *Recomended patient only come in Fridays, early morning or late afternoon for best social distancing.   Authorization Type  [employer] Medicare    PT Start Time  1600    PT Stop Time  1645    PT Time Calculation (min)  45 min    Activity Tolerance  Patient tolerated treatment well    Behavior During Therapy  WFL for tasks assessed/performed       Past Medical History:  Diagnosis Date  . Asthma   . Hypertension     Past Surgical History:  Procedure Laterality Date  . CATARACT EXTRACTION Left 2007  . double hernia repair  2010  . HERNIA REPAIR  1984  . KNEE SURGERY Left 1992  . ROTATOR CUFF REPAIR Left 2001  . ruptured blood vessel L eye Left 2008    There were no vitals filed for this visit.  Subjective Assessment - 09/14/18 1334    Subjective  Pt states his left knee has tried to give way one time but is not as bad as it was - says it is a little stronger than it was at time of last visit    Pertinent History  asthma and HTN    Patient Stated Goals  Reduce dizziness, improve balance    Pain Onset  More than a month ago                       [facility] Adult PT Treatment/Exercise - 09/14/18 0001      Transfers   Transfers   Sit to Stand    Number of Reps  10 reps    Comments  no UE support       Ambulation/Gait   Ambulation/Gait  Yes    Ambulation/Gait Assistance  5: Supervision    Assistive device  Straight cane    Gait Pattern  Decreased stride length;Decreased dorsiflexion - right;Step-through pattern    Ambulation Surface  Level;Indoor      Exercises   Exercises  Lumbar;Knee/Hip;Ankle      Knee/Hip Exercises: Stretches   Active Hamstring Stretch  Both;1 rep;30 seconds   Runner's stretch     Knee/Hip Exercises: Standing   Heel Raises  Both;1 set;10 reps    Hip Flexion  Stengthening;Both;1 set;10 reps;Knee straight   3# weight used on each leg   Hip Abduction  Stengthening;Both;1 set;10 reps;Knee straight   3# weight used on each leg   Hip Extension  Stengthening;Both;1 set;10 reps;Knee straight   3# weight used on each leg   Forward Step Up  Both;1 set;10 reps;Hand Hold: 1;Step Height: 6"    Other Standing Knee Exercises  toe raises - with bil. UE support at counter 10 reps  Ankle Exercises: Stretches   Gastroc Stretch  1 rep;30 seconds   used 2" block at counter - RLE and LLE 1 rep each         Balance Exercises - 09/14/18 1342      Balance Exercises: Standing   Rockerboard  Anterior/posterior;10 reps;Intermittent UE support   10 reps without UE support with CGA to min asst inside bars   Other Standing Exercises  stepping over and back of black balance beam - inside // bars with minimal UE support 10 reps each leg;  3 balance bubbles used as targets - pt touched each one with min to mod assist for recovery of LOB - touched in various orders, including cossing midline with each leg to improve SLS          PT Short Term Goals - 09/14/18 1352      PT SHORT TERM GOAL #1   Title  The patient will have negative testing for positional vertigo indicating resolution of bilateral BPPV  Target date 06/23/2018    Time  4    Period  Weeks    Status  Achieved    Target Date  06/23/18       PT SHORT TERM GOAL #2   Title  The patient will improve Berg balance score from 35/56 to > or equal to 40/56.    Baseline  37/56.    Time  8    Period  Weeks    Status  Not Met    Target Date  06/23/18      PT SHORT TERM GOAL #3   Title  The patient will return demo HEP mod indep.    Baseline  provided habituation exercises prior to today; added balance program today.    Time  4    Period  Weeks    Status  Partially Met    Target Date  06/23/18        PT Long Term Goals - 09/14/18 1352      PT LONG TERM GOAL #1   Title  The patient will be indep with progression of HEP for post d/c plan [facility]?).  UPDATED TARGET DATE:  09/28/2018    Time  8    Period  Weeks    Status  Revised      PT LONG TERM GOAL #2   Title  The patient will improve Berg score from 37/56 to > or equal to 42/56 to demo improving balance for community mobility.    Time  8    Period  Weeks    Status  Revised      PT LONG TERM GOAL #3   Title  The patient will negotiate short community distances (500 ft) in grass, on pavement, negotiating curbs without loss of balance.    Time  8    Period  Weeks    Status  Revised      PT LONG TERM GOAL #4   Title  The patient will be able to bend to pick up objects from the floor without dizziness or loss of balance.    Time  8    Period  Weeks    Status  Revised            Plan - 09/14/18 1345    Clinical Impression Statement  Pt reports no vertigo at today's visit; pt demonstrates decreased high level balance skills (age-related) but tolerated treatment very well with minimal rest breaks; he was able to perform  rocker board activity inside // bars without UE support with CGA for safety demonstrating good balance with weight shift;  pt is progressing well towards goals.    PT Frequency  2x / week    PT Duration  4 weeks    PT Treatment/Interventions  ADLs/Self Care Home Management;Therapeutic activities;Therapeutic exercise;Balance training;Neuromuscular  re-education;Gait training;Stair training;Functional mobility training;Patient/family education;Canalith Repostioning;Vestibular;Manual techniques    PT Next Visit Plan  Check [place] HEP (did not bring to session on 09-13-18)  balance training, gait training.    PT [place] [phone]    Consulted and Agree with Plan of Care  Patient       Patient will benefit from skilled therapeutic intervention in order to improve the following deficits and impairments:  Abnormal gait, Dizziness, Decreased activity tolerance, Decreased balance, Decreased mobility, Decreased strength, Postural dysfunction, Impaired flexibility, Decreased safety awareness  Visit Diagnosis: 1. Unsteadiness on feet   2. Other abnormalities of gait and mobility        Problem List Patient Active Problem List   Diagnosis Date Noted  . 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

## 2018-09-14 NOTE — Therapy (Signed)
[facility] [address] [place], [place], [address] Phone: [phone]   Fax:  [phone]  Physical Therapy Treatment  Patient Details  Name: Guy Espinoza MRN: [id number] Date of Birth: May 17, 1926 Referring Provider (PT): Lenor Coffin, MD  CLINIC OPERATION CHANGES: Outpatient Neuro Rehab is open at lower capacity following universal masking, social distancing, and patient screening.  The patient's COVID risk of complications score is 4.  Encounter Date: 09/14/2018  PT End of Session - 09/14/18 1640    Visit Number  9    Number of Visits  17    Date for PT Re-Evaluation  10/13/18   wrote 4 week goals, but unable to begin until later in June for appropriate scheduling.  *Recomended patient only come in Fridays, early morning or late afternoon for best social distancing.   Authorization Type  [employer] Medicare    PT Start Time  1205    PT Stop Time  1245    PT Time Calculation (min)  40 min    Activity Tolerance  Patient tolerated treatment well    Behavior During Therapy  WFL for tasks assessed/performed       Past Medical History:  Diagnosis Date  . Asthma   . Hypertension     Past Surgical History:  Procedure Laterality Date  . CATARACT EXTRACTION Left 2007  . double hernia repair  2010  . HERNIA REPAIR  1984  . KNEE SURGERY Left 1992  . ROTATOR CUFF REPAIR Left 2001  . ruptured blood vessel L eye Left 2008    There were no vitals filed for this visit.  Subjective Assessment - 09/14/18 1639    Subjective  The patient reports "Idid a lot of different steps yesterday."  :  The patient reports no further dizziness.  His mobility is improving.  "If I exercise and then go to walk, I don't rely on the cane as much."  He reports lifting toes up is still challenging for him in [place] program.  He came to therapy yesterday evening and hasn't had time to walk.    Pertinent History  asthma and HTN    Patient  Stated Goals  Reduce dizziness, improve balance    Currently in Pain?  No/denies         [facility] PT Assessment - 09/14/18 1642      Ambulation/Gait   Ambulation/Gait  Yes    Ambulation/Gait Assistance  5: Supervision;6: Modified independent (Device/Increase time)      Berg Balance Test   Sit to Stand  Able to stand without using hands and stabilize independently    Standing Unsupported  Able to stand safely 2 minutes    Sitting with Back Unsupported but Feet Supported on Floor or Stool  Able to sit safely and securely 2 minutes    Stand to Sit  Sits safely with minimal use of hands    Transfers  Able to transfer safely, minor use of hands    Standing Unsupported with Eyes Closed  Able to stand 10 seconds safely    Standing Unsupported with Feet Together  Able to place feet together independently and stand 1 minute safely    From Standing, Reach Forward with Outstretched Arm  Can reach forward >12 cm safely (5")    From Standing Position, Pick up Object from Floor  Able to pick up shoe safely and easily    From Standing Position, Turn to Look Behind Over each Shoulder  Turn sideways  only but maintains balance    Turn 360 Degrees  Able to turn 360 degrees safely but slowly    Standing Unsupported, Alternately Place Feet on Step/Stool  Able to complete 4 steps without aid or supervision    Standing Unsupported, One Foot in Front  Able to take small step independently and hold 30 seconds    Standing on One Leg  Tries to lift leg/unable to hold 3 seconds but remains standing independently    Total Score  44    Berg comment:  44/56 improved from 35/56.                   [facility] Adult PT Treatment/Exercise - 09/14/18 1642      Ambulation/Gait   Ambulation/Gait Assistance Details  PT ambulated with patient with [facility] and cues on sequencing with SBA for safety.  Outdoor ambulation with supervision to CGA using SPC with PT providng tactile and demo cues for sequencingof cane.  Ambulated  500 ft on paved surfaces with some incline/decline.  Used adjustable height cane in clinic with cues for sequencing x 300 ft.  Gait without a device x 345 ft with supervision to CGA as he fatigues.  He c/o "the right leg wants to kick up the rug at home."  He has shorter stride and dec'd foot clearance when fatigued.       Therapeutic Activites    Therapeutic Activities  Other Therapeutic Activities    Other Therapeutic Activities  Gait activities working on turns, bending to pick up objects from the floor without loss of balance x 8 reps.             PT Education - 09/14/18 1640    Education Details  discussed continued [facility] home exercise program-  PT to check next week and d/c if going well.    Person(s) Educated  Patient    Methods  Explanation    Comprehension  Verbalized understanding       PT Short Term Goals - 09/14/18 1352      PT SHORT TERM GOAL #1   Title  The patient will have negative testing for positional vertigo indicating resolution of bilateral BPPV  Target date 06/23/2018    Time  4    Period  Weeks    Status  Achieved    Target Date  06/23/18      PT SHORT TERM GOAL #2   Title  The patient will improve Berg balance score from 35/56 to > or equal to 40/56.    Baseline  37/56.    Time  8    Period  Weeks    Status  Not Met    Target Date  06/23/18      PT SHORT TERM GOAL #3   Title  The patient will return demo HEP mod indep.    Baseline  provided habituation exercises prior to today; added balance program today.    Time  4    Period  Weeks    Status  Partially Met    Target Date  06/23/18        PT Long Term Goals - 09/14/18 1641      PT LONG TERM GOAL #1   Title  The patient will be indep with progression of HEP for post d/c plan [facility]?).  UPDATED TARGET DATE:  09/28/2018    Time  8    Period  Weeks    Status  Revised  PT LONG TERM GOAL #2   Title  The patient will improve Berg score from 37/56 to > or equal to 42/56 to demo improving  balance for community mobility.    Baseline  Scored 44/56.    Time  8    Period  Weeks    Status  Achieved      PT LONG TERM GOAL #3   Title  The patient will negotiate short community distances (500 ft) in grass, on pavement, negotiating curbs without loss of balance.    Time  8    Period  Weeks    Status  Revised      PT LONG TERM GOAL #4   Title  The patient will be able to bend to pick up objects from the floor without dizziness or loss of balance.    Baseline  Met on 09/14/2018    Time  8    Period  Weeks    Status  Achieved            Plan - 09/14/18 1647    Clinical Impression Statement  The patient met 2 LTGs.  PT and patient discussed his current status and he notes feeling like he is near his baseline prior to vertigo.  Plan to check [place] HEP next week and d/c if progressing well.    PT Treatment/Interventions  ADLs/Self Care Home Management;Therapeutic activities;Therapeutic exercise;Balance training;Neuromuscular re-education;Gait training;Stair training;Functional mobility training;Patient/family education;Canalith Repostioning;Vestibular;Manual techniques    PT Next Visit Plan  Check [place], plan for d/c.    Consulted and Agree with Plan of Care  Patient       Patient will benefit from skilled therapeutic intervention in order to improve the following deficits and impairments:  Abnormal gait, Dizziness, Decreased activity tolerance, Decreased balance, Decreased mobility, Decreased strength, Postural dysfunction, Impaired flexibility, Decreased safety awareness  Visit Diagnosis: 1. Unsteadiness on feet   2. Other abnormalities of gait and mobility        Problem List Patient Active Problem List   Diagnosis Date Noted  . 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 [place], [place], [address] Phone: [phone]   Fax:   [phone]  Name: Guy Espinoza MRN: [id number] Date of Birth: 1926-06-14

## 2018-09-17 ENCOUNTER — Ambulatory Visit: Payer: Medicare Other | Admitting: Physical Therapy

## 2018-09-21 ENCOUNTER — Other Ambulatory Visit: Payer: Self-pay

## 2018-09-21 ENCOUNTER — Ambulatory Visit: Payer: Medicare Other | Admitting: Rehabilitative and Restorative Service Providers"

## 2018-09-21 ENCOUNTER — Encounter: Payer: Self-pay | Admitting: Rehabilitative and Restorative Service Providers"

## 2018-09-21 DIAGNOSIS — R2689 Other abnormalities of gait and mobility: Secondary | ICD-10-CM | POA: Diagnosis not present

## 2018-09-21 DIAGNOSIS — R2681 Unsteadiness on feet: Secondary | ICD-10-CM

## 2018-09-21 NOTE — Therapy (Signed)
[facility] [address] [place], [place], [address] Phone: [phone]   Fax:  [phone]  Physical Therapy Treatment and Discharge Summary  Patient Details  Name: Guy Espinoza MRN: [id number] Date of Birth: Mar 01, 1927 Referring Provider (PT): Lenor Coffin, MD  CLINIC OPERATION CHANGES: Outpatient Neuro Rehab is open at lower capacity following universal masking, social distancing, and patient screening.  The patient's COVID risk of complications score is 4.   Encounter Date: 09/21/2018  PT End of Session - 09/21/18 1205    Visit Number  10    Number of Visits  17    Date for PT Re-Evaluation  10/13/18   wrote 4 week goals, but unable to begin until later in June for appropriate scheduling.  *Recomended patient only come in Fridays, early morning or late afternoon for best social distancing.   Authorization Type  [employer] Medicare    PT Start Time  1202    PT Stop Time  1230    PT Time Calculation (min)  28 min    Activity Tolerance  Patient tolerated treatment well    Behavior During Therapy  WFL for tasks assessed/performed       Past Medical History:  Diagnosis Date  . Asthma   . Hypertension     Past Surgical History:  Procedure Laterality Date  . CATARACT EXTRACTION Left 2007  . double hernia repair  2010  . HERNIA REPAIR  1984  . KNEE SURGERY Left 1992  . ROTATOR CUFF REPAIR Left 2001  . ruptured blood vessel L eye Left 2008    There were no vitals filed for this visit.  Subjective Assessment - 09/21/18 1204    Subjective  The patient reports he is continuing to do exercises daily.  "Everything is in good shape except my shoulder."  He is walking without the cane.  Re: his shoulder:  If I move it in the wrong direction, I can tell it.    Pertinent History  asthma and HTN    Patient Stated Goals  Reduce dizziness, improve balance    Currently in Pain?  No/denies                        [facility] Adult PT Treatment/Exercise - 09/21/18 1241      Ambulation/Gait   Ambulation/Gait  Yes    Ambulation/Gait Assistance  6: Modified independent (Device/Increase time)    Ambulation/Gait Assistance Details  On outdoor surfaces, ambulated in grass/ on pavement without loss of balance.     Assistive device  None   carried his cane with him   Gait Pattern  Decreased stride length;Decreased dorsiflexion - right;Step-through pattern    Ambulation Surface  Level;Indoor      THERAPEUTIC EXERCISE: Also demonstrated AAROM shoulder exercises using a table for support and pendulum as he notes his shoulder continues to be a limiting factor. Recommended he stay in pain free ROM to perform.     Balance Exercises - 09/21/18 1211      [place] PROGRAM   Head Movements  Standing;5 reps    Trunk Movements  Standing;5 reps   with UE support   Ankle Movements  Sitting    Knee Extensor  10 reps    Knee Flexor  10 reps    Hip ABductor  10 reps    Ankle Plantorflexors  20 reps, support    Knee Bends  10 reps, support  PT Education - 09/21/18 1224    Education Details  HEP updated and reviewed.    Person(s) Educated  Patient    Methods  Demonstration;Explanation;Handout    Comprehension  Verbalized understanding;Returned demonstration       PT Short Term Goals - 09/14/18 1352      PT SHORT TERM GOAL #1   Title  The patient will have negative testing for positional vertigo indicating resolution of bilateral BPPV  Target date 06/23/2018    Time  4    Period  Weeks    Status  Achieved    Target Date  06/23/18      PT SHORT TERM GOAL #2   Title  The patient will improve Berg balance score from 35/56 to > or equal to 40/56.    Baseline  37/56.    Time  8    Period  Weeks    Status  Not Met    Target Date  06/23/18      PT SHORT TERM GOAL #3   Title  The patient will return demo HEP mod indep.    Baseline  provided habituation exercises prior  to today; added balance program today.    Time  4    Period  Weeks    Status  Partially Met    Target Date  06/23/18        PT Long Term Goals - 09/21/18 1210      PT LONG TERM GOAL #1   Title  The patient will be indep with progression of HEP for post d/c plan [facility]?).  UPDATED TARGET DATE:  09/28/2018    Time  8    Period  Weeks    Status  Achieved      PT LONG TERM GOAL #2   Title  The patient will improve Berg score from 37/56 to > or equal to 42/56 to demo improving balance for community mobility.    Baseline  Scored 44/56.    Time  8    Period  Weeks    Status  Achieved      PT LONG TERM GOAL #3   Title  The patient will negotiate short community distances (500 ft) in grass, on pavement, negotiating curbs without loss of balance.    Baseline  Negotiated grass, pavement mod indep outdoors x 550 ft.    Time  8    Period  Weeks    Status  Achieved      PT LONG TERM GOAL #4   Title  The patient will be able to bend to pick up objects from the floor without dizziness or loss of balance.    Baseline  Met on 09/14/2018    Time  8    Period  Weeks    Status  Achieved            Plan - 09/21/18 1234    Clinical Impression Statement  The patient has met all LTGs.  He is doing HEP for LE strength, balance and fall prevention.  His vertigo is cleared.    PT Treatment/Interventions  ADLs/Self Care Home Management;Therapeutic activities;Therapeutic exercise;Balance training;Neuromuscular re-education;Gait training;Stair training;Functional mobility training;Patient/family education;Canalith Repostioning;Vestibular;Manual techniques    PT Next Visit Plan  Discharge today.    Consulted and Agree with Plan of Care  Patient       Patient will benefit from skilled therapeutic intervention in order to improve the following deficits and impairments:  Abnormal gait, Dizziness, Decreased activity tolerance, Decreased  balance, Decreased mobility, Decreased strength, Postural  dysfunction, Impaired flexibility, Decreased safety awareness  Visit Diagnosis: 1. Other abnormalities of gait and mobility   2. Unsteadiness on feet       PHYSICAL THERAPY DISCHARGE SUMMARY  Visits from Start of Care: 10  Current functional level related to goals / functional outcomes: See above   Remaining deficits: Continues with chronic shoulder pain that limits some of his mobility (he was not referred for this) Dec'd high level balance Vertigo cleared   Education / Equipment: HEP, home safety  Plan: Patient agrees to discharge.  Patient goals were met. Patient is being discharged due to meeting the stated rehab goals.  ?????         Thank you for the referral of this patient. Rudell Cobb, MPT    [place] , PT 09/21/2018, 12:43 PM  [facility] [address] [place], [place], [address] Phone: [phone]   Fax:  [phone]  Name: Guy Espinoza MRN: [id number] Date of Birth: [birth year]

## 2018-09-21 NOTE — Patient Instructions (Signed)
Flexion (Passive)    Sitting upright, slide forearm forward along table, bending from the waist until a stretch is felt. Hold _5___ seconds. Repeat _10___ times. Do _1___ sessions per day.  Copyright  VHI. All rights reserved.   Pendulum Circular    Bend forward at the waist, leaning on table for support. Rock body in a circular pattern to move arm clockwise _5___ times then counterclockwise _5___ times. Do _1___ sessions per day.  Copyright  VHI. All rights reserved.

## 2018-09-24 ENCOUNTER — Encounter: Payer: Medicare Other | Admitting: Physical Therapy

## 2018-09-27 ENCOUNTER — Ambulatory Visit: Payer: Medicare Other | Admitting: Physical Therapy

## 2018-10-01 ENCOUNTER — Encounter: Payer: Medicare Other | Admitting: Physical Therapy

## 2018-10-05 ENCOUNTER — Encounter: Payer: Medicare Other | Admitting: Rehabilitative and Restorative Service Providers"

## 2018-11-05 NOTE — Progress Notes (Signed)
PATIENT: Guy Espinoza DOB: 03/07/1927  REASON FOR VISIT: follow up HISTORY FROM: patient  HISTORY OF PRESENT ILLNESS: Today 11/06/18  [name] is a 85 year old male with history of vertigo.  In August 2019 he fell backwards while washing his truck and struck the back of his head.  He has had significant vertigo since that time.  He underwent in CT scan of the brain that was unremarkable but a CT of the cervical spine did show fractures that were possibly subacute or chronic at the T1, T2, and T3 levels, nothing that should alter his ability to ambulate.  He has completed physical therapy and found it to be quite beneficial.  He is no longer having the symptoms of vertigo or dizziness.  He lives with his wife.  He has not driven a car in nearly a year.  He has not had any recent fall, but he has had stumbles.  He does walk with a cane.  Today he reports weight loss, 8 pounds since last visit, chronic left shoulder pain, and left knee pain.  He also has chronic cough.  He presents today for follow-up unaccompanied.  HISTORY 05/08/2018 Dr. Anne HahnWillis: [name] is a 85 year old right-handed white male with a history of vertigo in the past.  The patient fell washing his truck in August 2019, he fell backwards and struck the back of his head.  The patient had onset of significant vertigo since that time, he has also had some gait instability that has been persistent since that time.  He has undergone a CT scan of the brain that was unremarkable, but a CT of the cervical spine did show fractures that were possibly subacute or chronic at the T1, T2, and T3 levels.  The patient does not report a lot of discomfort in the neck or mid back.  He does have some low back pain.  The patient has had ongoing issues with dizziness, he notes dizziness when he sits up out of bed or if he bends over for more than a minute he has a sensation that he is going to fall forward.  The patient has had ongoing problems with  instability with walking, he has stumbled but not falling.  He denies any neck pain or headache.  He has developed some shortness of breath with physical activity that has been new since the fall in August 2019.  He has not discussed this issue with his primary care physician yet.  REVIEW OF SYSTEMS: Out of a complete 14 system review of symptoms, the patient complains only of the following symptoms, and all other reviewed systems are negative.  Vertigo, weight loss, shoulder pain  ALLERGIES: No Known Allergies  HOME MEDICATIONS: Outpatient Medications Prior to Visit  Medication Sig Dispense Refill  . aspirin EC 81 MG tablet Take 81 mg by mouth daily.    . Ferrous Sulfate (IRON) 325 (65 Fe) MG TABS Take 1 tablet by mouth daily.    . fluticasone-salmeterol (ADVAIR HFA) 115-21 MCG/ACT inhaler     . hydrochlorothiazide (HYDRODIURIL) 25 MG tablet Take 25 mg by mouth daily.    Boris Lown. Krill Oil 350 MG CAPS Take by mouth.    . Multiple Vitamins-Minerals (MULTIVITAMIN ADULT) TABS Take 1 tablet by mouth daily.    . Omega-3 Fatty Acids (FISH OIL PO) Take 1,000 mg by mouth daily.     . saw palmetto 160 MG capsule Take 160 mg by mouth daily.    . tamsulosin (FLOMAX) 0.4  MG CAPS capsule Take 0.4 mg by mouth daily after supper.    Marland Kitchen. omeprazole (PRILOSEC) 20 MG capsule Take 1 capsule (20 mg total) by mouth daily. 30 capsule 0   No facility-administered medications prior to visit.     PAST MEDICAL HISTORY: Past Medical History:  Diagnosis Date  . Asthma   . Hypertension     PAST SURGICAL HISTORY: Past Surgical History:  Procedure Laterality Date  . CATARACT EXTRACTION Left 2007  . double hernia repair  2010  . HERNIA REPAIR  1984  . KNEE SURGERY Left 1992  . ROTATOR CUFF REPAIR Left 2001  . ruptured blood vessel L eye Left 2008    FAMILY HISTORY: Family History  Problem Relation Age of Onset  . Bronchitis Mother   . Cancer Mother        ovarian    SOCIAL HISTORY: Social History    Socioeconomic History  . Marital status: Married    Spouse name: Not on file  . Number of children: 1  . Years of education: machine shop classes @ [employer]  . Highest education level: Some college, no degree  Occupational History  . Not on file  Social Needs  . Financial resource strain: Not on file  . Food insecurity    Worry: Not on file    Inability: Not on file  . Transportation needs    Medical: Not on file    Non-medical: Not on file  Tobacco Use  . Smoking status: Never Smoker  . Smokeless tobacco: Never Used  Substance and Sexual Activity  . Alcohol use: Never    Frequency: Never  . Drug use: Never  . Sexual activity: Not on file  Lifestyle  . Physical activity    Days per week: Not on file    Minutes per session: Not on file  . Stress: Not on file  Relationships  . Social Musicianconnections    Talks on phone: Not on file    Gets together: Not on file    Attends religious service: Not on file    Active member of club or organization: Not on file    Attends meetings of clubs or organizations: Not on file    Relationship status: Not on file  . Intimate partner violence    Fear of current or ex partner: Not on file    Emotionally abused: Not on file    Physically abused: Not on file    Forced sexual activity: Not on file  Other Topics Concern  . Not on file  Social History Narrative   Lives at home with his wife   Right handed   Caffeine: very rare      PHYSICAL EXAM  Vitals:   11/06/18 1329  BP: 134/76  Pulse: 64  Temp: 98.6 F (37 C)  Weight: 127 lb (57.6 kg)  Height: 5\' 9"  (1.753 m)   Body mass index is 18.75 kg/m.  Generalized: Well developed, in no acute distress   Neurological examination  Mentation: Alert oriented to time, place, history taking. Follows all commands speech and language fluent Cranial nerve II-XII: Pupils were equal round reactive to light. Extraocular movements were full, visual field were full on confrontational test. Facial  sensation and strength were normal.  Head turning and shoulder shrug were normal and symmetric. Motor: The motor testing reveals 5 over 5 strength of all 4 extremities. Good symmetric motor tone is noted throughout.  Sensory: Sensory testing is intact to soft touch on all  4 extremities. No evidence of extinction is noted.  Coordination: Cerebellar testing reveals good finger-nose-finger and heel-to-shin bilaterally.  Gait and station: Gait is wide-based, mildly unsteady, using a cane.  Tandem gait is unsteady, Romberg negative. Reflexes: Deep tendon reflexes are symmetric and normal bilaterally.   DIAGNOSTIC DATA (LABS, IMAGING, TESTING) - I reviewed patient records, labs, notes, testing and imaging myself where available.  Lab Results  Component Value Date   WBC 5.2 06/16/2017   HGB 13.3 06/16/2017   HCT 39.9 06/16/2017   MCV 92.6 06/16/2017   PLT 171 06/16/2017      Component Value Date/Time   NA 140 06/16/2017 1131   K 3.4 (L) 06/16/2017 1131   CL 102 06/16/2017 1131   CO2 27 06/16/2017 1131   GLUCOSE 106 (H) 06/16/2017 1131   BUN 24 (H) 06/16/2017 1131   CREATININE 0.93 06/16/2017 1131   CALCIUM 8.6 (L) 06/16/2017 1131   PROT 6.8 06/16/2017 1131   ALBUMIN 3.8 06/16/2017 1131   AST 24 06/16/2017 1131   ALT 16 (L) 06/16/2017 1131   ALKPHOS 52 06/16/2017 1131   BILITOT 1.2 06/16/2017 1131   GFRNONAA >60 06/16/2017 1131   GFRAA >60 06/16/2017 1131   No results found for: CHOL, HDL, LDLCALC, LDLDIRECT, TRIG, CHOLHDL No results found for: HGBA1C Lab Results  Component Value Date   [id number] 081 01/02/2018   No results found for: TSH    ASSESSMENT AND PLAN 85 y.o. year old male  has a past medical history of Asthma and Hypertension. here with:  1. Vertigo 2. Gait abnormality  Fortunately, his vertigo has resolved. He successfully completed physical therapy and has found it to be very helpful. I have asked him to discuss his issues of weight loss, left shoulder pain,  and left knee pain with his primary care doctor.  Of particular note, he had a CT scan of his cervical spine 01/09/2018 that showed a nondisplaced fracture of the T1 vertebral body, small compression fracture of T2 and T3 vertebral bodies, with no evidence of compromise to the spinal cord canal.  These findings should not alter the patient's ability to ambulate.  He does not particularly complain of neck or back pain.  He will follow-up on an as-needed basis, of course if any issues arise or new symptoms develop he will follow-up here.   I spent 15 minutes with the patient. 50% of this time was spent discussing plan of care.  Butler Denmark, AGNP-C, DNP 11/06/2018, 1:35 PM [facility] [address] [place], [place] [address] [phone]

## 2018-11-06 ENCOUNTER — Other Ambulatory Visit: Payer: Self-pay

## 2018-11-06 ENCOUNTER — Ambulatory Visit (INDEPENDENT_AMBULATORY_CARE_PROVIDER_SITE_OTHER): Payer: Medicare Other | Admitting: Neurology

## 2018-11-06 ENCOUNTER — Encounter: Payer: Self-pay | Admitting: Neurology

## 2018-11-06 VITALS — BP 134/76 | HR 64 | Temp 98.6°F | Ht 69.0 in | Wt 127.0 lb

## 2018-11-06 DIAGNOSIS — R42 Dizziness and giddiness: Secondary | ICD-10-CM | POA: Diagnosis not present

## 2018-11-06 DIAGNOSIS — F0781 Postconcussional syndrome: Secondary | ICD-10-CM

## 2018-11-06 NOTE — Patient Instructions (Signed)
Please discuss with your primary care doctor weight loss, left shoulder pain, left knee pain. You can follow-up with [place] on an as needed basis. I am glad that physical therapy as been helpful. It it good news that your vertigo has resolved.

## 2018-11-06 NOTE — Progress Notes (Signed)
I have read the note, and I agree with the clinical assessment and plan.  Aldona Bryner K Nathanuel Cabreja   

## 2018-11-07 IMAGING — CT CT ABD-PELV W/ CM
2 of 5 series · 15 of 46 positions shown, 17 images · IV contrast (ISOVUE)
Comparison: None

CLINICAL DATA: Acute generalized abdominal pain, 2 episodes of
vomiting, multiple episodes of incontinence with watery black stool,
no fever, history asthma, hypertension

EXAM:
CT ABDOMEN AND PELVIS WITH CONTRAST
TECHNIQUE: Multidetector CT imaging of the abdomen and pelvis was performed
using the standard protocol following bolus administration of
intravenous contrast.
CONTRAST:  100mL [id number] IOPAMIDOL ([id number]-QTT) INJECTION 61% IV.
Dilute oral contrast.

[Series 2: axial st · axial · 0.71mm/px · z∈[-482,-107]mm · 12 of 87 slices shown, 14 images]
[im 6/87  soft-tissue]
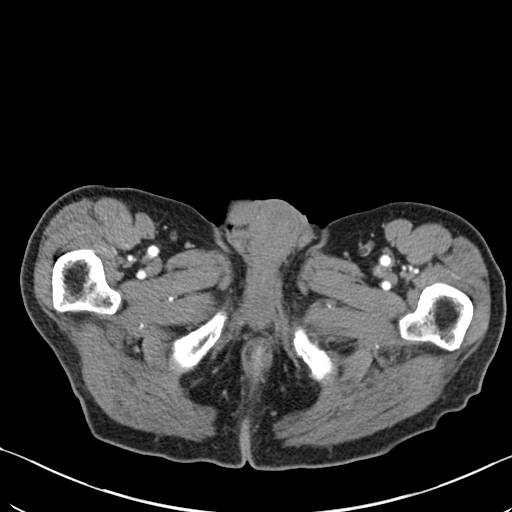
[im 6/87  bone]
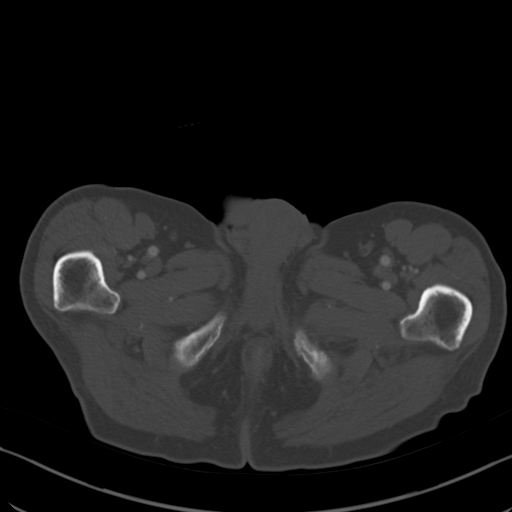
[im 11/87  soft-tissue]
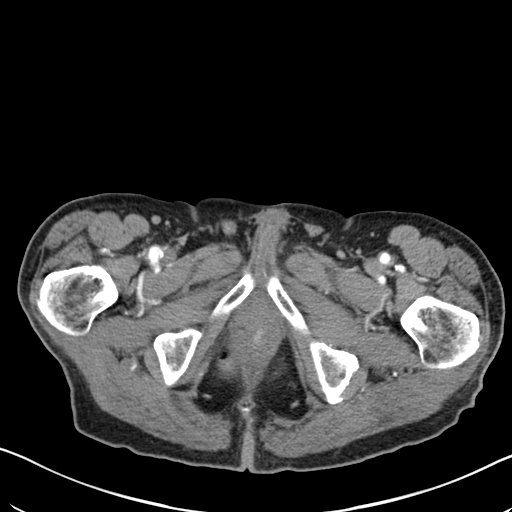
[im 22/87  soft-tissue]
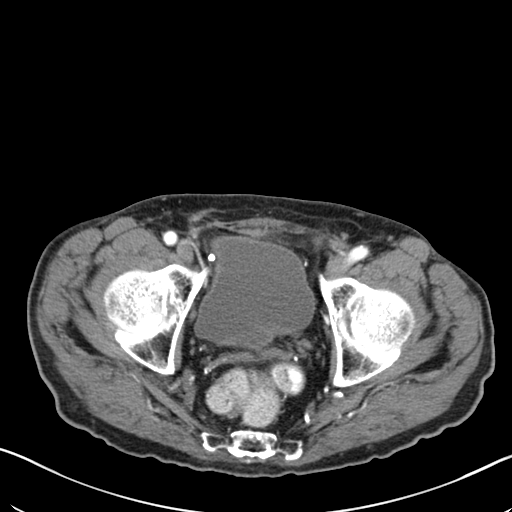
[im 27/87  soft-tissue]
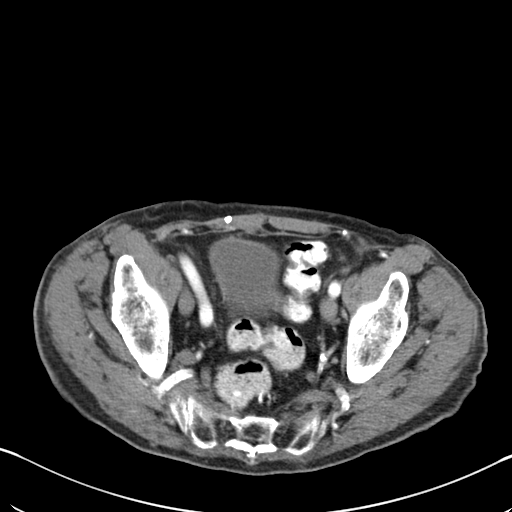
[im 33/87  soft-tissue]
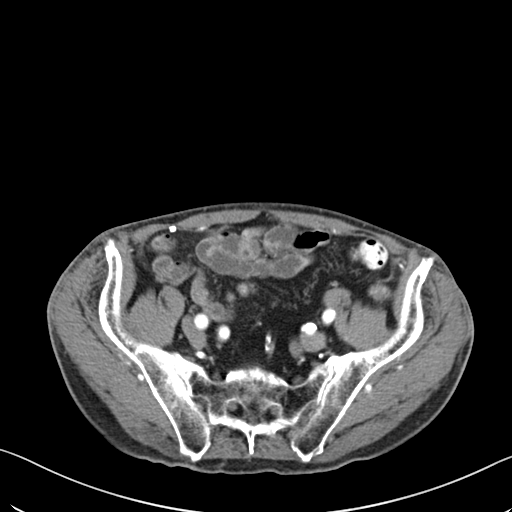
[im 38/87  soft-tissue]
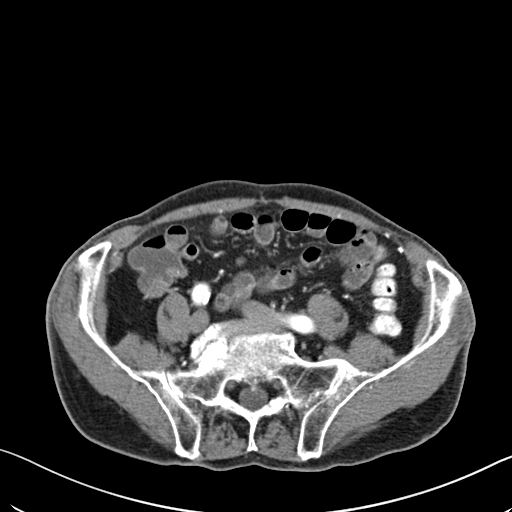
[im 49/87  soft-tissue]
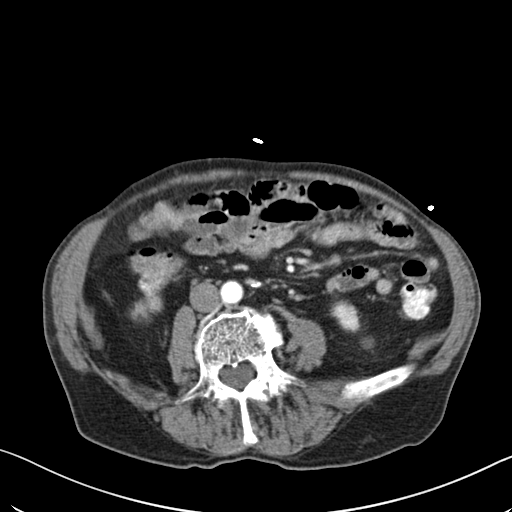
[im 54/87  soft-tissue]
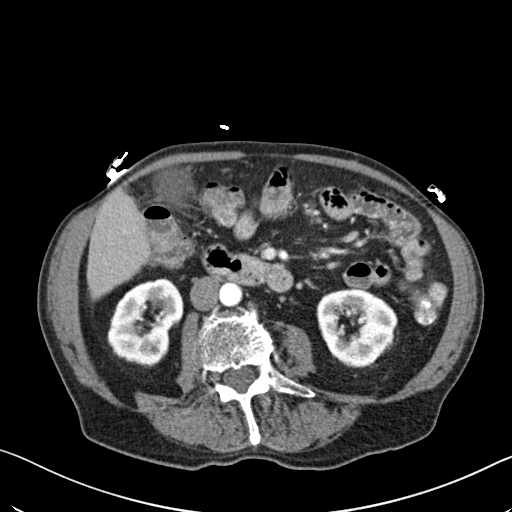
[im 60/87  soft-tissue]
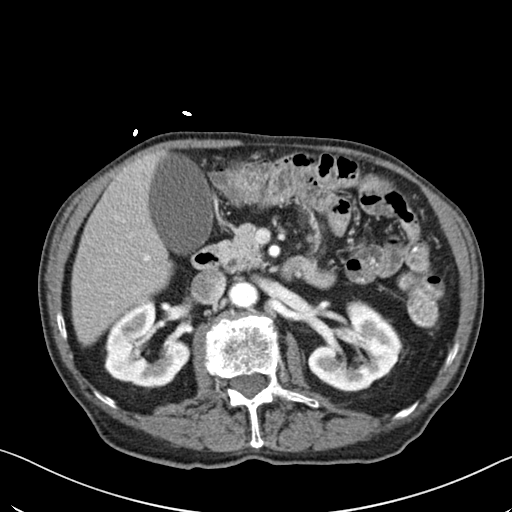
[im 60/87  bone]
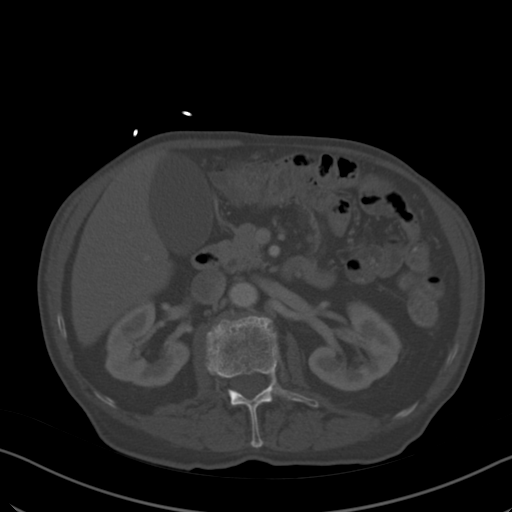
[im 65/87  soft-tissue]
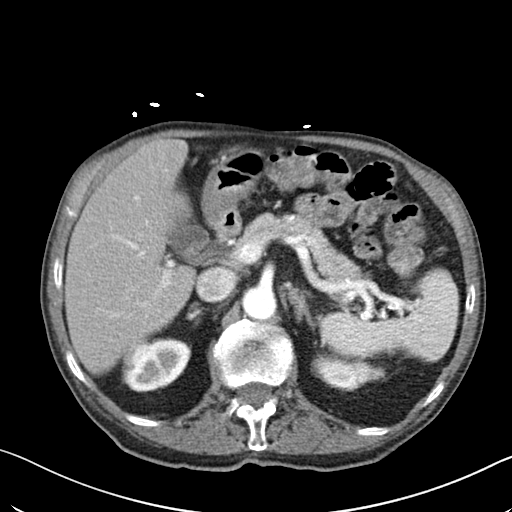
[im 76/87  soft-tissue]
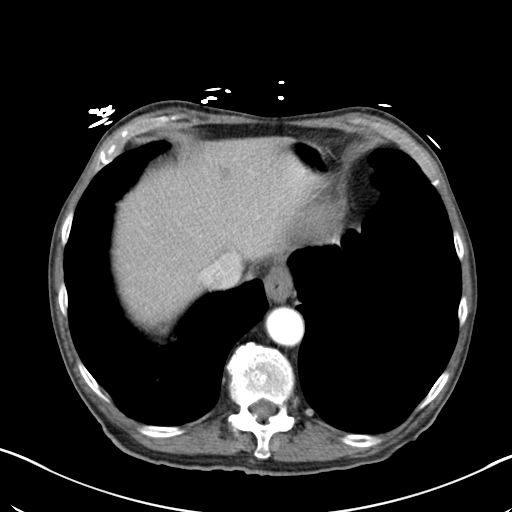
[im 81/87  soft-tissue]
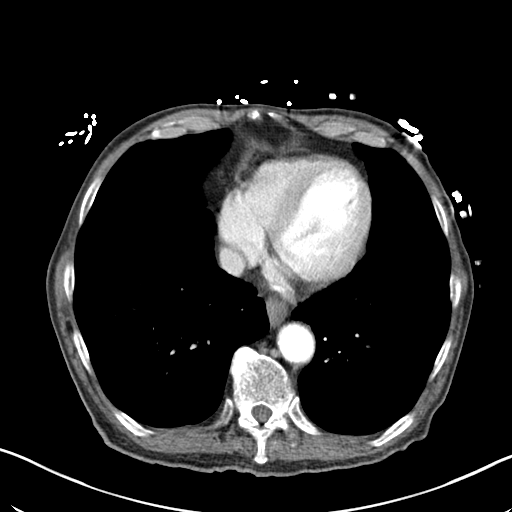

[Series 5: coronal st · coronal · 0.65mm/px · 3 of 84 slices shown]
[im 28/84  soft-tissue]
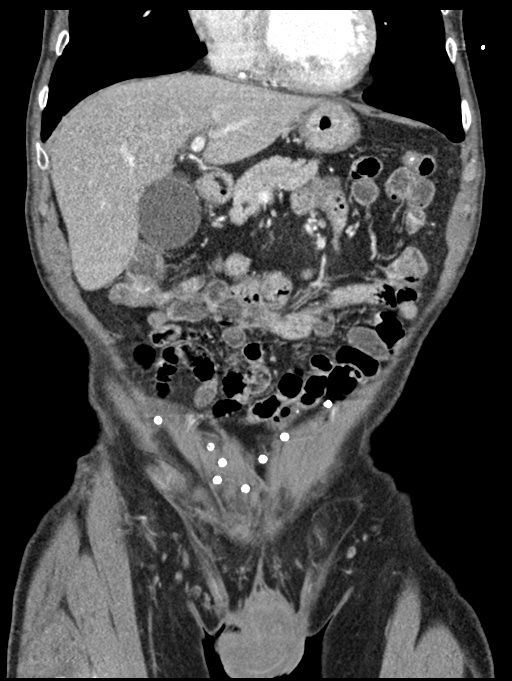
[im 37/84  soft-tissue]
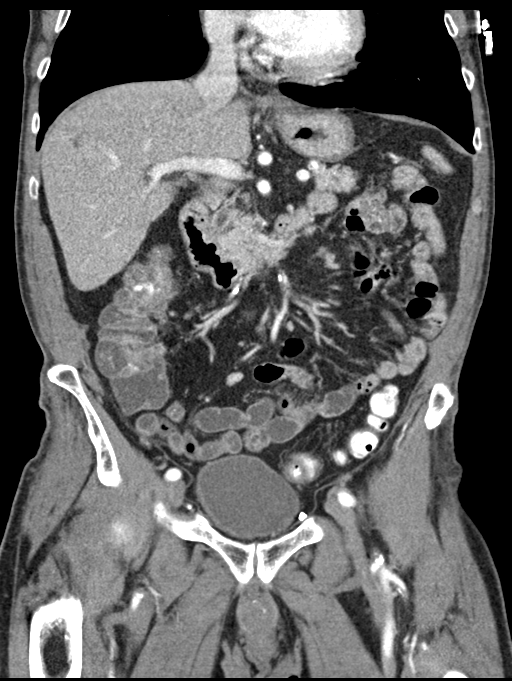
[im 47/84  soft-tissue]
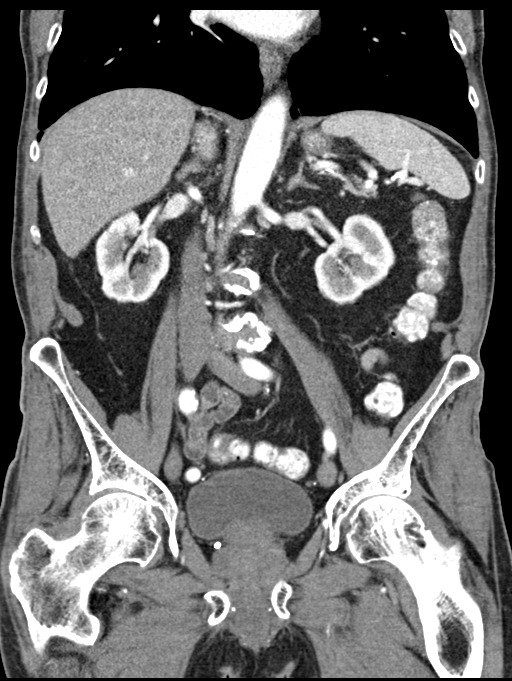

[15 of 46 positions shown; findings below may reference images not displayed]

FINDINGS: Lower chest: Minimal atelectasis at base of lingula and dependently
in RIGHT lower lobe. Lungs appear hyperinflated but clear.

Hepatobiliary: Tiny hepatic cysts. Distended gallbladder without
definite wall thickening or calcification. No biliary dilatation or
additional hepatic abnormalities.

Pancreas: Normal appearance

Spleen: Normal appearance

Adrenals/Urinary Tract: Adrenal glands, kidneys, ureters, and
bladder normal appearance

Stomach/Bowel: Normal appendix. Large and small bowel loops
unremarkable. Stomach incompletely distended. Small hiatal hernia
with question wall thickening of distal esophagus and
gastroesophageal junction, could be inflammatory or neoplastic in
origin. Distal gastric antrum also shows questionable wall
thickening versus artifact from underdistention.

Vascular/Lymphatic: Scattered pelvic phleboliths. Atherosclerotic
calcifications aorta, iliac arteries and coronary arteries. Aorta
normal caliber. No adenopathy.

Reproductive: Prostatic enlargement gland measuring 5.2 x 3.7 x
cm.

Other: No free air or free fluid. Question prior ventral hernia
repair in pelvis. No acute inflammatory process.

Musculoskeletal: Bones demineralized with multilevel degenerative
disc disease changes scoliosis of the thoracolumbar spine..
IMPRESSION: Small hiatal hernia with questionable gastric wall thickening at the
cardia extending into distal esophagus, less at distal gastric
antrum, could be related to gastritis/esophagitis though neoplasm
not excluded; follow-up EGD recommended.

Prostatic enlargement.

## 2019-06-02 IMAGING — CT CT CERVICAL SPINE W/O CM
4 of 5 series · 14 of 33 positions shown, 17 images · non-contrast
Comparison: None.

CLINICAL DATA: Unsteady gait for couple of weeks. Fall with head
trauma 6 weeks ago. Vertigo. Ataxia.

EXAM:
CT CERVICAL SPINE WITHOUT CONTRAST
TECHNIQUE: Multidetector CT imaging of the cervical spine was performed without
intravenous contrast. Multiplanar CT image reconstructions were also
generated.

[Series 3: c-spine 2.00 br60 s3 axial bone · axial · 0.37mm/px · z∈[-621,-499]mm · 5 of 93 slices shown, 7 images]
[im 16/93  soft-tissue]
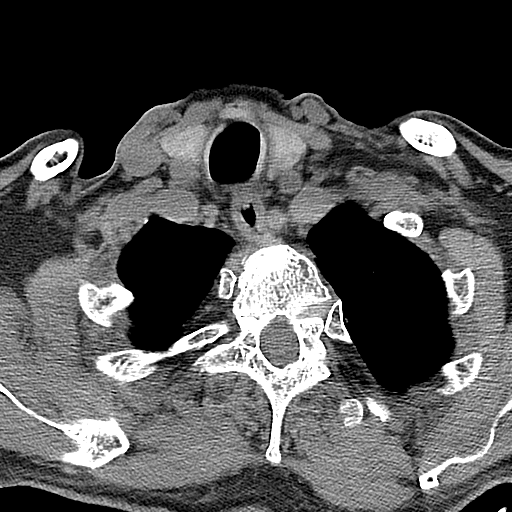
[im 16/93  bone]
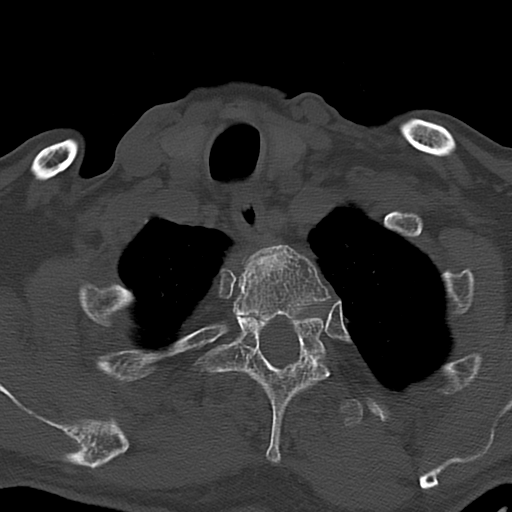
[im 31/93  bone]
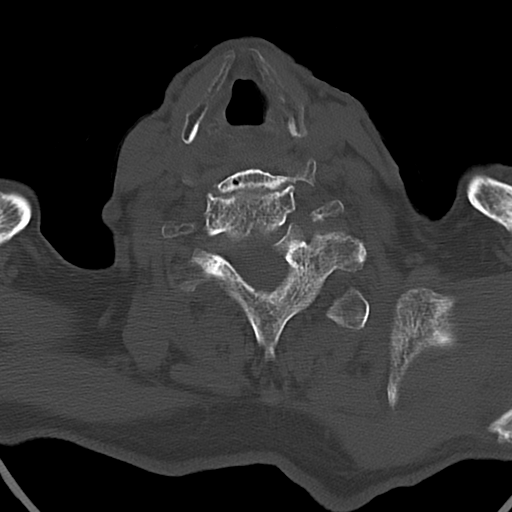
[im 47/93  bone]
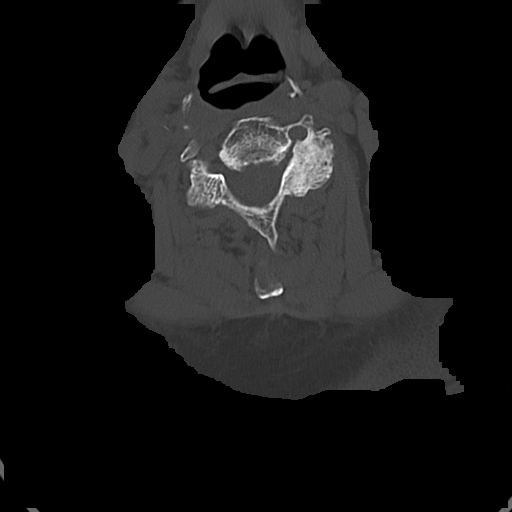
[im 62/93  bone]
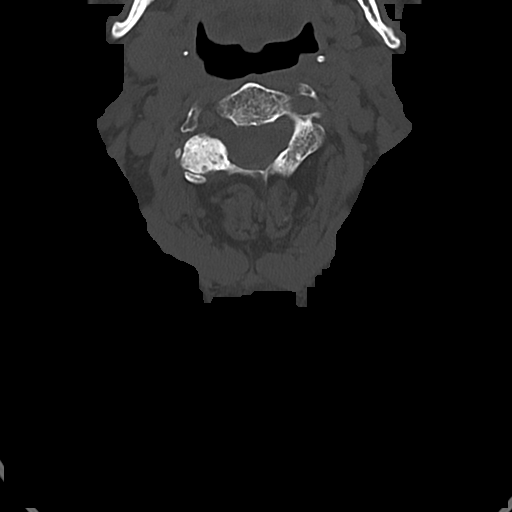
[im 77/93  soft-tissue]
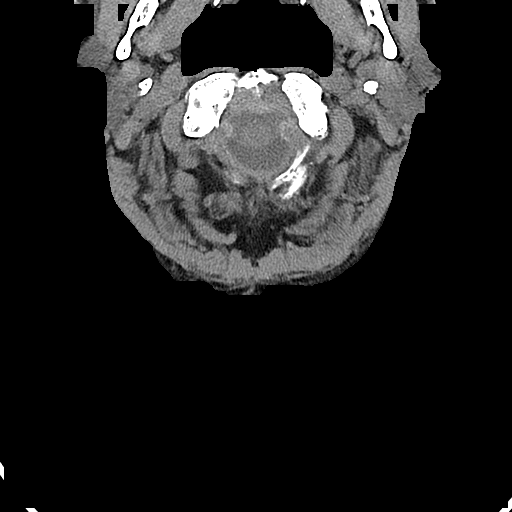
[im 77/93  bone]
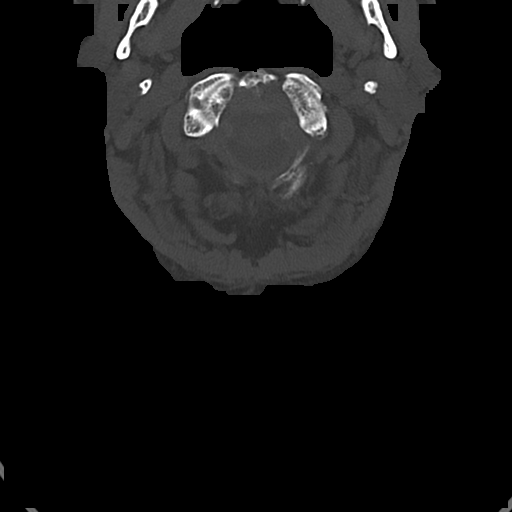

[Series 4: c-spine 2.00 br40 s3 axial (person_name) · axial · 0.37mm/px · z∈[-625,-593]mm · 2 of 95 slices shown]
[im 16/95  bone]
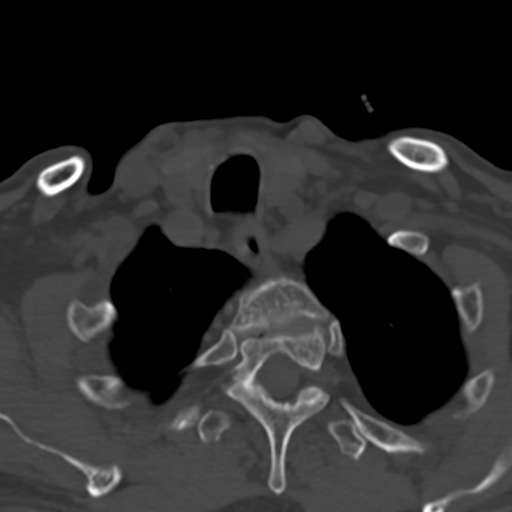
[im 32/95  bone]
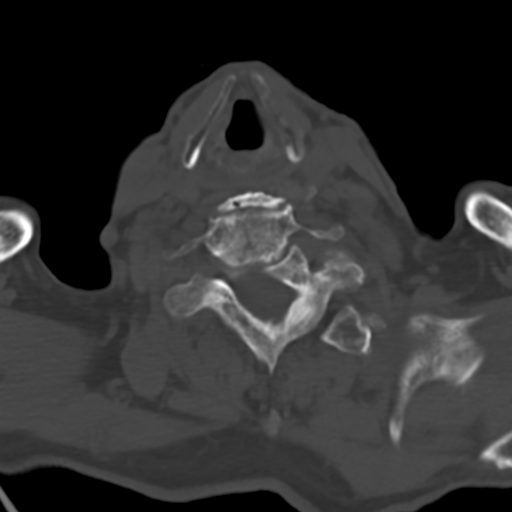

[Series 5: c-spine 2.00 br60 s3 sag sag bone · sagittal · 0.35mm/px · 5 of 79 slices shown, 6 images]
[im 27/79  bone]
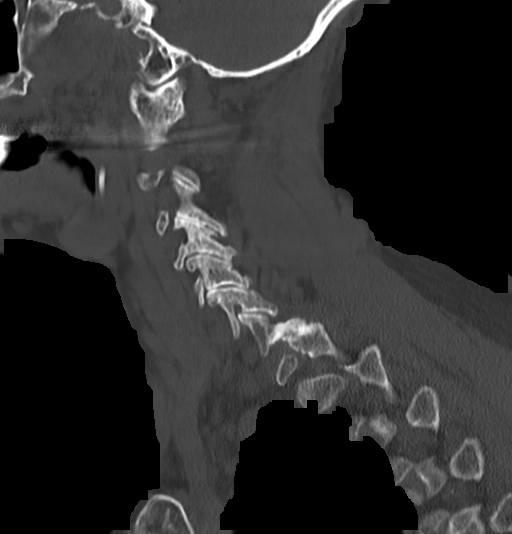
[im 33/79  bone]
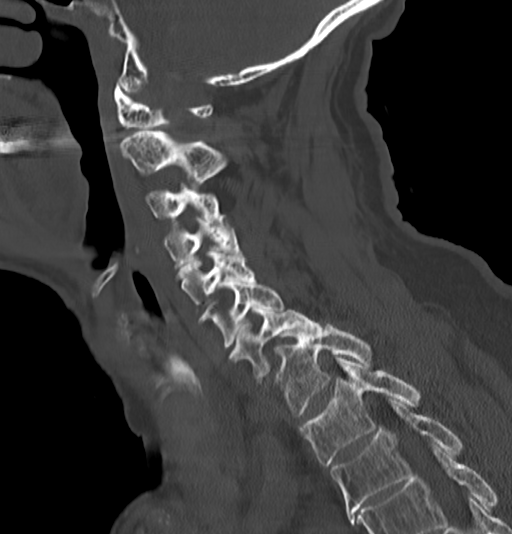
[im 40/79  soft-tissue]
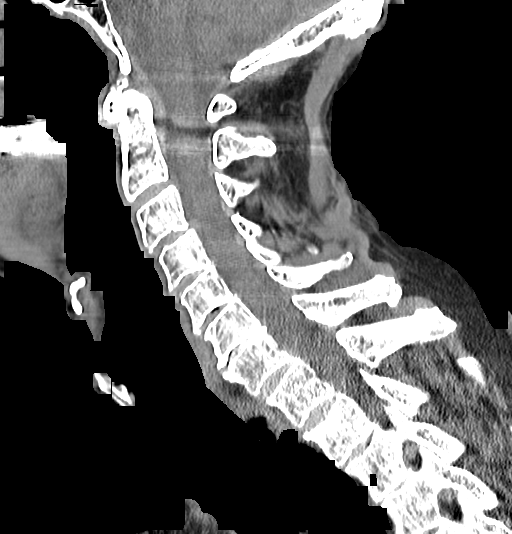
[im 40/79  bone]
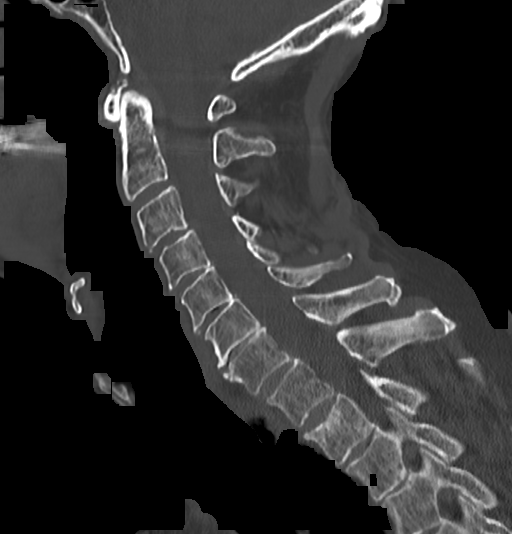
[im 46/79  bone]
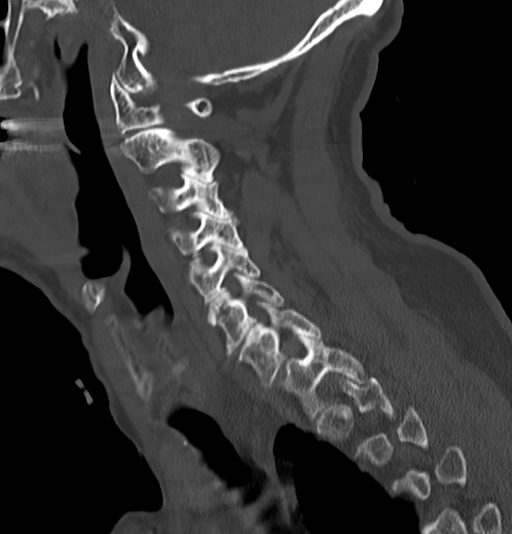
[im 53/79  bone]
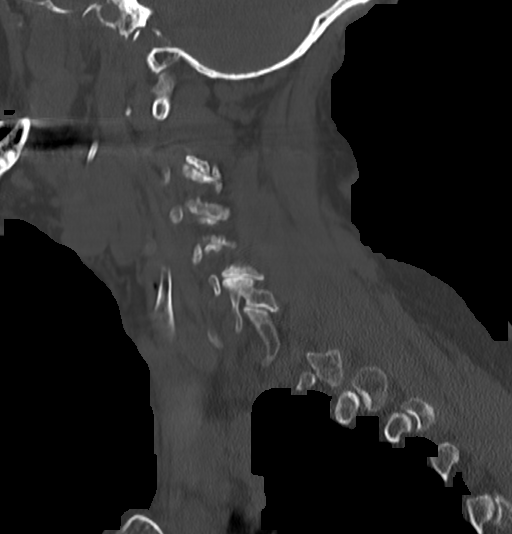

[Series 7: c-spine 2.00 hr60 s3 cor cor bone · coronal · 0.31mm/px · 2 of 82 slices shown]
[im 28/82  bone]
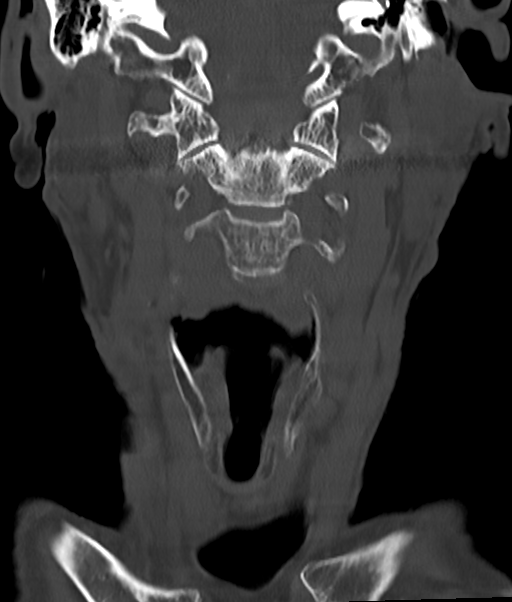
[im 55/82  bone]
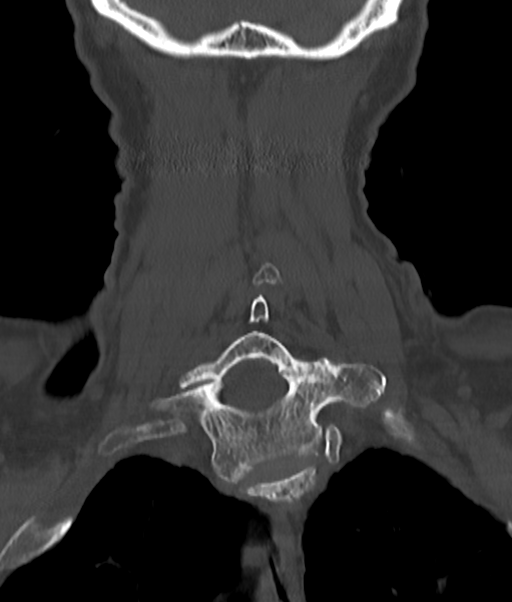

[14 of 33 positions shown; findings below may reference images not displayed]

FINDINGS: Alignment: Trace anterolisthesis of C3 on C4 and C7 on T1.

Skull base and vertebrae: There is curvilinear lucency in the T1
vertebral body with suggestion of focal superior and inferior
endplate cortical disruption suspicious for an oblique nondisplaced
fracture. There is only minimal anterior vertebral body height loss
at T1. A T2 compression fracture is present with sclerosis along the
superior endplate and 25% height loss without retropulsion. There is
slight central depression of the T3 superior greater than inferior
endplates with 20% height loss centrally and without a distinct
fracture line visible, possibly chronic. No cervical spine fracture
is identified. No suspicious osseous lesion is seen.

Soft tissues and spinal canal: No prevertebral fluid or swelling. No
visible canal hematoma.

Disc levels:

C2-3: Moderate right and mild left facet arthrosis without stenosis.

C3-4: Uncovertebral spurring and moderate to severe facet arthrosis
result in at most mild right greater than left neural foraminal
stenosis without spinal stenosis.

C4-5: Uncovertebral spurring and moderate right and severe left
facet arthrosis result in mild right and moderate left neural
foraminal stenosis without spinal stenosis.

C5-6: Uncovertebral spurring and mild right and moderate left facet
arthrosis result in mild left neural foraminal stenosis without
spinal stenosis.

C6-7: Uncovertebral spurring and mild facet arthrosis result in mild
bilateral neural foraminal stenosis without spinal stenosis.

C7-T1: Anterolisthesis, asymmetric left uncovertebral spurring, and
severe left facet arthrosis result in severe left neural foraminal
stenosis without spinal stenosis.

Upper chest: Clear lung apices.

Other: None.
IMPRESSION: 1. Possible nondisplaced T1 vertebral body fracture.
2. T2 compression fracture with 25% height loss and sclerosis along
the superior endplate, possibly subacute or chronic.
3. Mild central T3 compression deformity of indeterminate age though
potentially chronic.
4. Diffuse cervical disc and facet degeneration resulting in neural
foraminal stenosis as above, moderate on the left at C4-5 and severe
on the left at C7-T1.

These results will be called to the ordering clinician or
representative by the Radiologist Assistant, and communication
documented in the PACS or zVision Dashboard.

## 2020-03-02 ENCOUNTER — Ambulatory Visit: Payer: Medicare Other | Attending: Internal Medicine

## 2020-03-02 ENCOUNTER — Other Ambulatory Visit (HOSPITAL_BASED_OUTPATIENT_CLINIC_OR_DEPARTMENT_OTHER): Payer: Self-pay | Admitting: Internal Medicine

## 2020-03-02 DIAGNOSIS — Z23 Encounter for immunization: Secondary | ICD-10-CM

## 2020-03-02 NOTE — Progress Notes (Signed)
   Covid-19 Vaccination Clinic  Name:  MAXEY RANSOM    MRN: [id number] DOB: [birth year]  03/02/2020  Mr. Kille was observed post Covid-19 immunization for 15 minutes without incident. He was provided with Vaccine Information Sheet and instruction to access the V-Safe system.   Mr. Badley was instructed to call 911 with any severe reactions post vaccine: Marland Kitchen Difficulty breathing  . Swelling of face and throat  . A fast heartbeat  . A bad rash all over body  . Dizziness and weakness   Immunizations Administered    Name Date Dose VIS Date Route   Pfizer COVID-19 Vaccine 03/02/2020 10:40 AM 0.3 mL 01/15/2020 Intramuscular   Manufacturer: [employer], [employer]   Lot: [id number]   NDC: [address]-9

## 2020-03-09 MED FILL — PFIZER-BIONTECH COVID-19 VA: 30 | 1 days supply | Qty: 0 | Fill #0

## 2023-07-18 ENCOUNTER — Ambulatory Visit: Attending: Internal Medicine | Admitting: Internal Medicine

## 2023-07-18 ENCOUNTER — Encounter: Payer: Self-pay | Admitting: Internal Medicine

## 2023-07-18 VITALS — BP 136/65 | HR 48 | Ht 69.0 in | Wt 117.0 lb

## 2023-07-18 DIAGNOSIS — R0609 Other forms of dyspnea: Secondary | ICD-10-CM

## 2023-07-18 DIAGNOSIS — I10 Essential (primary) hypertension: Secondary | ICD-10-CM

## 2023-07-18 DIAGNOSIS — I2510 Atherosclerotic heart disease of native coronary artery without angina pectoris: Secondary | ICD-10-CM

## 2023-07-18 DIAGNOSIS — I700 Atherosclerosis of aorta: Secondary | ICD-10-CM | POA: Diagnosis not present

## 2023-07-18 DIAGNOSIS — Z7189 Other specified counseling: Secondary | ICD-10-CM

## 2023-07-18 DIAGNOSIS — R42 Dizziness and giddiness: Secondary | ICD-10-CM

## 2023-07-18 LAB — BASIC METABOLIC PANEL WITH GFR
BUN/Creatinine Ratio: 21 (ref 10–24)
BUN: 21 mg/dL (ref 10–36)
CO2: 24 mmol/L (ref 20–29)
Calcium: 9.8 mg/dL (ref 8.6–10.2)
Chloride: 102 mmol/L (ref 96–106)
Creatinine, Ser: 1 mg/dL (ref 0.76–1.27)
Glucose: 106 mg/dL — ABNORMAL HIGH (ref 70–99)
Potassium: 4.6 mmol/L (ref 3.5–5.2)
Sodium: 141 mmol/L (ref 134–144)
eGFR: 69 mL/min/{1.73_m2} (ref >59)

## 2023-07-18 NOTE — Patient Instructions (Addendum)
 Medication Instructions:  No changes *If you need a refill on your cardiac medications before your next appointment, please call your pharmacy*  Lab Work: Today: BMET (To check kidney function and electrolytes)  If you have labs (blood work) drawn today and your tests are completely normal, you will receive your results only by: MyChart Message (if you have MyChart) OR A paper copy in the mail If you have any lab test that is abnormal or we need to change your treatment, we will call you to review the results.  Testing/Procedures:  Your physician has requested that you have an echocardiogram, next available appointment. Echocardiography is a painless test that uses sound waves to create images of your heart. It provides your doctor with information about the size and shape of your heart and how well your heart's chambers and valves are working. This procedure takes approximately one hour. There are no restrictions for this procedure. Please do NOT wear cologne, perfume, aftershave, or lotions (deodorant is allowed). Please arrive 15 minutes prior to your appointment time.     Your cardiac CT will be scheduled at the below location:   [facility] [address] [place], [place] [address] [phone]  If scheduled at [facility], please arrive at the [facility] and Children's Entrance (Entrance C2) of [facility] 30 minutes prior to test start time. You can use the FREE valet parking offered at entrance C (encouraged to control the heart rate for the test)  Proceed to the [facility] Radiology Department (first floor) to check-in and test prep.   All radiology patients and guests should use entrance C2 at [facility], accessed from [facility], even though the hospital's physical address listed is [address].     Please follow these instructions carefully (unless otherwise directed):  An IV will be required for this test  and Nitroglycerin will be given.  Hold all erectile dysfunction medications at least 3 days (72 hrs) prior to test. (Ie viagra, cialis, sildenafil, tadalafil, etc)   On the Night Before the Test: Be sure to Drink plenty of water. Do not consume any caffeinated/decaffeinated beverages or chocolate 12 hours prior to your test. Do not take any antihistamines 12 hours prior to your test.   On the Day of the Test: Drink plenty of water until 1 hour prior to the test. Do not eat any food 1 hour prior to test. You may take your regular medications prior to the test.  Take metoprolol (Lopressor) two hours prior to test. If you take Furosemide/Hydrochlorothiazide/Spironolactone/Chlorthalidone, please HOLD on the morning of the test. Patients who wear a continuous glucose monitor MUST remove the device prior to scanning.       After the Test: Drink plenty of water. After receiving IV contrast, you may experience a mild flushed feeling. This is normal. On occasion, you may experience a mild rash up to 24 hours after the test. This is not dangerous. If this occurs, you can take Benadryl 25 mg, Zyrtec, Claritin, or Allegra and increase your fluid intake. (Patients taking Tikosyn should avoid Benadryl, and may take Zyrtec, Claritin, or Allegra) If you experience trouble breathing, this can be serious. If it is severe call 911 IMMEDIATELY. If it is mild, please call our office.  We will call to schedule your test 2-4 weeks out understanding that some insurance companies will need an authorization prior to the service being performed.   For more information and frequently asked  questions, please visit our website : [email or web address]  For non-scheduling related questions, please contact the cardiac imaging nurse navigator should you have any questions/concerns: Cardiac Imaging Nurse Navigators Direct Office Dial: [phone]   For scheduling needs, including cancellations and  rescheduling, please call [place], [phone].   Follow-Up: At [facility], you and your health needs are our priority.  As part of our continuing mission to provide you with exceptional heart care, our providers are all part of one team.  This team includes your primary Cardiologist (physician) and Advanced Practice Providers or APPs (Physician Assistants and Nurse Practitioners) who all work together to provide you with the care you need, when you need it.  Your next appointment:    08/28/2023 (Monday) at 2:20 pm (at new building at [address], [place], [place])  Provider:   Sharren Decree, PA-C   We recommend signing up for the patient portal called "MyChart".  Sign up information is provided on this After Visit Summary.  MyChart is used to connect with patients for Virtual Visits (Telemedicine).  Patients are able to view lab/test results, encounter notes, upcoming appointments, etc.  Non-urgent messages can be sent to your provider as well.   To learn more about what you can do with MyChart, go to [email or web address].   Other Instructions Please call us  or send a MyChart message with any Cardiology related questions/concerns.  [phone].  Thank you!         Valet parking services will be available as well.

## 2023-07-18 NOTE — Progress Notes (Signed)
 Cardiology Office Note:  .   Date:  07/18/2023  ID:  Guy Espinoza, DOB Sep 28, 1926, MRN [id number] PCP: Wyn Heater, MD  [facility] Providers Cardiologist:  None    History of Present Illness: .   Guy Espinoza is a 89 y.o. male.  Discussed the use of AI scribe software for clinical note transcription with the patient, who gave verbal consent to proceed.  History of Present Illness The patient, a 89 year old with a history of high blood pressure, coronary artery disease, aortic atherosclerosis, and high cholesterol, presents with exertional dyspnea and balance issues. The patient reports difficulty maintaining balance, particularly on uneven ground, and has experienced several falls in the past five years, one of which resulted in a concussion. The patient also experiences shortness of breath with exertion, such as when bending over to pick up sticks or walking around the block, which takes about 20 minutes with at least one rest break. The patient reports that the shortness of breath has been a problem for several years and has been gradually worsening. The patient does experiencing chest pain at times when he is very SOB.    ROS: negative except per HPI above.  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) and likely blocked PACs Left axis deviation Non-specific intra-ventricular conduction block Minimal voltage criteria for LVH, may be normal variant ( Cornell product ) When compared with ECG of 16-Jun-2017 13:35, PREVIOUS ECG IS PRESENT Confirmed by Grady Lawman ([id number]) on 07/18/2023 10:13:47 AM    Results LABS LDL: 98 mg/dL Triglycerides: 865 mg/dL HDL: 57 mg/dL  RADIOLOGY Chest CT: Minimal coronary  plaque and calcium (2005)  DIAGNOSTIC Cardiac catheterization: Nonobstructive blockages (2005) EKG: No atrial fibrillation (07/18/2023) Risk Assessment/Calculations:          Physical Exam:   VS:  BP 136/65   Pulse (!) 48   Ht 5\' 9"  (1.753 m)   Wt 117 lb (53.1 kg)   SpO2 97%   BMI 17.28 kg/m    Wt Readings from Last 3 Encounters:  07/18/23 117 lb (53.1 kg)  11/06/18 127 lb (57.6 kg)  05/08/18 135 lb (61.2 kg)     Physical Exam GENERAL: Alert, cooperative, well developed, no acute distress. HEENT: Normocephalic, normal oropharynx, moist mucous membranes. CHEST: Clear to auscultation bilaterally, no wheezes, rhonchi, or crackles. CARDIOVASCULAR: Normal heart rate and rhythm, S1 and S2 normal without murmurs. ABDOMEN: Soft, non-tender, non-distended, without organomegaly, normal bowel sounds. EXTREMITIES: No cyanosis or edema. NEUROLOGICAL: Cranial nerves grossly intact, moves all extremities without gross motor or sensory deficit.   ASSESSMENT AND PLAN: .    Assessment and Plan Assessment & Plan Coronary artery disease No major coronary artery blockages or stent placement in 2005. Previous catheterization showed nonobstructive blockages. Pt and wife eager to determine cause of symptoms, uncertain etiology at this time, but at least intermediate risk for obstructive CAD. Discussed noninvasive strategy for risk stratification. CCTA may give us  most definitive assessment of CAD.  - Order echocardiogram to assess heart function and valves. - Order CT scan of the heart to evaluate for blockages.  Shortness of breath on exertion Progressively worsening, associated with chest pain. Differential includes coronary artery disease, heart muscle issues,  or electrical system problems. Advised not to increase exercise until further evaluation, as patient was asking for exercise recommendations. May continue to walk.  - Order echocardiogram to assess heart function and valves. - Order CT  scan of the heart to evaluate for blockages.  Aortic atherosclerosis Presence of plaque and calcium noted.  Hypertension Blood pressure well-managed with current medication regimen, hydrochlorothiazide 25 mg daily, losartan 25 mg daily.  Dizziness and balance issues Present for several years, initially following a concussion. Occur on uneven ground, less likely cardiac though pt does demonstrate Mobitz I second degree AVB. Previous vestibular therapy provided some improvement.  Goals of Care Wishes to undergo testing to determine cause of symptoms but uncertain about invasive procedures. Agreed to discuss each phase of testing and procedures.      Grady Lawman, MD, [facility]

## 2023-07-28 ENCOUNTER — Telehealth (HOSPITAL_COMMUNITY): Payer: Self-pay | Admitting: *Deleted

## 2023-07-28 NOTE — Telephone Encounter (Signed)
 Attempted to call patient regarding upcoming cardiac CT appointment. Left message on voicemail with name and callback number Johney Frame RN Navigator Cardiac Imaging [facility] [phone] Office

## 2023-07-31 ENCOUNTER — Ambulatory Visit (HOSPITAL_COMMUNITY)
Admission: RE | Admit: 2023-07-31 | Discharge: 2023-07-31 | Disposition: A | Discharge status: Home / Self Care | Source: Ambulatory Visit | Attending: Internal Medicine | Admitting: Internal Medicine

## 2023-07-31 DIAGNOSIS — R0609 Other forms of dyspnea: Secondary | ICD-10-CM | POA: Diagnosis present

## 2023-07-31 DIAGNOSIS — I2510 Atherosclerotic heart disease of native coronary artery without angina pectoris: Secondary | ICD-10-CM | POA: Diagnosis present

## 2023-07-31 MED ORDER — NITROGLYCERIN 0.4 MG SL SUBL
SUBLINGUAL_TABLET | SUBLINGUAL | Status: AC
  Filled 2023-07-31: qty 2

## 2023-07-31 MED ORDER — METOPROLOL TARTRATE 5 MG/5ML IV SOLN: 10.0000 mg | INTRAVENOUS | Status: DC | PRN

## 2023-07-31 MED ORDER — NITROGLYCERIN 0.4 MG SL SUBL
0.8000 mg | SUBLINGUAL_TABLET | Freq: Once | SUBLINGUAL | Status: AC
  Administered 2023-07-31: 0.8 mg via SUBLINGUAL

## 2023-07-31 MED ORDER — IOHEXOL 350 MG/ML SOLN
100.0000 mL | Freq: Once | INTRAVENOUS | Status: AC | PRN
  Administered 2023-07-31: 100 mL via INTRAVENOUS

## 2023-07-31 MED ORDER — DILTIAZEM HCL 25 MG/5ML IV SOLN: 10.0000 mg | INTRAVENOUS | Status: DC | PRN

## 2023-08-01 ENCOUNTER — Ambulatory Visit (HOSPITAL_COMMUNITY): Attending: Internal Medicine

## 2023-08-01 DIAGNOSIS — R0609 Other forms of dyspnea: Secondary | ICD-10-CM | POA: Insufficient documentation

## 2023-08-01 DIAGNOSIS — I2510 Atherosclerotic heart disease of native coronary artery without angina pectoris: Secondary | ICD-10-CM | POA: Diagnosis present

## 2023-08-01 LAB — ECHOCARDIOGRAM COMPLETE
AR max vel: 1.52 cm2
AV Area VTI: 1.43 cm2
AV Area mean vel: 1.51 cm2
AV Mean grad: 4 mmHg
AV Peak grad: 7.3 mmHg
Ao pk vel: 1.35 m/s
Calc EF: 46.6 %
Single Plane A2C EF: 47.2 %
Single Plane A4C EF: 42.4 %

## 2023-08-08 ENCOUNTER — Ambulatory Visit: Attending: Internal Medicine | Admitting: Internal Medicine

## 2023-08-08 ENCOUNTER — Other Ambulatory Visit: Payer: Self-pay | Admitting: *Deleted

## 2023-08-08 ENCOUNTER — Ambulatory Visit

## 2023-08-08 ENCOUNTER — Encounter: Payer: Self-pay | Admitting: Internal Medicine

## 2023-08-08 VITALS — BP 150/64 | HR 48 | Ht 69.0 in | Wt 119.2 lb

## 2023-08-08 DIAGNOSIS — I10 Essential (primary) hypertension: Secondary | ICD-10-CM | POA: Diagnosis not present

## 2023-08-08 DIAGNOSIS — I493 Ventricular premature depolarization: Secondary | ICD-10-CM

## 2023-08-08 DIAGNOSIS — I2510 Atherosclerotic heart disease of native coronary artery without angina pectoris: Secondary | ICD-10-CM | POA: Diagnosis not present

## 2023-08-08 DIAGNOSIS — R0609 Other forms of dyspnea: Secondary | ICD-10-CM | POA: Diagnosis not present

## 2023-08-08 DIAGNOSIS — R42 Dizziness and giddiness: Secondary | ICD-10-CM | POA: Diagnosis not present

## 2023-08-08 DIAGNOSIS — I441 Atrioventricular block, second degree: Secondary | ICD-10-CM

## 2023-08-08 DIAGNOSIS — Z7189 Other specified counseling: Secondary | ICD-10-CM

## 2023-08-08 DIAGNOSIS — I272 Pulmonary hypertension, unspecified: Secondary | ICD-10-CM

## 2023-08-08 DIAGNOSIS — I5020 Unspecified systolic (congestive) heart failure: Secondary | ICD-10-CM

## 2023-08-08 NOTE — H&P (View-Only) (Signed)
 Cardiology Office Note:  .   Date:  08/08/2023  ID:  Guy Espinoza, DOB 12/14/1926, MRN [id number] PCP: Guy Heater, MD  [facility] Providers Cardiologist:  None    History of Present Illness: .   Guy Espinoza is a 89 y.o. male.  Discussed the use of AI scribe software for clinical note transcription with the patient, who gave verbal consent to proceed.  History of Present Illness Guy Espinoza is a 89 year old male with coronary artery disease and heart failure who presents with exertional dyspnea and balance issues.  Exertional dyspnea occurs when bending over or walking around the block and has progressively worsened over several years. A coronary CTA shows a very high calcium score of 1868 with possible severe coronary artery disease in the proximal LAD and proximal OM1. An echocardiogram reveals an ejection fraction of 35-40%, grade two diastolic dysfunction, severely elevated pulmonary artery systolic pressure, severe biatrial enlargement, and frequent PVCs.  He experiences difficulty maintaining balance, especially on uneven ground, with several falls over the past five years, including one resulting in a concussion. He lives at home and his symptoms remain stable compared to a few weeks ago, with no significant worsening. There is no swelling or fluid retention.    ROS: negative except per HPI above.  Studies Reviewed: .        Results RADIOLOGY Coronary CTA: Very high calcium score of 1868, possible severe coronary artery disease in proximal LAD and proximal OM1, unable to perform CT FFR due to image quality and calcium burden.  DIAGNOSTIC Echocardiogram: Ejection fraction 35-40%, grade 2 diastolic dysfunction, severely elevated pulmonary artery systolic pressure, severe biatrial enlargement, frequent PVCs. Risk Assessment/Calculations:       Physical Exam:   VS:  BP (!) 150/64   Pulse (!) 48   Ht 5\' 9"  (1.753 m)   Wt 119 lb 3.2 oz (54.1 kg)   SpO2 95%    BMI 17.60 kg/m    Wt Readings from Last 3 Encounters:  08/08/23 119 lb 3.2 oz (54.1 kg)  07/18/23 117 lb (53.1 kg)  11/06/18 127 lb (57.6 kg)     Physical Exam GENERAL: Alert, cooperative, well developed, no acute distress. HEENT: Normocephalic, normal oropharynx, moist mucous membranes. CHEST: Clear to auscultation bilaterally, no wheezes, rhonchi, or crackles. CARDIOVASCULAR: Normal heart rate and rhythm, S1 and S2 normal without murmurs. ABDOMEN: Soft, non-tender, non-distended, without organomegaly, normal bowel sounds. EXTREMITIES: No cyanosis or edema. NEUROLOGICAL: Cranial nerves grossly intact, moves all extremities without gross motor or sensory deficit.   ASSESSMENT AND PLAN: .    Assessment and Plan Assessment & Plan Coronary artery disease with severe blockages Severe coronary artery disease suspected in proximal LAD and OM1 with high calcium score of 1868. Image quality and calcium burden prevented CT FFR. Severe blockages likely. Symptoms include exertional dyspnea. Heart catheterization offered to assess and potentially treat blockages. Informed consent obtained, risks discussed. - Proceed with heart catheterization to assess coronary artery blockages and evaluate intervention possibilities. - I have extensively discussed risk of cath at his age. He wishes to proceed given lifestyle limiting symptoms according to him and his wife, reiterated multiple times. He does experience falls, I do have some reservations about DAPT but may be ok if short term 3-6 mo.   Heart failure with reduced ejection fraction Echocardiogram shows reduced ejection fraction of 35-40%. Possible relation to coronary blockages. Treatment complicated by hypotension and fall risk. Focus on quality of life improvement. -  Proceed with heart catheterization to assess heart function and pressures. - Will defer starting new heart failure medications until after catheterization results given history of  falls, would like to see if prioritizing diuretic will help the most.   Diastolic dysfunction Echocardiogram shows grade 2 diastolic dysfunction contributing to cardiac dysfunction and symptoms. - Evaluate diastolic function during heart catheterization.  Pulmonary hypertension Echocardiogram indicates severely elevated pulmonary artery systolic pressure. Cause unclear, suspect Group 2. Right heart catheterization planned. - Perform right heart catheterization to measure pressures and assess pulmonary hypertension.   Frequent premature ventricular contractions (PVCs) Frequent PVCs noted on echocardiogram. Significance unclear, further evaluation needed. - Order a 3-day Zio heart monitor to evaluate heart rhythm and PVCs. I'd like to start a beta blocker but would defer if evidence of higher grade AVB, as he had Mobitz I on his recent EKG.   Concussion Concussion due to falls. Concern about safety of lowering blood pressure due to fall risk.  Goals of Care He desires to improve quality of life despite procedural risks. Acknowledges life expectancy limitations at age 23. Prefers intervention to address symptoms limiting quality of life. Understands risks and potential outcomes of heart catheterization.  Informed Consent   Shared Decision Making/Informed Consent The risks [stroke (1 in 1000), death (1 in 1000), kidney failure [usually temporary] (1 in 500), bleeding (1 in 200), allergic reaction [possibly serious] (1 in 200)], benefits (diagnostic support and management of coronary artery disease) and alternatives of a cardiac catheterization were discussed in detail with Guy Espinoza and he is willing to proceed.       Guy Lawman, MD, [facility]

## 2023-08-08 NOTE — Progress Notes (Unsigned)
 Enrolled patient for a 3 day Zio XT monitor to be mailed to patients home

## 2023-08-08 NOTE — Progress Notes (Signed)
 Cardiology Office Note:  .   Date:  08/08/2023  ID:  Guy Espinoza, DOB 12/14/1926, MRN [id number] PCP: Wyn Heater, MD  [facility] Providers Cardiologist:  None    History of Present Illness: .   Guy Espinoza is a 89 y.o. male.  Discussed the use of AI scribe software for clinical note transcription with the patient, who gave verbal consent to proceed.  History of Present Illness Guy Espinoza is a 89 year old male with coronary artery disease and heart failure who presents with exertional dyspnea and balance issues.  Exertional dyspnea occurs when bending over or walking around the block and has progressively worsened over several years. A coronary CTA shows a very high calcium score of 1868 with possible severe coronary artery disease in the proximal LAD and proximal OM1. An echocardiogram reveals an ejection fraction of 35-40%, grade two diastolic dysfunction, severely elevated pulmonary artery systolic pressure, severe biatrial enlargement, and frequent PVCs.  He experiences difficulty maintaining balance, especially on uneven ground, with several falls over the past five years, including one resulting in a concussion. He lives at home and his symptoms remain stable compared to a few weeks ago, with no significant worsening. There is no swelling or fluid retention.    ROS: negative except per HPI above.  Studies Reviewed: .        Results RADIOLOGY Coronary CTA: Very high calcium score of 1868, possible severe coronary artery disease in proximal LAD and proximal OM1, unable to perform CT FFR due to image quality and calcium burden.  DIAGNOSTIC Echocardiogram: Ejection fraction 35-40%, grade 2 diastolic dysfunction, severely elevated pulmonary artery systolic pressure, severe biatrial enlargement, frequent PVCs. Risk Assessment/Calculations:       Physical Exam:   VS:  BP (!) 150/64   Pulse (!) 48   Ht 5\' 9"  (1.753 m)   Wt 119 lb 3.2 oz (54.1 kg)   SpO2 95%    BMI 17.60 kg/m    Wt Readings from Last 3 Encounters:  08/08/23 119 lb 3.2 oz (54.1 kg)  07/18/23 117 lb (53.1 kg)  11/06/18 127 lb (57.6 kg)     Physical Exam GENERAL: Alert, cooperative, well developed, no acute distress. HEENT: Normocephalic, normal oropharynx, moist mucous membranes. CHEST: Clear to auscultation bilaterally, no wheezes, rhonchi, or crackles. CARDIOVASCULAR: Normal heart rate and rhythm, S1 and S2 normal without murmurs. ABDOMEN: Soft, non-tender, non-distended, without organomegaly, normal bowel sounds. EXTREMITIES: No cyanosis or edema. NEUROLOGICAL: Cranial nerves grossly intact, moves all extremities without gross motor or sensory deficit.   ASSESSMENT AND PLAN: .    Assessment and Plan Assessment & Plan Coronary artery disease with severe blockages Severe coronary artery disease suspected in proximal LAD and OM1 with high calcium score of 1868. Image quality and calcium burden prevented CT FFR. Severe blockages likely. Symptoms include exertional dyspnea. Heart catheterization offered to assess and potentially treat blockages. Informed consent obtained, risks discussed. - Proceed with heart catheterization to assess coronary artery blockages and evaluate intervention possibilities. - I have extensively discussed risk of cath at his age. He wishes to proceed given lifestyle limiting symptoms according to him and his wife, reiterated multiple times. He does experience falls, I do have some reservations about DAPT but may be ok if short term 3-6 mo.   Heart failure with reduced ejection fraction Echocardiogram shows reduced ejection fraction of 35-40%. Possible relation to coronary blockages. Treatment complicated by hypotension and fall risk. Focus on quality of life improvement. -  Proceed with heart catheterization to assess heart function and pressures. - Will defer starting new heart failure medications until after catheterization results given history of  falls, would like to see if prioritizing diuretic will help the most.   Diastolic dysfunction Echocardiogram shows grade 2 diastolic dysfunction contributing to cardiac dysfunction and symptoms. - Evaluate diastolic function during heart catheterization.  Pulmonary hypertension Echocardiogram indicates severely elevated pulmonary artery systolic pressure. Cause unclear, suspect Group 2. Right heart catheterization planned. - Perform right heart catheterization to measure pressures and assess pulmonary hypertension.   Frequent premature ventricular contractions (PVCs) Frequent PVCs noted on echocardiogram. Significance unclear, further evaluation needed. - Order a 3-day Zio heart monitor to evaluate heart rhythm and PVCs. I'd like to start a beta blocker but would defer if evidence of higher grade AVB, as he had Mobitz I on his recent EKG.   Concussion Concussion due to falls. Concern about safety of lowering blood pressure due to fall risk.  Goals of Care He desires to improve quality of life despite procedural risks. Acknowledges life expectancy limitations at age 23. Prefers intervention to address symptoms limiting quality of life. Understands risks and potential outcomes of heart catheterization.  Informed Consent   Shared Decision Making/Informed Consent The risks [stroke (1 in 1000), death (1 in 1000), kidney failure [usually temporary] (1 in 500), bleeding (1 in 200), allergic reaction [possibly serious] (1 in 200)], benefits (diagnostic support and management of coronary artery disease) and alternatives of a cardiac catheterization were discussed in detail with Guy Espinoza and he is willing to proceed.       Grady Lawman, MD, [facility]

## 2023-08-08 NOTE — Patient Instructions (Addendum)
 Medication Instructions:  No Changes  Lab Work: Today: BMET and CBC If you have labs (blood work) drawn today and your tests are completely normal, you will receive your results only by: MyChart Message (if you have MyChart) OR A paper copy in the mail If you have any lab test that is abnormal or we need to change your treatment, we will call you to review the results.  Testing/Procedures:  Guy Espinoza- Long Term Monitor Instructions  Your physician has requested you wear a ZIO patch monitor for 3 days.  This is a single patch monitor. [employer] supplies one patch monitor per enrollment. Additional stickers are not available. Please do not apply patch if you will be having a Nuclear Stress Test,  Echocardiogram, Cardiac CT, MRI, or Chest Xray during the period you would be wearing the  monitor. The patch cannot be worn during these tests. You cannot remove and re-apply the  ZIO XT patch monitor.  Your ZIO patch monitor will be mailed 3 day USPS to your address on file. It may take 3-5 days  to receive your monitor after you have been enrolled.  Once you have received your monitor, please review the enclosed instructions. Your monitor  has already been registered assigning a specific monitor serial # to you.  Billing and Patient Assistance Program Information  We have supplied [employer] with any of your insurance information on file for billing purposes. [employer] offers a sliding scale Patient Assistance Program for patients that do not have  insurance, or whose insurance does not completely cover the cost of the ZIO monitor.  You must apply for the Patient Assistance Program to qualify for this discounted rate.  To apply, please call [employer] at [phone], select option 4, select option 2, ask to apply for  Patient Assistance Program. Sanna Crystal will ask your household income, and how many people  are in your household. They will quote your out-of-pocket cost based on that information.   [employer] will also be able to set up a 71-month, interest-free payment plan if needed.  Applying the monitor   Shave hair from upper left chest.  Hold abrader disc by orange tab. Rub abrader in 40 strokes over the upper left chest as  indicated in your monitor instructions.  Clean area with 4 enclosed alcohol pads. Let dry.  Apply patch as indicated in monitor instructions. Patch will be placed under collarbone on left  side of chest with arrow pointing upward.  Rub patch adhesive wings for 2 minutes. Remove white label marked "1". Remove the white  label marked "2". Rub patch adhesive wings for 2 additional minutes.  While looking in a mirror, press and release button in center of patch. A small green light will  flash 3-4 times. This will be your only indicator that the monitor has been turned on.  Do not shower for the first 24 hours. You may shower after the first 24 hours.  Press the button if you feel a symptom. You will hear a small click. Record Date, Time and  Symptom in the Patient Logbook.  When you are ready to remove the patch, follow instructions on the last 2 pages of Patient  Logbook. Stick patch monitor onto the last page of Patient Logbook.  Place Patient Logbook in the blue and white box. Use locking tab on box and tape box closed  securely. The blue and white box has prepaid postage on it. Please place it in the mailbox as  soon as possible.  Your physician should have your test results approximately 7 days after the  monitor has been mailed back to [facility].  Call [facility] Customer Care at [phone] if you have questions regarding  your ZIO XT patch monitor. Call them immediately if you see an orange light blinking on your  monitor.  If your monitor falls off in less than 4 days, contact our Monitor department at [phone].  If your monitor becomes loose or falls off after 4 days call [employer] at [phone] for  suggestions on securing your  monitor    [facility] A DEPT OF [place] [address] A DEPT OF THE [place] [address] [place] [address] Dept: [phone] Loc: [phone]  HAGER ZIMA  08/08/2023  You are scheduled for a Cardiac Catheterization on Thursday, May 15 with Dr. Peter Swaziland.  1. Please arrive at the [facility] (Main Entrance A) at [facility]: [address] [place], [place] [address] at 8:30 AM (This time is 2 hour(s) before your procedure to ensure your preparation).   Free valet parking service is available. You will check in at ADMITTING. The support person will be asked to wait in the waiting room.  It is OK to have someone drop you off and come back when you are ready to be discharged.    Special note: Every effort is made to have your procedure done on time. Please understand that emergencies sometimes delay scheduled procedures.  2. Diet: Do not eat solid foods after midnight.  The patient may have clear liquids until 5am upon the day of the procedure.  3. Labs: You will need to have blood drawn at your office visit on 08/08/2023.  4. Medication instructions in preparation for your procedure:   Contrast Allergy: No   Stop taking, HTCZ (Hydrochlorothiazide) Thursday, Aug 10, 2023 (morning of your procedure or night before)   On the morning of your procedure, take your Aspirin 81 mg and any morning medicines NOT listed above.  You may use sips of water.  5. Plan to go home the same day, you will only stay overnight if medically necessary. 6. Bring a current list of your medications and current insurance cards. 7. You MUST have a responsible person to drive you home. 8. Someone MUST be with you the first 24 hours after you arrive home or your discharge will be delayed. 9. Please wear clothes that are easy to get on and off and wear slip-on shoes.  Thank you for allowing us  to care for you!   -- Seth Ward  Invasive Cardiovascular services   Follow-Up: At [facility], you and your health needs are our priority.  As part of our continuing mission to provide you with exceptional heart care, our providers are all part of one team.  This team includes your primary Cardiologist (physician) and Advanced Practice Providers or APPs (Physician Assistants and Nurse Practitioners) who all work together to provide you with the care you need, when you need it.  Your next appointment:    After Heart Cath  Provider:    Marcie Sever, PA-C, Dayna Dunn, PA-C, Callie Goodrich, PA-C, Hao Meng, PA-C, or Marlana Silvan, NP        We recommend signing up for the patient portal called "MyChart".  Sign up information is provided on this After Visit Summary.  MyChart is used to connect with patients for Virtual Visits (Telemedicine).  Patients  are able to view lab/test results, encounter notes, upcoming appointments, etc.  Non-urgent messages can be sent to your provider as well.   To learn more about what you can do with MyChart, go to [email or web address].

## 2023-08-09 ENCOUNTER — Telehealth: Payer: Self-pay | Admitting: *Deleted

## 2023-08-09 LAB — BASIC METABOLIC PANEL WITH GFR
BUN/Creatinine Ratio: 22 (ref 10–24)
BUN: 20 mg/dL (ref 10–36)
CO2: 23 mmol/L (ref 20–29)
Calcium: 9.8 mg/dL (ref 8.6–10.2)
Chloride: 101 mmol/L (ref 96–106)
Creatinine, Ser: 0.91 mg/dL (ref 0.76–1.27)
Glucose: 94 mg/dL (ref 70–99)
Potassium: 4.6 mmol/L (ref 3.5–5.2)
Sodium: 140 mmol/L (ref 134–144)
eGFR: 78 mL/min/{1.73_m2} (ref >59)

## 2023-08-09 LAB — CBC
Hematocrit: 37.5 % (ref 37.5–51.0)
Hemoglobin: 12.5 g/dL — ABNORMAL LOW (ref 13.0–17.7)
MCH: 32.1 pg (ref 26.6–33.0)
MCHC: 33.3 g/dL (ref 31.5–35.7)
MCV: 96 fL (ref 79–97)
Platelets: 263 10*3/uL (ref 150–450)
RBC: 3.89 x10E6/uL — ABNORMAL LOW (ref 4.14–5.80)
RDW: 11.9 % (ref 11.6–15.4)
WBC: 9 10*3/uL (ref 3.4–10.8)

## 2023-08-09 NOTE — Telephone Encounter (Signed)
 Cardiac Catheterization scheduled at [facility] for: Thursday Aug 10, 2023 10:30 AM Arrival time [facility] Main Entrance A at: 8:30 AM  Nothing to eat after midnight prior to procedure, clear liquids until 5 AM day of procedure.  Medication instructions: -Usual morning medications can be taken with sips of water including aspirin 81 mg.  Plan to go home the same day, you will only stay overnight if medically necessary.  You must have responsible adult to drive you home.  Someone must be with you the first 24 hours after you arrive home.  Reviewed procedure instructions with patient's wife (DPR), Jannette.

## 2023-08-10 ENCOUNTER — Ambulatory Visit (HOSPITAL_COMMUNITY)
Admission: RE | Admit: 2023-08-10 | Discharge: 2023-08-10 | Disposition: A | Discharge status: Home / Self Care | Attending: Cardiology | Admitting: Cardiology

## 2023-08-10 ENCOUNTER — Encounter (HOSPITAL_COMMUNITY): Admission: RE | Disposition: A | Discharge status: Home / Self Care | Source: Home / Self Care | Attending: Cardiology

## 2023-08-10 ENCOUNTER — Encounter (HOSPITAL_COMMUNITY): Payer: Self-pay | Admitting: Cardiology

## 2023-08-10 ENCOUNTER — Other Ambulatory Visit: Payer: Self-pay

## 2023-08-10 DIAGNOSIS — Z9181 History of falling: Secondary | ICD-10-CM | POA: Diagnosis not present

## 2023-08-10 DIAGNOSIS — I272 Pulmonary hypertension, unspecified: Secondary | ICD-10-CM | POA: Insufficient documentation

## 2023-08-10 DIAGNOSIS — I2510 Atherosclerotic heart disease of native coronary artery without angina pectoris: Secondary | ICD-10-CM | POA: Insufficient documentation

## 2023-08-10 DIAGNOSIS — I5020 Unspecified systolic (congestive) heart failure: Secondary | ICD-10-CM

## 2023-08-10 DIAGNOSIS — R0609 Other forms of dyspnea: Secondary | ICD-10-CM | POA: Diagnosis present

## 2023-08-10 DIAGNOSIS — I2584 Coronary atherosclerosis due to calcified coronary lesion: Secondary | ICD-10-CM | POA: Diagnosis not present

## 2023-08-10 DIAGNOSIS — I5032 Chronic diastolic (congestive) heart failure: Secondary | ICD-10-CM | POA: Insufficient documentation

## 2023-08-10 DIAGNOSIS — I493 Ventricular premature depolarization: Secondary | ICD-10-CM | POA: Insufficient documentation

## 2023-08-10 HISTORY — PX: RIGHT/LEFT HEART CATH AND CORONARY ANGIOGRAPHY: CATH118266

## 2023-08-10 SURGERY — RIGHT/LEFT HEART CATH AND CORONARY ANGIOGRAPHY
Anesthesia: LOCAL

## 2023-08-10 MED ORDER — IOHEXOL 350 MG/ML SOLN
INTRAVENOUS | Status: DC | PRN
  Administered 2023-08-10: 45 mL

## 2023-08-10 MED ORDER — LIDOCAINE HCL (PF) 1 % IJ SOLN
INTRAMUSCULAR | Status: DC | PRN
  Administered 2023-08-10: 1 mL
  Administered 2023-08-10: 2 mL

## 2023-08-10 MED ORDER — LIDOCAINE HCL (PF) 1 % IJ SOLN
INTRAMUSCULAR | Status: AC
  Filled 2023-08-10: qty 30

## 2023-08-10 MED ORDER — SODIUM CHLORIDE 0.9 % IV SOLN: INTRAVENOUS | Status: DC

## 2023-08-10 MED ORDER — ASPIRIN 81 MG PO CHEW: 81.0000 mg | CHEWABLE_TABLET | Freq: Once | ORAL | Status: DC

## 2023-08-10 MED ORDER — HEPARIN (PORCINE) IN NACL 1000-0.9 UT/500ML-% IV SOLN
INTRAVENOUS | Status: DC | PRN
Start: 2023-08-10 — End: 2023-08-10
  Administered 2023-08-10 (×2): 500 mL

## 2023-08-10 MED ORDER — HEPARIN SODIUM (PORCINE) 1000 UNIT/ML IJ SOLN
INTRAMUSCULAR | Status: AC
  Filled 2023-08-10: qty 10

## 2023-08-10 MED ORDER — VERAPAMIL HCL 2.5 MG/ML IV SOLN
INTRAVENOUS | Status: AC
  Filled 2023-08-10: qty 2

## 2023-08-10 MED ORDER — HEPARIN SODIUM (PORCINE) 1000 UNIT/ML IJ SOLN
INTRAMUSCULAR | Status: DC | PRN
  Administered 2023-08-10: 3000 [IU] via INTRAVENOUS

## 2023-08-10 MED ORDER — VERAPAMIL HCL 2.5 MG/ML IV SOLN
INTRAVENOUS | Status: DC | PRN
  Administered 2023-08-10: 10 mL via INTRA_ARTERIAL

## 2023-08-10 SURGICAL SUPPLY — 11 items
CATH BALLN WEDGE 5F 110CM (CATHETERS) IMPLANT
CATH INFINITI 5 FR JL3.5 (CATHETERS) IMPLANT
CATH INFINITI JR4 5F (CATHETERS) IMPLANT
DEVICE RAD TR BAND REGULAR (VASCULAR PRODUCTS) IMPLANT
GLIDESHEATH SLEND SS 6F .021 (SHEATH) IMPLANT
GUIDEWIRE INQWIRE 1.5J.035X260 (WIRE) IMPLANT
PACK CARDIAC CATHETERIZATION (CUSTOM PROCEDURE TRAY) ×1 IMPLANT
SET ATX-X65L (MISCELLANEOUS) IMPLANT
SHEATH GLIDE SLENDER 4/5FR (SHEATH) IMPLANT
SHEATH PROBE COVER 6X72 (BAG) IMPLANT
WIRE HITORQ VERSACORE ST 145CM (WIRE) IMPLANT

## 2023-08-10 NOTE — Interval H&P Note (Signed)
 History and Physical Interval Note:  08/10/2023 10:26 AM  Guy Espinoza  has presented today for surgery, with the diagnosis of cad - doe - heart failure.  The various methods of treatment have been discussed with the patient and family. After consideration of risks, benefits and other options for treatment, the patient has consented to  Procedure(s): RIGHT/LEFT HEART CATH AND CORONARY ANGIOGRAPHY (N/A) as a surgical intervention.  The patient's history has been reviewed, patient examined, no change in status, stable for surgery.  I have reviewed the patient's chart and labs.  Questions were answered to the patient's satisfaction.    Cath Lab Visit (complete for each Cath Lab visit)  Clinical Evaluation Leading to the Procedure:   ACS: No.  Non-ACS:    Anginal Classification: CCS III  Anti-ischemic medical therapy: No Therapy  Non-Invasive Test Results: No non-invasive testing performed  Prior CABG: No previous CABG         Guy Espinoza 08/10/2023 10:26 AM

## 2023-08-10 NOTE — Discharge Instructions (Signed)

## 2023-08-11 ENCOUNTER — Encounter: Payer: Self-pay | Admitting: General Practice

## 2023-08-11 ENCOUNTER — Ambulatory Visit: Attending: General Practice | Admitting: General Practice

## 2023-08-11 ENCOUNTER — Telehealth: Payer: Self-pay | Admitting: *Deleted

## 2023-08-11 VITALS — BP 124/52 | HR 57 | Ht 70.0 in | Wt 116.2 lb

## 2023-08-11 DIAGNOSIS — I493 Ventricular premature depolarization: Secondary | ICD-10-CM

## 2023-08-11 DIAGNOSIS — I2510 Atherosclerotic heart disease of native coronary artery without angina pectoris: Secondary | ICD-10-CM

## 2023-08-11 DIAGNOSIS — I5020 Unspecified systolic (congestive) heart failure: Secondary | ICD-10-CM | POA: Diagnosis not present

## 2023-08-11 DIAGNOSIS — I10 Essential (primary) hypertension: Secondary | ICD-10-CM

## 2023-08-11 DIAGNOSIS — R0609 Other forms of dyspnea: Secondary | ICD-10-CM | POA: Diagnosis not present

## 2023-08-11 LAB — POCT I-STAT EG7
Acid-Base Excess: 0 mmol/L (ref 0.0–2.0)
Acid-Base Excess: 1 mmol/L (ref 0.0–2.0)
Bicarbonate: 24.1 mmol/L (ref 20.0–28.0)
Bicarbonate: 24.9 mmol/L (ref 20.0–28.0)
Calcium, Ion: 1.19 mmol/L (ref 1.15–1.40)
Calcium, Ion: 1.24 mmol/L (ref 1.15–1.40)
HCT: 34 % — ABNORMAL LOW (ref 39.0–52.0)
HCT: 35 % — ABNORMAL LOW (ref 39.0–52.0)
Hemoglobin: 11.6 g/dL — ABNORMAL LOW (ref 13.0–17.0)
Hemoglobin: 11.9 g/dL — ABNORMAL LOW (ref 13.0–17.0)
O2 Saturation: 66 %
O2 Saturation: 67 %
Potassium: 3.6 mmol/L (ref 3.5–5.1)
Potassium: 3.7 mmol/L (ref 3.5–5.1)
Sodium: 141 mmol/L (ref 135–145)
Sodium: 141 mmol/L (ref 135–145)
TCO2: 25 mmol/L (ref 22–32)
TCO2: 26 mmol/L (ref 22–32)
pCO2, Ven: 37.4 mmHg — ABNORMAL LOW (ref 44–60)
pCO2, Ven: 37.9 mmHg — ABNORMAL LOW (ref 44–60)
pH, Ven: 7.416 (ref 7.25–7.43)
pH, Ven: 7.427 (ref 7.25–7.43)
pO2, Ven: 34 mmHg (ref 32–45)
pO2, Ven: 34 mmHg (ref 32–45)

## 2023-08-11 LAB — POCT I-STAT 7, (LYTES, BLD GAS, ICA,H+H)
Acid-base deficit: 4 mmol/L — ABNORMAL HIGH (ref 0.0–2.0)
Bicarbonate: 19.3 mmol/L — ABNORMAL LOW (ref 20.0–28.0)
Calcium, Ion: 0.9 mmol/L — ABNORMAL LOW (ref 1.15–1.40)
HCT: 28 % — ABNORMAL LOW (ref 39.0–52.0)
Hemoglobin: 9.5 g/dL — ABNORMAL LOW (ref 13.0–17.0)
O2 Saturation: 94 %
Potassium: 2.9 mmol/L — ABNORMAL LOW (ref 3.5–5.1)
Sodium: 149 mmol/L — ABNORMAL HIGH (ref 135–145)
TCO2: 20 mmol/L — ABNORMAL LOW (ref 22–32)
pCO2 arterial: 29.5 mmHg — ABNORMAL LOW (ref 32–48)
pH, Arterial: 7.424 (ref 7.35–7.45)
pO2, Arterial: 70 mmHg — ABNORMAL LOW (ref 83–108)

## 2023-08-11 MED ORDER — ATORVASTATIN CALCIUM 20 MG PO TABS: 20.0000 mg | ORAL_TABLET | Freq: Every day | ORAL | 3 refills | Status: DC

## 2023-08-11 MED ORDER — FUROSEMIDE 20 MG PO TABS: ORAL_TABLET | ORAL | Status: DC

## 2023-08-11 MED ORDER — POTASSIUM CHLORIDE ER 10 MEQ PO TBCR: 10.0000 meq | EXTENDED_RELEASE_TABLET | Freq: Every day | ORAL | Status: DC

## 2023-08-11 NOTE — Telephone Encounter (Signed)
 Spoke to pt's wife, Eveleen Hinds. They will be able to come in today for follow up post Cath. She states pt is doing well.

## 2023-08-11 NOTE — Patient Instructions (Signed)
 Medication Instructions:  Saturday and Monday take FUROSEMIDE (Lasix) 20mg   Saturday and Monday take POTASSIUM 10meq  START ATORVASTATIN 20MG  DAILY *If you need a refill on your cardiac medications before your next appointment, please call your pharmacy*  Lab Work: NONE  Follow-Up: At [facility], you and your health needs are our priority.  As part of our continuing mission to provide you with exceptional heart care, our providers are all part of one team.  This team includes your primary Cardiologist (physician) and Advanced Practice Providers or APPs (Physician Assistants and Nurse Practitioners) who all work together to provide you with the care you need, when you need it.  Your next appointment:   3 month(s)  Provider:   Euell Herrlich, MD   -and- AS SCHEDULED WITH OUR EP DEPARTMENT  We recommend signing up for the patient portal called "MyChart".  Sign up information is provided on this After Visit Summary.  MyChart is used to connect with patients for Virtual Visits (Telemedicine).  Patients are able to view lab/test results, encounter notes, upcoming appointments, etc.  Non-urgent messages can be sent to your provider as well.   To learn more about what you can do with MyChart, go to [email or web address].   Other Instructions PLEASE READ AND FOLLOW ATTACHED LOW SALT DIET  FLUID RESTRICTION NO MORE THAN 48 OUNCES DAILY     DASH Eating Plan  (LOW SALT DIET) DASH stands for Dietary Approaches to Stop Hypertension. The DASH eating plan is a healthy eating plan that has been shown to: Lower high blood pressure (hypertension). Reduce your risk for type 2 diabetes, heart disease, and stroke. Help with weight loss. What are tips for following this plan? Reading food labels Check food labels for the amount of salt (sodium) per serving. Choose foods with less than 5 percent of the Daily Value (DV) of sodium. In general, foods with less than 300 milligrams (mg) of sodium  per serving fit into this eating plan. To find whole grains, look for the word "whole" as the first word in the ingredient list. Shopping Buy products labeled as "low-sodium" or "no salt added." Buy fresh foods. Avoid canned foods and pre-made or frozen meals. Cooking Try not to add salt when you cook. Use salt-free seasonings or herbs instead of table salt or sea salt. Check with your health care provider or pharmacist before using salt substitutes. Do not fry foods. Cook foods in healthy ways, such as baking, boiling, grilling, roasting, or broiling. Cook using oils that are good for your heart. These include olive, canola, avocado, soybean, and sunflower oil. Meal planning  Eat a balanced diet. This should include: 4 or more servings of fruits and 4 or more servings of vegetables each day. Try to fill half of your plate with fruits and vegetables. 6-8 servings of whole grains each day. 6 or less servings of lean meat, poultry, or fish each day. 1 oz is 1 serving. A 3 oz (85 g) serving of meat is about the same size as the palm of your hand. One egg is 1 oz (28 g). 2-3 servings of low-fat dairy each day. One serving is 1 cup (237 mL). 1 serving of nuts, seeds, or beans 5 times each week. 2-3 servings of heart-healthy fats. Healthy fats called omega-3 fatty acids are found in foods such as walnuts, flaxseeds, fortified milks, and eggs. These fats are also found in cold-water fish, such as sardines, salmon, and mackerel. Limit how much you eat of:  Canned or prepackaged foods. Food that is high in trans fat, such as fried foods. Food that is high in saturated fat, such as fatty meat. Desserts and other sweets, sugary drinks, and other foods with added sugar. Full-fat dairy products. Do not salt foods before eating. Do not eat more than 4 egg yolks a week. Try to eat at least 2 vegetarian meals a week. Eat more home-cooked food and less restaurant, buffet, and fast food. Lifestyle When  eating at a restaurant, ask if your food can be made with less salt or no salt. If you drink alcohol: Limit how much you have to: 0-1 drink a day if you are male. 0-2 drinks a day if you are male. Know how much alcohol is in your drink. In the [place] one drink is one 12 oz bottle of beer (355 mL), one 5 oz glass of wine (148 mL), or one 1 oz glass of hard liquor (44 mL). General information Avoid eating more than 2,300 mg of salt a day. If you have hypertension, you may need to reduce your sodium intake to 1,500 mg a day. Work with your provider to stay at a healthy body weight or lose weight. Ask what the best weight range is for you. On most days of the week, get at least 30 minutes of exercise that causes your heart to beat faster. This may include walking, swimming, or biking. Work with your provider or dietitian to adjust your eating plan to meet your specific calorie needs. What foods should I eat? Fruits All fresh, dried, or frozen fruit. Canned fruits that are in their natural juice and do not have sugar added to them. Vegetables Fresh or frozen vegetables that are raw, steamed, roasted, or grilled. Low-sodium or reduced-sodium tomato and vegetable juice. Low-sodium or reduced-sodium tomato sauce and tomato paste. Low-sodium or reduced-sodium canned vegetables. Grains Whole-grain or whole-wheat bread. Whole-grain or whole-wheat pasta. Brown rice. Guy Espinoza. Bulgur. Whole-grain and low-sodium cereals. Pita bread. Low-fat, low-sodium crackers. Whole-wheat flour tortillas. Meats and other proteins Skinless chicken or [place]. Ground chicken or [place]. Pork with fat trimmed off. Fish and seafood. Egg whites. Dried beans, peas, or lentils. Unsalted nuts, nut butters, and seeds. Unsalted canned beans. Lean cuts of beef with fat trimmed off. Low-sodium, lean precooked or cured meat, such as sausages or meat loaves. Dairy Low-fat (1%) or fat-free (skim) milk. Reduced-fat, low-fat, or  fat-free cheeses. Nonfat, low-sodium ricotta or cottage cheese. Low-fat or nonfat yogurt. Low-fat, low-sodium cheese. Fats and oils Soft margarine without trans fats. Vegetable oil. Reduced-fat, low-fat, or light mayonnaise and salad dressings (reduced-sodium). Canola, safflower, olive, avocado, soybean, and sunflower oils. Avocado. Seasonings and condiments Herbs. Spices. Seasoning mixes without salt. Other foods Unsalted popcorn and pretzels. Fat-free sweets. The items listed above may not be all the foods and drinks you can have. Talk to a dietitian to learn more. What foods should I avoid? Fruits Canned fruit in a light or heavy syrup. Fried fruit. Fruit in cream or butter sauce. Vegetables Creamed or fried vegetables. Vegetables in a cheese sauce. Regular canned vegetables that are not marked as low-sodium or reduced-sodium. Regular canned tomato sauce and paste that are not marked as low-sodium or reduced-sodium. Regular tomato and vegetable juices that are not marked as low-sodium or reduced-sodium. Vanessa General. Olives. Grains Baked goods made with fat, such as croissants, muffins, or some breads. Dry pasta or rice meal packs. Meats and other proteins Fatty cuts of meat. Ribs. Fried meat. Helene Loader. Bologna, salami,  and other precooked or cured meats, such as sausages or meat loaves, that are not lean and low in sodium. Fat from the back of a pig (fatback). Bratwurst. Salted nuts and seeds. Canned beans with added salt. Canned or smoked fish. Whole eggs or egg yolks. Chicken or [place] with skin. Dairy Whole or 2% milk, cream, and half-and-half. Whole or full-fat cream cheese. Whole-fat or sweetened yogurt. Full-fat cheese. Nondairy creamers. Whipped toppings. Processed cheese and cheese spreads. Fats and oils Butter. Stick margarine. Lard. Shortening. Ghee. Bacon fat. Tropical oils, such as coconut, palm kernel, or palm oil. Seasonings and condiments Onion salt, garlic salt, seasoned salt, table  salt, and sea salt. Worcestershire sauce. Tartar sauce. Barbecue sauce. Teriyaki sauce. Soy sauce, including reduced-sodium soy sauce. Steak sauce. Canned and packaged gravies. Fish sauce. Oyster sauce. Cocktail sauce. Store-bought horseradish. Ketchup. Mustard. Meat flavorings and tenderizers. Bouillon cubes. Hot sauces. Pre-made or packaged marinades. Pre-made or packaged taco seasonings. Relishes. Regular salad dressings. Other foods Salted popcorn and pretzels. The items listed above may not be all the foods and drinks you should avoid. Talk to a dietitian to learn more. Where to find more information National Heart, Lung, and Blood Institute (NHLBI): BuffaloDryCleaner.gl American Heart Association (AHA): heart.org Academy of Nutrition and Dietetics: eatright.org National Kidney Foundation (NKF): kidney.org This information is not intended to replace advice given to you by your health care provider. Make sure you discuss any questions you have with your health care provider. Document Revised: 03/31/2022 Document Reviewed: 03/31/2022 Elsevier Patient Education  2024 [employer].

## 2023-08-11 NOTE — Telephone Encounter (Signed)
 Need to contact pt to ask if he can come in today for Office Visit with Eduard Grad, NP. Had heart Cath on 08/10/23 and we need to see him, per Dr. Chancy Comber. LVMTCB at 0950 am.  *Appointment was booked already, hoping pt is able to come in today (08/11/23) at 2:45 pm.

## 2023-08-11 NOTE — Progress Notes (Signed)
 Cardiology Clinic Note   Patient Name: Guy Espinoza Date of Encounter: 08/11/2023  Primary Care Provider:  Wyn Heater, MD Primary Cardiologist:  Euell Herrlich, MD  Patient Profile    Guy Espinoza 89 year old male presents the clinic today for follow-up evaluation of his coronary artery disease and CHF.  Past Medical History    Past Medical History:  Diagnosis Date   Asthma    Hypertension    Past Surgical History:  Procedure Laterality Date   CATARACT EXTRACTION Left 2007   double hernia repair  2010   HERNIA REPAIR  1984   KNEE SURGERY Left 1992   RIGHT/LEFT HEART CATH AND CORONARY ANGIOGRAPHY N/A 08/10/2023   Procedure: RIGHT/LEFT HEART CATH AND CORONARY ANGIOGRAPHY;  Surgeon: [place], Peter M, MD;  Location: [facility] INVASIVE CV LAB;  Service: Cardiovascular;  Laterality: N/A;   ROTATOR CUFF REPAIR Left 2001   ruptured blood vessel L eye Left 2008    Allergies  No Known Allergies  History of Present Illness    Guy Espinoza has a PMH of coronary artery disease, HFrEF, diastolic dysfunction, pulmonary hypertension,, exertional dyspnea, balance issues, and PVCs.  He was seen in follow-up by Dr. Chancy Comber on 08/08/2023.  He noted exertional dyspnea with bending over and walking around the block.  This had progressively worsened over several years.  His coronary CTA was reviewed which showed a coronary calcium score of 1868.  History of possible severe coronary artery disease along his proximal LAD and ostial OM 1.  His echocardiogram was also reviewed and showed an EF of 35-40%, G2 DD, severe elevated pulmonary artery systolic pressure, and severe biatrial enlargement with frequent PVCs.  He was experiencing difficulty with his balance.  He noted this especially on uneven ground.  He had suffered several falls over the past 5 years.  He was living at home and his symptoms remained stable compared to a few weeks ago.  He had no evidence of swelling or fluid retention.  Due  to results of his coronary CT and symptomology LHC was ordered.  He underwent LHC on 08/10/2023.  He was noted to have proximal LAD 75%, proximal-mid LAD 70%, RPDA 80%, first marginal 50%.  His LVEDP was mildly elevated.  Medical management was recommended along with optimization of his CHF.  He presents to the clinic today for evaluation and states he continues to note some dyspnea with exertion and bending over.  He does note that this is better than it was when he was initially seen.  He has some ecchymosis throughout his right forearm.  Dressings clean dry and intact.  We reviewed his cardiac catheterizations and recommendations for treating CHF.  He and his wife expressed understanding.  I will prescribe furosemide 20 mg to be taken on Saturday and Monday.  We will have him take 10 mill equivalents of potassium as well on Saturday and Monday.  I will prescribe atorvastatin 20 mg daily and repeat his fasting lipids and LFTs in 6 weeks.  Will plan follow-up with EP as scheduled in Dr. Acharya or me in 3 months.  Today he denies chest pain, increase shortness of breath, lower extremity edema, fatigue, palpitations, melena, hematuria, hemoptysis, diaphoresis, weakness, presyncope, syncope, orthopnea, and PND.    Home Medications    Prior to Admission medications   Medication Sig Start Date End Date Taking? Authorizing Provider  acetaminophen (TYLENOL) 650 MG CR tablet Take 1,300 mg by mouth every 8 (eight) hours as needed  for pain.    [provider]  aspirin EC 81 MG tablet Take 81 mg by mouth daily.    [provider]  Ferrous Sulfate (IRON) 325 (65 Fe) MG TABS Take 1 tablet by mouth daily.    [provider]  hydrochlorothiazide (HYDRODIURIL) 25 MG tablet Take 25 mg by mouth daily.    [provider]  losartan (COZAAR) 25 MG tablet Take 25 mg by mouth daily. 06/21/23   [provider]  Multiple Vitamins-Minerals (MULTIVITAMIN ADULT) TABS Take 1 tablet  by mouth daily.    [provider]  Omega-3 Fatty Acids (FISH OIL PO) Take 350 mg by mouth daily.    [provider]  saw palmetto 160 MG capsule Take 160 mg by mouth daily.    [provider]  tamsulosin (FLOMAX) 0.4 MG CAPS capsule Take 0.4 mg by mouth daily after supper.    [provider]  TRELEGY ELLIPTA 100-62.5-25 MCG/ACT AEPB Inhale 1 puff into the lungs daily. 06/21/23   [provider]    Family History    Family History  Problem Relation Age of Onset   Bronchitis Mother    Cancer Mother        ovarian   He indicated that his mother is deceased. He indicated that his father is deceased.  Social History    Social History   Socioeconomic History   Marital status: Married    Spouse name: Not on file   Number of children: 1   Years of education: machine shop classes @ [employer]   Highest education level: Some college, no degree  Occupational History   Not on file  Tobacco Use   Smoking status: Never   Smokeless tobacco: Never  Vaping Use   Vaping status: Never Used  Substance and Sexual Activity   Alcohol use: Never   Drug use: Never   Sexual activity: Not on file  Other Topics Concern   Not on file  Social History Narrative   Lives at home with his wife   Right handed   Caffeine: very rare   Social Drivers of Corporate investment banker Strain: Not on file  Food Insecurity: Not on file  Transportation Needs: Not on file  Physical Activity: Not on file  Stress: Not on file  Social Connections: Not on file  Intimate Partner Violence: Not on file     Review of Systems    General:  No chills, fever, night sweats or weight changes.  Cardiovascular:  No chest pain, dyspnea on exertion, edema, orthopnea, palpitations, paroxysmal nocturnal dyspnea. Dermatological: No rash, lesions/masses Respiratory: No cough, dyspnea Urologic: No hematuria, dysuria Abdominal:   No nausea, vomiting, diarrhea, bright red blood per  rectum, melena, or hematemesis Neurologic:  No visual changes, wkns, changes in mental status. All other systems reviewed and are otherwise negative except as noted above.  Physical Exam    VS:  BP (!) 124/52 (BP Location: Left Arm, Patient Position: Sitting, Cuff Size: Normal)   Pulse (!) 57   Ht 5\' 10"  (1.778 m)   Wt 116 lb 3.2 oz (52.7 kg)   BMI 16.67 kg/m  , BMI Body mass index is 16.67 kg/m. GEN: Well nourished, well developed, in no acute distress. HEENT: normal. Neck: Supple, no JVD, carotid bruits, or masses. Cardiac: RRR, no murmurs, rubs, or gallops. No clubbing, cyanosis, edema.  Radials/DP/PT 2+ and equal bilaterally.  Respiratory:  Respirations regular and unlabored, clear to auscultation bilaterally. GI: Soft,  nontender, nondistended, BS + x 4. MS: no deformity or atrophy. Skin: warm and dry, no rash. Neuro:  Strength and sensation are intact. Psych: Normal affect.  Accessory Clinical Findings    Recent Labs: 08/08/2023: BUN 20; Creatinine, Ser 0.91; Hemoglobin 12.5; Platelets 263; Potassium 4.6; Sodium 140   Recent Lipid Panel No results found for: "CHOL", "TRIG", "HDL", "CHOLHDL", "VLDL", "LDLCALC", "LDLDIRECT"       ECG personally reviewed by me today-none today.    Echocardiogram 08/01/2023  IMPRESSIONS     1. No left ventricular thrombus is seen. Left ventricular ejection  fraction, by estimation, is 35 to 40%. The left ventricle has moderately  decreased function. The left ventricle demonstrates global hypokinesis.  The left ventricular internal cavity size   was mildly dilated. Left ventricular diastolic parameters are consistent  with Grade II diastolic dysfunction (pseudonormalization). Elevated left  atrial pressure.   2. Right ventricular systolic function is normal. The right ventricular  size is mildly enlarged. There is severely elevated pulmonary artery  systolic pressure. The estimated right ventricular systolic pressure is  72.8 mmHg.    3. Left atrial size was severely dilated.   4. Right atrial size was severely dilated.   5. The mitral valve is abnormal. Moderate to severe mitral valve  regurgitation.   6. Tricuspid valve regurgitation is moderate.   7. The aortic valve is grossly normal. Aortic valve regurgitation is  trivial. No aortic stenosis is present.   8. The inferior vena cava is dilated in size with <50% respiratory  variability, suggesting right atrial pressure of 15 mmHg.   Comparison(s): Frequent PVCs are seen during the study.   FINDINGS   Left Ventricle: No left ventricular thrombus is seen. Left ventricular  ejection fraction, by estimation, is 35 to 40%. The left ventricle has  moderately decreased function. The left ventricle demonstrates global  hypokinesis. The left ventricular  internal cavity size was mildly dilated. There is no left ventricular  hypertrophy. Abnormal (paradoxical) septal motion, consistent with left  bundle branch block. Left ventricular diastolic parameters are consistent  with Grade II diastolic dysfunction  (pseudonormalization). Elevated left atrial pressure.   Right Ventricle: The right ventricular size is mildly enlarged. No  increase in right ventricular wall thickness. Right ventricular systolic  function is normal. There is severely elevated pulmonary artery systolic  pressure. The tricuspid regurgitant  velocity is 3.80 m/s, and with an assumed right atrial pressure of 15  mmHg, the estimated right ventricular systolic pressure is 72.8 mmHg.   Left Atrium: Left atrial size was severely dilated.   Right Atrium: Right atrial size was severely dilated.   Pericardium: Trivial pericardial effusion is present.   Mitral Valve: The mitral valve is abnormal. Mild mitral annular  calcification. Moderate to severe mitral valve regurgitation, with  centrally-directed jet.   Tricuspid Valve: The tricuspid valve is normal in structure. Tricuspid  valve regurgitation is  moderate.   Aortic Valve: The aortic valve is grossly normal. Aortic valve  regurgitation is trivial. No aortic stenosis is present. Aortic valve mean  gradient measures 4.0 mmHg. Aortic valve peak gradient measures 7.3 mmHg.  Aortic valve area, by VTI measures 1.43  cm.   Pulmonic Valve: The pulmonic valve was not well visualized.   Aorta: The aortic root was not well visualized.   Venous: The inferior vena cava is dilated in size with less than 50%  respiratory variability, suggesting right atrial pressure of 15 mmHg.   IAS/Shunts: No atrial level shunt  detected by color flow Doppler.    LHC 08/10/2023    Prox LAD lesion is 75% stenosed.   Prox LAD to Mid LAD lesion is 70% stenosed.   RPDA lesion is 80% stenosed.   1st Mrg lesion is 50% stenosed.   LV end diastolic pressure is mildly elevated.   Hemodynamic findings consistent with mild pulmonary hypertension.   Moderate 2 vessel obstructive CAD. Segmental disease in the proximal to mid LAD. Diffuse PDA disease Mildly elevated LV filling pressures. LVEDP 20 mm Hg. PCWP 21/28 with  mean 23 mm Hg Mild pulmonary HTN PAP 58/30, mean 34 mm Hg Cardiac output 4.8 L/min, index 2.9   Plan: based on these findings I suspect his symptoms are mostly related to CHF. Recommend optimization of CHF therapy.  Diagnostic Dominance: Right  Intervention  Assessment & Plan   1.  Combined systolic and diastolic CHF-weight today 116.2 lbs. Does note dyspnea with exertion and bending over. Heart healthy low-sodium diet Elevate lower extremities when not active Lower extremity support stockings Continue losartan, hydrochlorothiazide Start furosemide 20 mg on Saturday and Monday days then stop.  Start potassium 10 mill equivalents on Saturday and Monday days then stop Repeat BMP  at follow-up.  Coronary artery disease, DOE-no chest pain today.  Does note dyspnea which is a little better today with exertion.  Also noticed dyspnea with bending  over.  Underwent cardiac catheterization on 08/10/2023.  He was noted to have proximal LAD 75%, proximal-mid LAD 70 percent, RPDA 80%, first marginal 50% LVEDP was mildly elevated.  It was felt that his symptoms were mostly related to CHF.  Medical management was recommended with the optimization of CHF therapy. Continue aspirin, HCTZ, losartan  Hyperlipidemia-LDL 98 on 12/29/22. High-fiber diet Increase physical activity as tolerated Continue aspirin Start Lipitor 20 mg daily Repeat fasting lipids and LFTs in 6 weeks  PVCs-heart rate today 57 bpm Avoid triggers caffeine, chocolate, EtOH, dehydration etc. Increase physical activity as tolerated    Disposition: Follow-up with EP as scheduled and Dr. Chancy Comber in 3 months.   Chet Cota. Michaeljohn Biss NP-C     08/11/2023, 3:19 PM [facility] [address] Office [phone] Fax [phone]    I spent 14 minutes examining this patient, reviewing medications, and using patient centered shared decision making involving their cardiac care.   I spent  20 minutes reviewing past medical history,  medications, and prior cardiac tests.

## 2023-08-22 NOTE — Progress Notes (Unsigned)
 Cardiology Office Note:   Date:  08/24/2023  ID:  Burhanuddin, Kohlmann 1926/09/07, MRN [id number] PCP: Wyn Heater, MD  [facility] Providers Cardiologist:  Euell Herrlich, MD    History of Present Illness:   Discussed the use of AI scribe software for clinical note transcription with the patient, who gave verbal consent to proceed.  History of Present Illness Guy Espinoza is a 89 y.o. male with a history of high blood pressure, coronary artery disease, aortic atherosclerosis, and high cholesterol. Patient was seen in clinic by Dr. Chancy Comber on 07/18/23 as a new patient with symptoms of exertional dyspnea and balance issues. Patient noted difficulty walking on uneven ground, reported several falls in recent years. Patient also reported shortness of breath with exertion. Given his symptoms, echocardiogram and coronary CTA arranged. Cardiac CTA found severe coronary artery disease suspected in proximal LAD and OM1 with high calcium  score of 1868. Image quality and calcium  burden prevented CT FFR. Given suspected disease and symptoms, cardiac catheterization arranged. Echo showed LVEF 35-40%. No GDMT added at that time with planned LHC. LHC completed on 08/10/23 noted proximal LAD 75%, proximal-mid LAD 70%, RPDA 80%, first marginal 50%. His LVEDP was mildly elevated. Medical management was recommended along with optimization of his CHF. In clinic follow up on 5/16, results reviewed in detail with patient by Lawana Pray, NP. Given elevated LVEDP, patient advised to take Lasix  20mg  twice, the following Saturday and Monday with potassium supplementation. He was also started on Atorvastatin  20mg .   Today patient and spouse report that there was an issue with the Lasix  and potassium prescriptions. These never arrived to the pharmacy and patient did not take.   A recent three-day heart monitor noted high-grade AV block, suspected complete heart block with heart rate as low as 33. No syncope or near  syncope, dizziness, or lightheadedness.   His primary symptom remains exertional intolerance and shortness of breath with any physical activity. Also reports feeling winded while eating. Lying on his back sometimes alleviates the dyspnea. He denies chest pain, dizziness, or lightheadedness. He experiences significant fatigue and weakness, particularly with excessive movement.  After his noon meal, he feels almost sick, with symptoms including coughing and sweating. Patient denies orthopnea as well as swelling in his legs or abdomen.  He has a history of coronary artery disease and congestive heart failure. He has not experienced any focal chest discomfort since his heart catheterization. He continues to take 81 mg of aspirin  daily along with atorvastatin .  He has hypertension and is currently taking hydrochlorothiazide 25 mg daily and losartan 25 mg daily.   Today patient denies chest pain, lower extremity edema, palpitations, melena, hematuria, hemoptysis, diaphoresis, weakness, presyncope, syncope, orthopnea, and PND.   Studies Reviewed:    EKG:   EKG Interpretation Date/Time:  Thursday Aug 24 2023 09:01:46 EDT Ventricular Rate:  52 PR Interval:    QRS Duration:  122 QT Interval:  428 QTC Calculation: 398 R Axis:   -70  Text Interpretation: High grade AV block, AV dissociation with possible blocked PACs Left anterior fascicular block Reconfirmed by Leala Prince [phone]) on 08/24/2023 10:36:38 AM    Echocardiogram 08/01/2023   IMPRESSIONS     1. No left ventricular thrombus is seen. Left ventricular ejection  fraction, by estimation, is 35 to 40%. The left ventricle has moderately  decreased function. The left ventricle demonstrates global hypokinesis.  The left ventricular internal cavity size   was mildly dilated. Left ventricular diastolic  parameters are consistent  with Grade II diastolic dysfunction (pseudonormalization). Elevated left  atrial pressure.   2. Right  ventricular systolic function is normal. The right ventricular  size is mildly enlarged. There is severely elevated pulmonary artery  systolic pressure. The estimated right ventricular systolic pressure is  72.8 mmHg.   3. Left atrial size was severely dilated.   4. Right atrial size was severely dilated.   5. The mitral valve is abnormal. Moderate to severe mitral valve  regurgitation.   6. Tricuspid valve regurgitation is moderate.   7. The aortic valve is grossly normal. Aortic valve regurgitation is  trivial. No aortic stenosis is present.   8. The inferior vena cava is dilated in size with <50% respiratory  variability, suggesting right atrial pressure of 15 mmHg.   Comparison(s): Frequent PVCs are seen during the study.   FINDINGS   Left Ventricle: No left ventricular thrombus is seen. Left ventricular  ejection fraction, by estimation, is 35 to 40%. The left ventricle has  moderately decreased function. The left ventricle demonstrates global  hypokinesis. The left ventricular  internal cavity size was mildly dilated. There is no left ventricular  hypertrophy. Abnormal (paradoxical) septal motion, consistent with left  bundle branch block. Left ventricular diastolic parameters are consistent  with Grade II diastolic dysfunction  (pseudonormalization). Elevated left atrial pressure.   Right Ventricle: The right ventricular size is mildly enlarged. No  increase in right ventricular wall thickness. Right ventricular systolic  function is normal. There is severely elevated pulmonary artery systolic  pressure. The tricuspid regurgitant  velocity is 3.80 m/s, and with an assumed right atrial pressure of 15  mmHg, the estimated right ventricular systolic pressure is 72.8 mmHg.   Left Atrium: Left atrial size was severely dilated.   Right Atrium: Right atrial size was severely dilated.   Pericardium: Trivial pericardial effusion is present.   Mitral Valve: The mitral valve is  abnormal. Mild mitral annular  calcification. Moderate to severe mitral valve regurgitation, with  centrally-directed jet.   Tricuspid Valve: The tricuspid valve is normal in structure. Tricuspid  valve regurgitation is moderate.   Aortic Valve: The aortic valve is grossly normal. Aortic valve  regurgitation is trivial. No aortic stenosis is present. Aortic valve mean  gradient measures 4.0 mmHg. Aortic valve peak gradient measures 7.3 mmHg.  Aortic valve area, by VTI measures 1.43  cm.   Pulmonic Valve: The pulmonic valve was not well visualized.   Aorta: The aortic root was not well visualized.   Venous: The inferior vena cava is dilated in size with less than 50%  respiratory variability, suggesting right atrial pressure of 15 mmHg.   IAS/Shunts: No atrial level shunt detected by color flow Doppler.      LHC 08/10/2023     Prox LAD lesion is 75% stenosed.   Prox LAD to Mid LAD lesion is 70% stenosed.   RPDA lesion is 80% stenosed.   1st Mrg lesion is 50% stenosed.   LV end diastolic pressure is mildly elevated.   Hemodynamic findings consistent with mild pulmonary hypertension.   Moderate 2 vessel obstructive CAD. Segmental disease in the proximal to mid LAD. Diffuse PDA disease Mildly elevated LV filling pressures. LVEDP 20 mm Hg. PCWP 21/28 with  mean 23 mm Hg Mild pulmonary HTN PAP 58/30, mean 34 mm Hg Cardiac output 4.8 L/min, index 2.9   Plan: based on these findings I suspect his symptoms are mostly related to CHF. Recommend optimization of CHF therapy.  Diagnostic Dominance: Right   Risk Assessment/Calculations:              Physical Exam:   VS:  BP 126/78   Pulse (!) 49   Ht 5\' 9"  (1.753 m)   Wt 114 lb 3.2 oz (51.8 kg)   SpO2 94%   BMI 16.86 kg/m    Wt Readings from Last 3 Encounters:  08/24/23 114 lb 3.2 oz (51.8 kg)  08/11/23 116 lb 3.2 oz (52.7 kg)  08/10/23 118 lb (53.5 kg)     Physical Exam Vitals reviewed.  Constitutional:       Comments: Frail appearing  HENT:     Head:     Comments: Perioral pallor noted, however no conjunctival pallor. Eyes:     Pupils: Pupils are equal, round, and reactive to light.  Cardiovascular:     Rate and Rhythm: Bradycardia present. Rhythm irregular.     Pulses: Normal pulses.     Heart sounds: Normal heart sounds.  Pulmonary:     Effort: Pulmonary effort is normal. No respiratory distress.     Breath sounds: Normal breath sounds. No rales.     Comments: Appears mildly short of breath with any exertion Abdominal:     General: Abdomen is flat.     Palpations: Abdomen is soft.  Musculoskeletal:     Right lower leg: No edema.     Left lower leg: No edema.  Skin:    General: Skin is warm and dry.     Capillary Refill: Capillary refill takes less than 2 seconds.  Neurological:     General: No focal deficit present.     Mental Status: He is alert and oriented to person, place, and time.  Psychiatric:        Mood and Affect: Mood normal.        Behavior: Behavior normal.        Thought Content: Thought content normal.        Judgment: Judgment normal.     ASSESSMENT AND PLAN:    Assessment & Plan Heart block Patient previously seen with mobitz I second degree AVB now seen with high-grade AV block/intermittent complete heart block on recent 3 day Zio (final interpretation pending). Also noted on clinic ECG today with AV dissociation, junctional escape rate 50s. Question if there may also be blocked PACs. Appears similar to tracing from clinic on 07/18/23. No syncope or near syncope reported. Continues to have symptoms that include exertional intolerance and shortness of breath with physical activity.  - Reviewed with Dr. Lavonne Prairie and Dr. Carolynne Citron today. Given lack of dizziness/syncope, does not feel that ED is warranted at this time. Dr. Carolynne Citron recommends outpatient EP referral with potential for pacemaker consideration. Not clear that patient a candidate due to age/frailty/fall risk  and this was discussed with patient.  - Discussed warning signs of severe bradycardia including dizziness, lightheadedness, and syncope, which would necessitate emergency department evaluation. - Place stat referral to electrophysiology for evaluation of heart rhythm. - Advise to seek emergency care if symptoms such as lightheadedness, dizziness, or syncope occur.  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 Recent echocardiogram showed LVEF of 35-40% with global hypokinesis. No obvious volume overload on exam today, lungs without rales/crackles. Exertional dyspnea and intolerance likely multifactorial, including contributions from heart failure. Ambulatory O2 saturation checked today, decreased from 95% to 91%.  - Based on ambulatory O2 saturation, does not  qualify for O2 - Check NT-proBNP today - Consider a short course of oral Lasix  if NT-proBNP is significantly elevated. - Will defer GDMT changes today pending labs. Could consider adding SGLT2 and replacing hydrochlorothiazide with Spironolactone as patient recently hypokalemic.  - No beta blocker  Coronary artery disease No focal chest discomfort reported since heart catheterization that showed moderate 2 vessel obstructive CAD with segmental disease in proximal to mid LAD and diffuse PDA disease. As noted above, he continues with chronic exertional dyspnea and significant fatigue. These symptoms likely of multiple etiologies. Continue medical management.  - Continue aspirin  81 mg daily. - Continue atorvastatin . - No beta blocker with AVB.   Hypertension Blood pressure well-managed today.  - Continue hydrochlorothiazide 25 mg daily. - Continue losartan 25 mg daily. - As above, assess labs and consider changes for HF GDMT  Hyperlipidemia Per lab review, LDL 98mg /dL in October 0981. Recently started on Atorvastatin  20mg . - Too  soon to recheck lipids and LFTs today, needs in 6-8 weeks.   Goals of Care Discussed the importance of considering the level of aggressiveness in treatment given his age of 66. Emphasized the need to weigh the benefits and complications of treatments and procedures. Suggested considering a geriatric and palliative care specialist to optimize care.          Signed, Leala Prince, PA-C

## 2023-08-23 ENCOUNTER — Telehealth: Payer: Self-pay | Admitting: Internal Medicine

## 2023-08-23 NOTE — Telephone Encounter (Signed)
 Spoke with pts spouse. She verified pts name and DOB. Pts spouse is aware of the readings from the patch wear. Pts spouse hasn't noticed any dizziness or syncope symptoms in pt. She is aware that if pt starts having symptoms, he is to be evaluated by the ED. Pt will keep apt with Even Broadus Canes PA tomorrow morning 08/24/23 @ 8:50 AM.

## 2023-08-23 NOTE — Telephone Encounter (Signed)
 Calling with abn reading Ref# [id number]

## 2023-08-23 NOTE — Telephone Encounter (Signed)
  Patch Wear Time:  3 days and 0 hours (2025-05-17T12:46:07-398 to 2025-05-20T12:48:23-0400)   Patient had a min HR of 31 bpm, max HR of 169 bpm, and avg HR of 55 bpm. Predominant underlying rhythm was Sinus Rhythm. First Degree AV Block was present. Bundle Branch Block/IVCD was present. 59 Ventricular Tachycardia runs occurred, the run with the  fastest interval lasting 4 beats with a max rate of 169 bpm, the longest lasting 5 beats with an avg rate of 107 bpm. 2200 episode(s) of AV Block (3rd) occurred, lasting a total of 19 hours 4 mins. Second Degree AV Block-Mobitz I (Wenckebach) was  present. No Isolated SVEs, SVE Couplets, or SVE Triplets were present. Isolated VEs were frequent (14.0%, 31954), VE Couplets were occasional (3.6%, 4139), and VE Triplets were rare (<1.0%, 434). Ventricular Bigeminy and Trigeminy were present. MD  notification criteria for Complete Heart Block met - report posted prior to notification (AL).

## 2023-08-23 NOTE — Telephone Encounter (Signed)
 Discussed with Lawana Pray NP will review report ./cy

## 2023-08-24 ENCOUNTER — Ambulatory Visit: Attending: Cardiology | Admitting: Cardiology

## 2023-08-24 ENCOUNTER — Encounter: Payer: Self-pay | Admitting: Cardiology

## 2023-08-24 VITALS — BP 126/78 | HR 49 | Ht 69.0 in | Wt 114.2 lb

## 2023-08-24 DIAGNOSIS — I4430 Unspecified atrioventricular block: Secondary | ICD-10-CM | POA: Insufficient documentation

## 2023-08-24 DIAGNOSIS — I2510 Atherosclerotic heart disease of native coronary artery without angina pectoris: Secondary | ICD-10-CM | POA: Diagnosis not present

## 2023-08-24 DIAGNOSIS — I5020 Unspecified systolic (congestive) heart failure: Secondary | ICD-10-CM | POA: Insufficient documentation

## 2023-08-24 DIAGNOSIS — I272 Pulmonary hypertension, unspecified: Secondary | ICD-10-CM

## 2023-08-24 DIAGNOSIS — I10 Essential (primary) hypertension: Secondary | ICD-10-CM | POA: Diagnosis not present

## 2023-08-24 NOTE — Patient Instructions (Addendum)
 Medication Instructions:   Your physician recommends that you continue on your current medications as directed. Please refer to the Current Medication list given to you today.  *If you need a refill on your cardiac medications before your next appointment, please call your pharmacy*   You have been referred to SEE ELECTROPHYSIOLOGIST DR. PARKER HERE IN THIS OFFICE TOMORROW 08/25/23 AT 9:30 AM--PLEASE ARRIVE 15 MINS PRIOR TO THIS APPOINTNMENT   Lab Work:  [facility] FIRST FLOOR AT [facility]--BMET, CBC, TSH, AND PRO-BNP  If you have labs (blood work) drawn today and your tests are completely normal, you will receive your results only by: MyChart Message (if you have MyChart) OR A paper copy in the mail If you have any lab test that is abnormal or we need to change your treatment, we will call you to review the results.     Follow-Up:  1.)  TOMORROW 5/30 AT 9:30 AM WITH DR. PARKER (ELECTROPHYSIOLOGIST)  2.)  IN 2 WEEKS WITH JESSE CLEAVER NP ON FRIDAY 09/08/23 AT 10:05 AM

## 2023-08-24 NOTE — Progress Notes (Unsigned)
 Electrophysiology Office Note:   Date:  08/25/2023  ID:  Guy Espinoza, Guy Espinoza [birth year], MRN [id number]  Primary Cardiologist: Euell Herrlich, MD Electrophysiologist: None      History of Present Illness:   Guy Espinoza is a 89 y.o. male with h/o high blood pressure, coronary artery disease, aortic atherosclerosis, and high cholesterol who is being seen today for evaluation for pacemaker.   Discussed the use of AI scribe software for clinical note transcription with the patient, who gave verbal consent to proceed.  History of Present Illness Patient was seen in clinic by Dr. Chancy Comber on 07/18/23 as a new patient with symptoms of exertional dyspnea and balance issues.  Zio monitor was ordered which showed 2200 episode(s) of AV Block (3rd) occurred, lasting a total of 19 hours 4 mins. He was seen by Leala Prince, PA yesterday and had ECG with high grade AV block. His wife has noticed fatigue and shortness of breath with activity, but patient states 'I don't go after anything I can't handle.' Sitting down and resting alleviates any breathing difficulties he experiences. He also mentions difficulty bending over to pick things up from the floor. He is generally able to take care of himself, including dressing, bathing, and preparing meals. He can mow the yard using a riding mower but requires assistance to dispose of the clippings. He uses a cane and holds onto objects for support when moving around but ambulates frequently.  Review of systems complete and found to be negative unless listed in HPI.   EP Information / Studies Reviewed:    EKG is not ordered today. EKG from 08/24/23 reviewed which showed high grade AV block.      Zio 07/2023:  Patch Wear Time:  3 days and 0 hours (2025-05-17T12:46:07-398 to 2025-05-20T12:48:23-0400)   Patient had a min HR of 31 bpm, max HR of 169 bpm, and avg HR of 55 bpm. Predominant underlying rhythm was Sinus Rhythm. First Degree AV Block was present. Bundle  Branch Block/IVCD was present. 59 Ventricular Tachycardia runs occurred, the run with the  fastest interval lasting 4 beats with a max rate of 169 bpm, the longest lasting 5 beats with an avg rate of 107 bpm. 2200 episode(s) of AV Block (3rd) occurred, lasting a total of 19 hours 4 mins. Second Degree AV Block-Mobitz I (Wenckebach) was  present. No Isolated SVEs, SVE Couplets, or SVE Triplets were present. Isolated VEs were frequent (14.0%, 31954), VE Couplets were occasional (3.6%, 4139), and VE Triplets were rare (<1.0%, 434). Ventricular Bigeminy and Trigeminy were present. MD  notification criteria for Complete Heart Block met - report posted prior to notification (AL).  Echo 08/01/23:   1. No left ventricular thrombus is seen. Left ventricular ejection  fraction, by estimation, is 35 to 40%. The left ventricle has moderately  decreased function. The left ventricle demonstrates global hypokinesis.  The left ventricular internal cavity size   was mildly dilated. Left ventricular diastolic parameters are consistent  with Grade II diastolic dysfunction (pseudonormalization). Elevated left  atrial pressure.   2. Right ventricular systolic function is normal. The right ventricular  size is mildly enlarged. There is severely elevated pulmonary artery  systolic pressure. The estimated right ventricular systolic pressure is  72.8 mmHg.   3. Left atrial size was severely dilated.   4. Right atrial size was severely dilated.   5. The mitral valve is abnormal. Moderate to severe mitral valve  regurgitation.   6. Tricuspid valve regurgitation is moderate.  7. The aortic valve is grossly normal. Aortic valve regurgitation is  trivial. No aortic stenosis is present.   8. The inferior vena cava is dilated in size with <50% respiratory  variability, suggesting right atrial pressure of 15 mmHg.   RHC/LHC 08/10/23:    Prox LAD lesion is 75% stenosed.   Prox LAD to Mid LAD lesion is 70% stenosed.    RPDA lesion is 80% stenosed.   1st Mrg lesion is 50% stenosed.   LV end diastolic pressure is mildly elevated.   Hemodynamic findings consistent with mild pulmonary hypertension.   Moderate 2 vessel obstructive CAD. Segmental disease in the proximal to mid LAD. Diffuse PDA disease Mildly elevated LV filling pressures. LVEDP 20 mm Hg. PCWP 21/28 with  mean 23 mm Hg Mild pulmonary HTN PAP 58/30, mean 34 mm Hg Cardiac output 4.8 L/min, index 2.9   Physical Exam:   VS:  BP (!) 96/42   Pulse (!) 52   Ht 5\' 9"  (1.753 m)   Wt 111 lb (50.3 kg)   SpO2 96%   BMI 16.39 kg/m    Wt Readings from Last 3 Encounters:  08/25/23 111 lb (50.3 kg)  08/24/23 114 lb 3.2 oz (51.8 kg)  08/11/23 116 lb 3.2 oz (52.7 kg)     GEN: Well nourished, well developed in no acute distress NECK: No JVD CARDIAC: Normal rate, irregular rhythm RESPIRATORY:  Clear to auscultation without rales, wheezing or rhonchi  ABDOMEN: Soft, non-distended EXTREMITIES:  No edema; No deformity   ASSESSMENT AND PLAN:    #. High grade AV block: Associated with dypnea and fatigue. - Patient meets criteria for permanent pacemaker implant. Given his advanced age, we had a risks vs benefits discussion regarding pacemaker implant. He voiced understanding. He lives independently with his wife and performs all activities of daily living. He is cognitively intact. Would like to extend quantity and quality of life as much as possible. Explained risks, benefits, and alternatives to pacemaker implantation, including but not limited to bleeding, infection, damage to heart or lungs, heart attack, stroke, or death.  Pt verbalized understanding and agrees to proceed. - We will implant a dual chamber pacemaker with left bundle branch area pacing lead for conduction system pacing. Will not implant conventional CRT in efforts to minimize procedure time and device size due to advanced age.   #. CAD:  - Continue aspirin  81mg  and atorvastatin  20mg   daily.  #. Chronic systolic heart failure: LVEF 35-40%. Well compensated on exam today. - We will implant a dual chamber pacemaker with left bundle branch area pacing lead for conduction system pacing. Will not implant conventional CRT in efforts to minimize procedure time and device size due to advanced age.    Signed, [name], MD

## 2023-08-24 NOTE — H&P (View-Only) (Signed)
 Electrophysiology Office Note:   Date:  08/25/2023  ID:  Zaryan, Yakubov [birth year], MRN [id number]  Primary Cardiologist: Euell Herrlich, MD Electrophysiologist: None      History of Present Illness:   Guy Espinoza is a 89 y.o. male with h/o high blood pressure, coronary artery disease, aortic atherosclerosis, and high cholesterol who is being seen today for evaluation for pacemaker.   Discussed the use of AI scribe software for clinical note transcription with the patient, who gave verbal consent to proceed.  History of Present Illness Patient was seen in clinic by Dr. Chancy Comber on 07/18/23 as a new patient with symptoms of exertional dyspnea and balance issues.  Zio monitor was ordered which showed 2200 episode(s) of AV Block (3rd) occurred, lasting a total of 19 hours 4 mins. He was seen by Leala Prince, PA yesterday and had ECG with high grade AV block. His wife has noticed fatigue and shortness of breath with activity, but patient states 'I don't go after anything I can't handle.' Sitting down and resting alleviates any breathing difficulties he experiences. He also mentions difficulty bending over to pick things up from the floor. He is generally able to take care of himself, including dressing, bathing, and preparing meals. He can mow the yard using a riding mower but requires assistance to dispose of the clippings. He uses a cane and holds onto objects for support when moving around but ambulates frequently.  Review of systems complete and found to be negative unless listed in HPI.   EP Information / Studies Reviewed:    EKG is not ordered today. EKG from 08/24/23 reviewed which showed high grade AV block.      Zio 07/2023:  Patch Wear Time:  3 days and 0 hours (2025-05-17T12:46:07-398 to 2025-05-20T12:48:23-0400)   Patient had a min HR of 31 bpm, max HR of 169 bpm, and avg HR of 55 bpm. Predominant underlying rhythm was Sinus Rhythm. First Degree AV Block was present. Bundle  Branch Block/IVCD was present. 59 Ventricular Tachycardia runs occurred, the run with the  fastest interval lasting 4 beats with a max rate of 169 bpm, the longest lasting 5 beats with an avg rate of 107 bpm. 2200 episode(s) of AV Block (3rd) occurred, lasting a total of 19 hours 4 mins. Second Degree AV Block-Mobitz I (Wenckebach) was  present. No Isolated SVEs, SVE Couplets, or SVE Triplets were present. Isolated VEs were frequent (14.0%, 31954), VE Couplets were occasional (3.6%, 4139), and VE Triplets were rare (<1.0%, 434). Ventricular Bigeminy and Trigeminy were present. MD  notification criteria for Complete Heart Block met - report posted prior to notification (AL).  Echo 08/01/23:   1. No left ventricular thrombus is seen. Left ventricular ejection  fraction, by estimation, is 35 to 40%. The left ventricle has moderately  decreased function. The left ventricle demonstrates global hypokinesis.  The left ventricular internal cavity size   was mildly dilated. Left ventricular diastolic parameters are consistent  with Grade II diastolic dysfunction (pseudonormalization). Elevated left  atrial pressure.   2. Right ventricular systolic function is normal. The right ventricular  size is mildly enlarged. There is severely elevated pulmonary artery  systolic pressure. The estimated right ventricular systolic pressure is  72.8 mmHg.   3. Left atrial size was severely dilated.   4. Right atrial size was severely dilated.   5. The mitral valve is abnormal. Moderate to severe mitral valve  regurgitation.   6. Tricuspid valve regurgitation is moderate.  7. The aortic valve is grossly normal. Aortic valve regurgitation is  trivial. No aortic stenosis is present.   8. The inferior vena cava is dilated in size with <50% respiratory  variability, suggesting right atrial pressure of 15 mmHg.   RHC/LHC 08/10/23:    Prox LAD lesion is 75% stenosed.   Prox LAD to Mid LAD lesion is 70% stenosed.    RPDA lesion is 80% stenosed.   1st Mrg lesion is 50% stenosed.   LV end diastolic pressure is mildly elevated.   Hemodynamic findings consistent with mild pulmonary hypertension.   Moderate 2 vessel obstructive CAD. Segmental disease in the proximal to mid LAD. Diffuse PDA disease Mildly elevated LV filling pressures. LVEDP 20 mm Hg. PCWP 21/28 with  mean 23 mm Hg Mild pulmonary HTN PAP 58/30, mean 34 mm Hg Cardiac output 4.8 L/min, index 2.9   Physical Exam:   VS:  BP (!) 96/42   Pulse (!) 52   Ht 5\' 9"  (1.753 m)   Wt 111 lb (50.3 kg)   SpO2 96%   BMI 16.39 kg/m    Wt Readings from Last 3 Encounters:  08/25/23 111 lb (50.3 kg)  08/24/23 114 lb 3.2 oz (51.8 kg)  08/11/23 116 lb 3.2 oz (52.7 kg)     GEN: Well nourished, well developed in no acute distress NECK: No JVD CARDIAC: Normal rate, irregular rhythm RESPIRATORY:  Clear to auscultation without rales, wheezing or rhonchi  ABDOMEN: Soft, non-distended EXTREMITIES:  No edema; No deformity   ASSESSMENT AND PLAN:    #. High grade AV block: Associated with dypnea and fatigue. - Patient meets criteria for permanent pacemaker implant. Given his advanced age, we had a risks vs benefits discussion regarding pacemaker implant. He voiced understanding. He lives independently with his wife and performs all activities of daily living. He is cognitively intact. Would like to extend quantity and quality of life as much as possible. Explained risks, benefits, and alternatives to pacemaker implantation, including but not limited to bleeding, infection, damage to heart or lungs, heart attack, stroke, or death.  Pt verbalized understanding and agrees to proceed. - We will implant a dual chamber pacemaker with left bundle branch area pacing lead for conduction system pacing. Will not implant conventional CRT in efforts to minimize procedure time and device size due to advanced age.   #. CAD:  - Continue aspirin  81mg  and atorvastatin  20mg   daily.  #. Chronic systolic heart failure: LVEF 35-40%. Well compensated on exam today. - We will implant a dual chamber pacemaker with left bundle branch area pacing lead for conduction system pacing. Will not implant conventional CRT in efforts to minimize procedure time and device size due to advanced age.    Signed, [name], MD

## 2023-08-25 ENCOUNTER — Encounter: Payer: Self-pay | Admitting: *Deleted

## 2023-08-25 ENCOUNTER — Ambulatory Visit: Attending: Cardiology | Admitting: Cardiology

## 2023-08-25 ENCOUNTER — Encounter: Payer: Self-pay | Admitting: Cardiology

## 2023-08-25 ENCOUNTER — Ambulatory Visit: Payer: Self-pay | Admitting: Cardiology

## 2023-08-25 VITALS — BP 96/42 | HR 52 | Ht 69.0 in | Wt 111.0 lb

## 2023-08-25 DIAGNOSIS — I2510 Atherosclerotic heart disease of native coronary artery without angina pectoris: Secondary | ICD-10-CM | POA: Diagnosis not present

## 2023-08-25 DIAGNOSIS — I493 Ventricular premature depolarization: Secondary | ICD-10-CM | POA: Diagnosis not present

## 2023-08-25 DIAGNOSIS — Z8679 Personal history of other diseases of the circulatory system: Secondary | ICD-10-CM | POA: Diagnosis not present

## 2023-08-25 DIAGNOSIS — I441 Atrioventricular block, second degree: Secondary | ICD-10-CM | POA: Diagnosis not present

## 2023-08-25 DIAGNOSIS — I5022 Chronic systolic (congestive) heart failure: Secondary | ICD-10-CM

## 2023-08-25 LAB — TSH: TSH: 0.863 u[IU]/mL (ref 0.450–4.500)

## 2023-08-25 LAB — CBC
Hematocrit: 37.5 % (ref 37.5–51.0)
Hemoglobin: 12.2 g/dL — ABNORMAL LOW (ref 13.0–17.7)
MCH: 31.8 pg (ref 26.6–33.0)
MCHC: 32.5 g/dL (ref 31.5–35.7)
MCV: 98 fL — ABNORMAL HIGH (ref 79–97)
Platelets: 230 10*3/uL (ref 150–450)
RBC: 3.84 x10E6/uL — ABNORMAL LOW (ref 4.14–5.80)
RDW: 11.9 % (ref 11.6–15.4)
WBC: 8.2 10*3/uL (ref 3.4–10.8)

## 2023-08-25 LAB — BASIC METABOLIC PANEL WITH GFR
BUN/Creatinine Ratio: 33 — ABNORMAL HIGH (ref 10–24)
BUN: 29 mg/dL (ref 10–36)
CO2: 22 mmol/L (ref 20–29)
Calcium: 9.7 mg/dL (ref 8.6–10.2)
Chloride: 102 mmol/L (ref 96–106)
Creatinine, Ser: 0.89 mg/dL (ref 0.76–1.27)
Glucose: 107 mg/dL — ABNORMAL HIGH (ref 70–99)
Potassium: 4.1 mmol/L (ref 3.5–5.2)
Sodium: 142 mmol/L (ref 134–144)
eGFR: 79 mL/min/{1.73_m2} (ref >59)

## 2023-08-25 LAB — PRO B NATRIURETIC PEPTIDE: NT-Pro BNP: 973 pg/mL — ABNORMAL HIGH (ref 0–486)

## 2023-08-25 MED ORDER — POTASSIUM CHLORIDE CRYS ER 10 MEQ PO TBCR: EXTENDED_RELEASE_TABLET | ORAL | 0 refills | Status: DC

## 2023-08-25 MED ORDER — FUROSEMIDE 20 MG PO TABS: ORAL_TABLET | ORAL | 0 refills | Status: DC

## 2023-08-25 NOTE — Telephone Encounter (Signed)
 The patient has been notified of the result and verbalized understanding.  All questions (if any) were answered.  Pt aware we will send in 2 doses of lasix  20 mg once daily with a day in between dosing with concurrent use of KDUR 10 mEq once daily.  Pt  is aware instructions for lasix  and potassium will say as so:  Take lasix  20 mg once daily on Saturday (5/31) and Monday (6/2) in concurrence with KDUR 10 mEq once daily on Saturday (5/31) and Monday (6/2) only.   Reiterated to him that he will NOT take either of these medications on Sunday (6/1), for we want him to have a day in between dosing.    Pt is aware they will recheck his labs in the hospital on 6/4 at pacemaker implantation.   Confirmed the pharmacy of choice with the pt and wife.  Both verbalized understanding and agrees with this plan.

## 2023-08-25 NOTE — Telephone Encounter (Signed)
-----   Message from Leala Prince sent at 08/25/2023  3:13 PM EDT ----- Patient's labs reflect a degree of retained fluid which may be contributing to his shortness of breath. I would like for him to take 2 doses of Lasix  20mg , with a day in between (with concurrent 10mEq potassium supplementation). Ex: Take Saturday and Monday or Sunday and Tuesday. Please send Lasix  20mg  once daily x2 doses with 10mEq potassium x2 doses. Potassium should be taken with the Lasix . Labs will be repeated when he receives his pacemaker next week.

## 2023-08-25 NOTE — Patient Instructions (Signed)
 Medication Instructions:  Your physician recommends that you continue on your current medications as directed. Please refer to the Current Medication list given to you today.  *If you need a refill on your cardiac medications before your next appointment, please call your pharmacy*  Lab Work: None ordered  If you have any lab test that is abnormal or we need to change your treatment, we will call you to review the results.  Testing/Procedures: Your physician has recommended that you have a pacemaker inserted. A pacemaker is a small device that is placed under the skin of your chest or abdomen to help control abnormal heart rhythms. This device uses electrical pulses to prompt the heart to beat at a normal rate. Pacemakers are used to treat heart rhythms that are too slow. Wire (leads) are attached to the pacemaker that goes into the chambers of you heart. This is done in the hospital and usually requires and overnight stay. Please see the instruction sheet given to you today for more information.    Follow-Up: At [facility], you and your health needs are our priority.  As part of our continuing mission to provide you with exceptional heart care, our providers are all part of one team.  This team includes your primary Cardiologist (physician) and Advanced Practice Providers or APPs (Physician Assistants and Nurse Practitioners) who all work together to provide you with the care you need, when you need it.  Your next appointment:   2 week(s)  Provider:   Device clinic for a wound check     Thank you for choosing [facility]!!   Reece Cane, RN [phone]   Other Instructions  Pacemaker Implantation, Adult Pacemaker implantation is a procedure to put a pacemaker in the chest. A pacemaker is a small computer that sends electrical signals to the heart. This helps the heart beat normally. Some pacemakers keep information about heart rhythms. You may need this procedure  if you have: A slow heartbeat (bradycardia). Repeated fainting (syncope), or you often have dizziness or light-headedness because of an irregular heart rate. A weak heart that is not pumping enough blood to your body. The pacemaker can help your heart chambers beat in sync. The pacemaker attaches to your heart with a wire called a lead. One or two leads may be needed. There are different types of pacemakers: Transvenous pacemaker. This type is placed under the skin or muscle of your upper chest. The lead goes through a vein in the chest to the inside of the heart. Epicardial pacemaker. This type is placed under the skin or muscle of your chest or belly. The lead goes through your chest to the outside of the heart. Tell a health care provider about: Any allergies you have. All medicines you are taking, including vitamins, herbs, eye drops, creams, and over-the-counter medicines. Any problems you or family members have had with anesthesia. Any bleeding problems or bone disorders you have. Any surgeries you have had. Any medical conditions you have. Whether you are pregnant or may be pregnant. What are the risks? Your health care provider will talk with you about risks. These may include: Infection. Bleeding. Failure of the pacemaker or lead. A collapsed lung or bleeding into a lung. A blood clot inside a blood vessel with a lead. Heart damage. Infection inside the heart (endocarditis). Allergic reactions to medicines. What happens before the procedure? When to stop eating and drinking Follow instructions from your provider about what you may eat and drink. These  may include: 8 hours before your procedure Stop eating most foods. Do not eat meat, fried foods, or fatty foods. Eat only light foods, such as toast or crackers. All liquids are okay except energy drinks and alcohol. 6 hours before your procedure Stop eating. Drink only clear liquids, such as water, clear fruit juice, black  coffee, plain tea, and sports drinks. Do not drink energy drinks or alcohol. 2 hours before your procedure Stop drinking all liquids. You may be allowed to take medicines with small sips of water. If you do not follow your provider's instructions, your procedure may be delayed or canceled. Medicines Ask your provider about: Changing or stopping your regular medicines. These include any diabetes medicines or blood thinners you take. Taking medicines such as aspirin  and ibuprofen. These medicines can thin your blood. Do not take them unless your provider tells you to. Taking over-the-counter medicines, vitamins, herbs, and supplements. Tests You may have: A heart test. This may include: An electrocardiogram (ECG). Patches will be put on your skin to check your heart rhythm. A chest X-ray. An echocardiogram. Sound waves (ultrasound) are used to get an image of the heart. A cardiac rhythm monitor. This records your heart rhythm and any events for a longer period of time. Blood tests. Genetic testing. General instructions Do not use any products that contain nicotine or tobacco. These products include cigarettes, chewing tobacco, and vaping devices, such as e-cigarettes. If you need help quitting, ask your provider. Ask your provider: How your surgery site will be marked. What steps will be taken to help prevent infection. These steps may include: Removing hair at the surgery site. Washing skin with a soap that kills germs. Receiving antibiotics. If you will be going home right after the procedure, plan to have a responsible adult: Take you home from the hospital or clinic. You will not be allowed to drive. Care for you for the time you are told. What happens during the procedure? An IV will be inserted into one of your veins. You may be given: A sedative. This helps you relax. Anesthesia. This keeps you from feeling pain. It will make you fall asleep for surgery. The next steps vary  depending on which pacemaker you will be getting. If you are getting a transvenous pacemaker: An incision will be made in your upper chest. A pocket will be made for the pacemaker. It may be placed under the skin or between layers of muscle. The lead will be put into a blood vessel that goes to the heart. While X-rays are taken by an imaging machine (fluoroscopy), the lead will be moved through the vein to the inside of your heart. The other end of the lead will be put under the skin and attached to the pacemaker. If you are getting an epicardial pacemaker: An incision will be made near your ribs or breastbone (sternum) for the lead. The lead will be attached to the outside of your heart. Another incision will be made in your chest or upper belly to make a pocket for the pacemaker. The free end of the lead will be moved under the skin and attached to the pacemaker. The pacemaker will be tested. Pictures may be taken to check the lead position. The incisions will be closed with stitches (sutures), tape strips, or skin glue. Bandages (dressings) will be placed over the incisions. The procedure may vary among providers and hospitals. What happens after the procedure? Your blood pressure, heart rate, breathing rate, and blood oxygen  level will be monitored until you leave the hospital or clinic. You may be given antibiotics. You will be given pain medicine. An ECG and chest X-rays will be done. Your provider will program the pacemaker. If you were given a sedative during the procedure, it can affect you for several hours. Do not drive or operate machinery until your provider says that it is safe. You will be given a pacemaker identification card. This card lists the implant date, device model, and manufacturer of your pacemaker. This information is not intended to replace advice given to you by your health care provider. Make sure you discuss any questions you have with your health care  provider. Document Revised: 01/21/2022 Document Reviewed: 01/21/2022 Elsevier Patient Education  2024 [employer].

## 2023-08-28 ENCOUNTER — Ambulatory Visit: Admitting: Student

## 2023-08-29 ENCOUNTER — Telehealth: Payer: Self-pay

## 2023-08-29 NOTE — Pre-Procedure Instructions (Signed)
Attempted to call patient regarding procedure instructions.  Left voice mail on the following items: Arrival time 1:00 Nothing to eat or drink after midnight No meds AM of procedure Responsible person to drive you home and stay with you for 24 hrs Wash with special soap night before and morning of procedure   

## 2023-08-29 NOTE — Telephone Encounter (Signed)
 Informed the patient's wife his PPM implant has been moved to 6/4 at 1030 with an 0830 arrival time. He was grateful for call and agreed with plan.

## 2023-08-30 ENCOUNTER — Ambulatory Visit (HOSPITAL_COMMUNITY)

## 2023-08-30 ENCOUNTER — Other Ambulatory Visit: Payer: Self-pay

## 2023-08-30 ENCOUNTER — Encounter: Payer: Self-pay | Admitting: Emergency Medicine

## 2023-08-30 ENCOUNTER — Encounter (HOSPITAL_COMMUNITY)
Admission: RE | Disposition: A | Discharge status: Home / Self Care | Payer: Self-pay | Source: Home / Self Care | Attending: Cardiology

## 2023-08-30 ENCOUNTER — Ambulatory Visit (HOSPITAL_COMMUNITY)
Admission: RE | Admit: 2023-08-30 | Discharge: 2023-08-30 | Disposition: A | Discharge status: Home / Self Care | Attending: Cardiology | Admitting: Cardiology

## 2023-08-30 DIAGNOSIS — I2510 Atherosclerotic heart disease of native coronary artery without angina pectoris: Secondary | ICD-10-CM | POA: Insufficient documentation

## 2023-08-30 DIAGNOSIS — I5022 Chronic systolic (congestive) heart failure: Secondary | ICD-10-CM | POA: Diagnosis not present

## 2023-08-30 DIAGNOSIS — Z7982 Long term (current) use of aspirin: Secondary | ICD-10-CM | POA: Diagnosis not present

## 2023-08-30 DIAGNOSIS — I442 Atrioventricular block, complete: Secondary | ICD-10-CM | POA: Diagnosis not present

## 2023-08-30 DIAGNOSIS — I4430 Unspecified atrioventricular block: Secondary | ICD-10-CM

## 2023-08-30 DIAGNOSIS — Z79899 Other long term (current) drug therapy: Secondary | ICD-10-CM | POA: Diagnosis not present

## 2023-08-30 HISTORY — PX: PACEMAKER IMPLANT: EP1218

## 2023-08-30 SURGERY — PACEMAKER IMPLANT

## 2023-08-30 MED ORDER — LIDOCAINE HCL (PF) 1 % IJ SOLN
INTRAMUSCULAR | Status: AC
Start: 2023-08-30 — End: ?
  Filled 2023-08-30: qty 30

## 2023-08-30 MED ORDER — FENTANYL CITRATE (PF) 100 MCG/2ML IJ SOLN
INTRAMUSCULAR | Status: DC | PRN
  Administered 2023-08-30: 12.5 ug via INTRAVENOUS
  Administered 2023-08-30: 25 ug via INTRAVENOUS
  Administered 2023-08-30: 12.5 ug via INTRAVENOUS

## 2023-08-30 MED ORDER — CHLORHEXIDINE GLUCONATE 4 % EX SOLN
4.0000 | Freq: Once | CUTANEOUS | Status: AC
Start: 2023-08-30 — End: 2023-08-30
  Administered 2023-08-30: 4 via TOPICAL
  Filled 2023-08-30: qty 60

## 2023-08-30 MED ORDER — CEFAZOLIN SODIUM-DEXTROSE 2-4 GM/100ML-% IV SOLN
2.0000 g | INTRAVENOUS | Status: AC
  Administered 2023-08-30: 2 g via INTRAVENOUS

## 2023-08-30 MED ORDER — MIDAZOLAM HCL 2 MG/2ML IJ SOLN
INTRAMUSCULAR | Status: AC
  Filled 2023-08-30: qty 2

## 2023-08-30 MED ORDER — GENTAMICIN SULFATE 40 MG/ML IJ SOLN
INTRAMUSCULAR | Status: AC
  Filled 2023-08-30: qty 2

## 2023-08-30 MED ORDER — ONDANSETRON HCL 4 MG/2ML IJ SOLN: 4.0000 mg | Freq: Four times a day (QID) | INTRAMUSCULAR | Status: DC | PRN

## 2023-08-30 MED ORDER — SODIUM CHLORIDE 0.9 % IV SOLN
80.0000 mg | INTRAVENOUS | Status: AC
  Administered 2023-08-30: 80 mg

## 2023-08-30 MED ORDER — FENTANYL CITRATE (PF) 100 MCG/2ML IJ SOLN
INTRAMUSCULAR | Status: AC
  Filled 2023-08-30: qty 2

## 2023-08-30 MED ORDER — SODIUM CHLORIDE 0.9 % IV SOLN: INTRAVENOUS | Status: DC

## 2023-08-30 MED ORDER — ACETAMINOPHEN 325 MG PO TABS: 325.0000 mg | ORAL_TABLET | ORAL | Status: DC | PRN

## 2023-08-30 MED ORDER — ASPIRIN EC 81 MG PO TBEC: 81.0000 mg | DELAYED_RELEASE_TABLET | Freq: Every day | ORAL | 11 refills | Status: DC

## 2023-08-30 MED ORDER — CEFAZOLIN SODIUM-DEXTROSE 2-4 GM/100ML-% IV SOLN
INTRAVENOUS | Status: AC
  Filled 2023-08-30: qty 100

## 2023-08-30 MED ORDER — LIDOCAINE HCL (PF) 1 % IJ SOLN
INTRAMUSCULAR | Status: DC | PRN
  Administered 2023-08-30: 60 mL

## 2023-08-30 MED ORDER — HEPARIN (PORCINE) IN NACL 1000-0.9 UT/500ML-% IV SOLN
INTRAVENOUS | Status: DC | PRN
  Administered 2023-08-30: 500 mL

## 2023-08-30 SURGICAL SUPPLY — 14 items
CABLE SURGICAL S-101-97-12 (CABLE) ×1 IMPLANT
CATH CPS LOCATOR 3D MED (CATHETERS) IMPLANT
LEAD ULTIPACE 52 LPA1231/52 (Lead) IMPLANT
LEAD ULTIPACE 65 LPA1231/65 (Lead) IMPLANT
PACEMAKER ASSURITY DR-RF (Pacemaker) IMPLANT
PAD DEFIB RADIO PHYSIO CONN (PAD) ×1 IMPLANT
POUCH AIGIS-R ANTIBACT PPM (Mesh General) ×1 IMPLANT
POUCH AIGIS-R ANTIBACT PPM MED (Mesh General) IMPLANT
SHEATH 7FR PRELUDE SNAP 13 (SHEATH) IMPLANT
SHEATH 9FR PRELUDE SNAP 13 (SHEATH) IMPLANT
SLITTER AGILIS HISPRO (INSTRUMENTS) IMPLANT
TOOL HELIX LOCKING (MISCELLANEOUS) IMPLANT
TRAY PACEMAKER INSERTION (PACKS) ×1 IMPLANT
WIRE HITORQ VERSACORE ST 145CM (WIRE) IMPLANT

## 2023-08-30 NOTE — Progress Notes (Signed)
 MD parker notified of CXR results. Patient okay to dc

## 2023-08-30 NOTE — Interval H&P Note (Signed)
 History and Physical Interval Note:  08/30/2023 1:49 PM  Guy Espinoza  has presented today for surgery, with the diagnosis of complete heart block.  The various methods of treatment have been discussed with the patient and family. After consideration of risks, benefits and other options for treatment, the patient has consented to  Procedure(s): PACEMAKER IMPLANT (N/A) as a surgical intervention.  The patient's history has been reviewed, patient examined, no change in status, stable for surgery.  I have reviewed the patient's chart and labs.  Questions were answered to the patient's satisfaction.     [name]

## 2023-08-30 NOTE — Discharge Instructions (Addendum)
 Resume aspirin  in 5 days.  Return to device clinic on Friday (6/6) afternoon for pressure dressing removal.   After Your Cardiac Device  ACTIVITY Do not lift your arm above shoulder height for 1 week after your procedure. After 7 days, you may progress as below.  You should remove your sling 24 hours after your procedure, unless otherwise instructed by your provider.     Wednesday September 06, 2023  Thursday September 07, 2023 Friday September 08, 2023 Saturday September 09, 2023   Do not lift, push, pull, or carry anything over 10 pounds with the affected arm until 6 weeks (Wednesday October 11, 2023 ) after your procedure.   You may drive AFTER your wound check, unless you have been told otherwise by your provider.   Ask your healthcare provider when you can go back to work   INCISION/Dressing If you are on a blood thinner such as Coumadin, Xarelto, Eliquis, Plavix, or Pradaxa please confirm with your provider when this should be resumed.   If a PRESSURE DRESSING (a bulky dressing that usually goes up over your shoulder) was applied or left in place, please follow instructions given by your provider on when to return to have this removed.   Monitor your cardiac device site for redness, swelling, and drainage. Call the device clinic at [phone] if you experience these symptoms or fever/chills.  If you were discharged in a sling, please do not wear this during the day more than 48 hours after your surgery unless otherwise instructed. This may increase the risk of stiffness and soreness in your shoulder.   Avoid lotions, ointments, or perfumes over your incision until it is well-healed.  You may use a hot tub or a pool AFTER your wound check appointment if the incision is completely closed.   DEVICE MANAGEMENT You should receive your ID card for your new device in 4-8 weeks. Keep this card with you at all times once received. Consider wearing a medical alert bracelet or necklace.  Please follow  manufacture instructions for keeping your device charged carefully. This should be performed every 2 weeks at the very least.   Your cardiac device may be MRI compatible. This will be discussed at your next office visit/wound check.  You should avoid contact with strong electric or magnetic fields.    Call the office right away if: You have chest pain. You feel more short of breath than you have felt before. You feel more light-headed than you have felt before. Your incision starts to open up.  This information is not intended to replace advice given to you by your health care provider. Make sure you discuss any questions you have with your health care provider.

## 2023-08-31 ENCOUNTER — Telehealth: Payer: Self-pay

## 2023-08-31 ENCOUNTER — Encounter (HOSPITAL_COMMUNITY): Payer: Self-pay | Admitting: Cardiology

## 2023-08-31 NOTE — Telephone Encounter (Signed)
 Spoke w/ patients wife - patient is scheduled for 6/6 at 11am for removal of pressure dressing on ppm site.

## 2023-08-31 NOTE — Telephone Encounter (Signed)
-----   Message from [name] sent at 08/30/2023  5:03 PM EDT ----- Regarding: Pressure dressing removal Can we schedule patient to come in on Friday afternoon (6/6) for removal of his pressure dressing? He was a submuscular implant.   Josh

## 2023-08-31 NOTE — Telephone Encounter (Signed)
 Please schedule device clinic apt 09/01/23 in morning. No later than 11 due to staffing.   Thank you.

## 2023-09-01 ENCOUNTER — Ambulatory Visit: Attending: Cardiology | Admitting: *Deleted

## 2023-09-01 NOTE — Progress Notes (Signed)
 Seen in device clinic today to remove pressure dressing over pacemaker site. Dressing removed. Steri-strips remain intact over incision. Old bruising noted across majority of patient's left chest and left upper arm areas. Some slight puffiness noted around patient's left pectoral area. No new bruising noted. Picture taken and uploaded into patient's chart for comparison during next week's visit. Patient and spouse educated on arm and lift restrictions until they return next week. Both acknowledged understanding of restrictions. Patient enrolled in remote checks. All questions answered.  No charge for visit today.

## 2023-09-06 NOTE — Progress Notes (Signed)
 Cardiology Clinic Note   Patient Name: Guy Espinoza Date of Encounter: 09/08/2023  Primary Care Provider:  Wyn Heater, MD Primary Cardiologist:  Euell Herrlich, MD  Patient Profile    Guy Espinoza 89 year old male presents the clinic today for follow-up evaluation of his coronary artery disease and CHF.  Past Medical History    Past Medical History:  Diagnosis Date   Asthma    Hypertension    Past Surgical History:  Procedure Laterality Date   CATARACT EXTRACTION Left 2007   double hernia repair  2010   HERNIA REPAIR  1984   KNEE SURGERY Left 1992   PACEMAKER IMPLANT N/A 08/30/2023   Procedure: PACEMAKER IMPLANT;  Surgeon: [name], MD;  Location: [facility] INVASIVE CV LAB;  Service: Cardiovascular;  Laterality: N/A;   RIGHT/LEFT HEART CATH AND CORONARY ANGIOGRAPHY N/A 08/10/2023   Procedure: RIGHT/LEFT HEART CATH AND CORONARY ANGIOGRAPHY;  Surgeon: [place], Peter M, MD;  Location: [facility] INVASIVE CV LAB;  Service: Cardiovascular;  Laterality: N/A;   ROTATOR CUFF REPAIR Left 2001   ruptured blood vessel L eye Left 2008    Allergies  No Known Allergies  History of Present Illness    Guy Espinoza has a PMH of coronary artery disease, HFrEF, diastolic dysfunction, pulmonary hypertension,, exertional dyspnea, balance issues, and PVCs.  He was seen in follow-up by Dr. Chancy Comber on 08/08/2023.  He noted exertional dyspnea with bending over and walking around the block.  This had progressively worsened over several years.  His coronary CTA was reviewed which showed a coronary calcium  score of 1868.  History of possible severe coronary artery disease along his proximal LAD and ostial OM 1.  His echocardiogram was also reviewed and showed an EF of 35-40%, G2 DD, severe elevated pulmonary artery systolic pressure, and severe biatrial enlargement with frequent PVCs.  He was experiencing difficulty with his balance.  He noted this especially on uneven ground.  He had suffered several  falls over the past 5 years.  He was living at home and his symptoms remained stable compared to a few weeks ago.  He had no evidence of swelling or fluid retention.  Due to results of his coronary CT and symptomology LHC was ordered.  He underwent LHC on 08/10/2023.  He was noted to have proximal LAD 75%, proximal-mid LAD 70%, RPDA 80%, first marginal 50%.  His LVEDP was mildly elevated.  Medical management was recommended along with optimization of his CHF.  He presented to the clinic 08/11/23 for evaluation and stated he continued to note some dyspnea with exertion and bending over.  He did note that this was better than it was when he was initially seen.  He had some ecchymosis throughout his right forearm.  Dressings were clean dry and intact.  We reviewed his cardiac catheterizations and recommendations for treating CHF.  He and his wife expressed understanding.  I will prescribe furosemide  20 mg to be taken on Saturday and Monday.  We will have him take 10 mill equivalents of potassium as well on Saturday and Monday.  I will prescribe atorvastatin  20 mg daily and repeat his fasting lipids and LFTs in 6 weeks.  Will plan follow-up with EP as scheduled in Dr. Acharya or me in 3 months.  He was seen in follow-up by Leala Prince, PA-C on 08/24/2023.  He noted that he had not taken furosemide  or potassium due to an issue with the pharmacy.  He had worn a 3-day heart  monitor which noted high-grade AV block, suspected complete heart block with a heart rate as low as 33 bpm.  He noted exertional intolerance and shortness of breath with physical activity.  He was seen by Dr. Daneil Dunker on 08/25/2023.  He noted exertional dyspnea and balance issues.  His cardiac event monitor was reviewed.  He noted fatigue and shortness of breath with activity.  With sitting his symptoms would dissipate.  He reported that he continued to use a riding lawnmower to cut his grass but needed help disposing of the clippings.  Benefits and  risks of implanting a dual-chamber pacemaker were reviewed.  He agreed to proceed and underwent pacemaker implantation on 08/30/2023.  He presents to the clinic today for follow-up evaluation and states his breathing is better since having his pacemaker placed.  We reviewed his cardiac catheterization.  He and his wife expressed understanding.  He reports compliance with his medications.  He has been taking atorvastatin .  I will add this to his medication list.  I will repeat his fasting lipids and LFTs in about 6 weeks.  His pacemaker pocket is healing well.  No signs of infection.  Steri-Strips in place.  We reviewed his upcoming visit with the EP.  I will plan follow-up in 3 months.  Today he denies chest pain, increase shortness of breath, lower extremity edema, fatigue, palpitations, melena, hematuria, hemoptysis, diaphoresis, weakness, presyncope, syncope, orthopnea, and PND.    Home Medications    Prior to Admission medications   Medication Sig Start Date End Date Taking? Authorizing Provider  acetaminophen  (TYLENOL ) 650 MG CR tablet Take 1,300 mg by mouth every 8 (eight) hours as needed for pain.    [provider]  aspirin  EC 81 MG tablet Take 81 mg by mouth daily.    [provider]  Ferrous Sulfate (IRON) 325 (65 Fe) MG TABS Take 1 tablet by mouth daily.    [provider]  hydrochlorothiazide (HYDRODIURIL) 25 MG tablet Take 25 mg by mouth daily.    [provider]  losartan (COZAAR) 25 MG tablet Take 25 mg by mouth daily. 06/21/23   [provider]  Multiple Vitamins-Minerals (MULTIVITAMIN ADULT) TABS Take 1 tablet by mouth daily.    [provider]  Omega-3 Fatty Acids (FISH OIL PO) Take 350 mg by mouth daily.    [provider]  saw palmetto 160 MG capsule Take 160 mg by mouth daily.    [provider]  tamsulosin (FLOMAX) 0.4 MG CAPS capsule Take 0.4 mg by mouth daily after supper.    [provider]   TRELEGY ELLIPTA 100-62.5-25 MCG/ACT AEPB Inhale 1 puff into the lungs daily. 06/21/23   [provider]    Family History    Family History  Problem Relation Age of Onset   Bronchitis Mother    Cancer Mother        ovarian   He indicated that his mother is deceased. He indicated that his father is deceased.  Social History    Social History   Socioeconomic History   Marital status: Married    Spouse name: Not on file   Number of children: 1   Years of education: machine shop classes @ [employer]   Highest education level: Some college, no degree  Occupational History   Not on file  Tobacco Use   Smoking status: Never   Smokeless tobacco: Never  Vaping Use   Vaping status: Never Used  Substance and Sexual Activity  Alcohol use: Never   Drug use: Never   Sexual activity: Not on file  Other Topics Concern   Not on file  Social History Narrative   Lives at home with his wife   Right handed   Caffeine: very rare   Social Drivers of Corporate investment banker Strain: Not on file  Food Insecurity: Not on file  Transportation Needs: Not on file  Physical Activity: Not on file  Stress: Not on file  Social Connections: Not on file  Intimate Partner Violence: Not on file     Review of Systems    General:  No chills, fever, night sweats or weight changes.  Cardiovascular:  No chest pain, dyspnea on exertion, edema, orthopnea, palpitations, paroxysmal nocturnal dyspnea. Dermatological: No rash, lesions/masses Respiratory: No cough, dyspnea Urologic: No hematuria, dysuria Abdominal:   No nausea, vomiting, diarrhea, bright red blood per rectum, melena, or hematemesis Neurologic:  No visual changes, wkns, changes in mental status. All other systems reviewed and are otherwise negative except as noted above.  Physical Exam    VS:  BP 118/60   Pulse 69   Ht 5' 9 (1.753 m)   Wt 117 lb (53.1 kg)   SpO2 97%   BMI 17.28 kg/m  , BMI Body mass index is 17.28  kg/m. GEN: Well nourished, well developed, in no acute distress. HEENT: normal. Neck: Supple, no JVD, carotid bruits, or masses. Cardiac: RRR, no murmurs, rubs, or gallops. No clubbing, cyanosis, edema.  Radials/DP/PT 2+ and equal bilaterally.  Respiratory:  Respirations regular and unlabored, clear to auscultation bilaterally. GI: Soft, nontender, nondistended, BS + x 4. MS: no deformity or atrophy. Skin: warm and dry, no rash.  Surgical site healing well no signs of infection Neuro:  Strength and sensation are intact. Psych: Normal affect.  Accessory Clinical Findings    Recent Labs: 08/24/2023: BUN 29; Creatinine, Ser 0.89; Hemoglobin 12.2; NT-Pro BNP 973; Platelets 230; Potassium 4.1; Sodium 142; TSH 0.863   Recent Lipid Panel No results found for: CHOL, TRIG, HDL, CHOLHDL, VLDL, LDLCALC, LDLDIRECT       ECG personally reviewed by me today-none today.    Echocardiogram 08/01/2023  IMPRESSIONS     1. No left ventricular thrombus is seen. Left ventricular ejection  fraction, by estimation, is 35 to 40%. The left ventricle has moderately  decreased function. The left ventricle demonstrates global hypokinesis.  The left ventricular internal cavity size   was mildly dilated. Left ventricular diastolic parameters are consistent  with Grade II diastolic dysfunction (pseudonormalization). Elevated left  atrial pressure.   2. Right ventricular systolic function is normal. The right ventricular  size is mildly enlarged. There is severely elevated pulmonary artery  systolic pressure. The estimated right ventricular systolic pressure is  72.8 mmHg.   3. Left atrial size was severely dilated.   4. Right atrial size was severely dilated.   5. The mitral valve is abnormal. Moderate to severe mitral valve  regurgitation.   6. Tricuspid valve regurgitation is moderate.   7. The aortic valve is grossly normal. Aortic valve regurgitation is  trivial. No aortic stenosis is  present.   8. The inferior vena cava is dilated in size with <50% respiratory  variability, suggesting right atrial pressure of 15 mmHg.   Comparison(s): Frequent PVCs are seen during the study.   FINDINGS   Left Ventricle: No left ventricular thrombus is seen. Left ventricular  ejection fraction, by estimation, is 35 to 40%. The left ventricle has  moderately decreased function. The left ventricle demonstrates global  hypokinesis. The left ventricular  internal cavity size was mildly dilated. There is no left ventricular  hypertrophy. Abnormal (paradoxical) septal motion, consistent with left  bundle branch block. Left ventricular diastolic parameters are consistent  with Grade II diastolic dysfunction  (pseudonormalization). Elevated left atrial pressure.   Right Ventricle: The right ventricular size is mildly enlarged. No  increase in right ventricular wall thickness. Right ventricular systolic  function is normal. There is severely elevated pulmonary artery systolic  pressure. The tricuspid regurgitant  velocity is 3.80 m/s, and with an assumed right atrial pressure of 15  mmHg, the estimated right ventricular systolic pressure is 72.8 mmHg.   Left Atrium: Left atrial size was severely dilated.   Right Atrium: Right atrial size was severely dilated.   Pericardium: Trivial pericardial effusion is present.   Mitral Valve: The mitral valve is abnormal. Mild mitral annular  calcification. Moderate to severe mitral valve regurgitation, with  centrally-directed jet.   Tricuspid Valve: The tricuspid valve is normal in structure. Tricuspid  valve regurgitation is moderate.   Aortic Valve: The aortic valve is grossly normal. Aortic valve  regurgitation is trivial. No aortic stenosis is present. Aortic valve mean  gradient measures 4.0 mmHg. Aortic valve peak gradient measures 7.3 mmHg.  Aortic valve area, by VTI measures 1.43  cm.   Pulmonic Valve: The pulmonic valve was not  well visualized.   Aorta: The aortic root was not well visualized.   Venous: The inferior vena cava is dilated in size with less than 50%  respiratory variability, suggesting right atrial pressure of 15 mmHg.   IAS/Shunts: No atrial level shunt detected by color flow Doppler.    LHC 08/10/2023    Prox LAD lesion is 75% stenosed.   Prox LAD to Mid LAD lesion is 70% stenosed.   RPDA lesion is 80% stenosed.   1st Mrg lesion is 50% stenosed.   LV end diastolic pressure is mildly elevated.   Hemodynamic findings consistent with mild pulmonary hypertension.   Moderate 2 vessel obstructive CAD. Segmental disease in the proximal to mid LAD. Diffuse PDA disease Mildly elevated LV filling pressures. LVEDP 20 mm Hg. PCWP 21/28 with  mean 23 mm Hg Mild pulmonary HTN PAP 58/30, mean 34 mm Hg Cardiac output 4.8 L/min, index 2.9   Plan: based on these findings I suspect his symptoms are mostly related to CHF. Recommend optimization of CHF therapy.  Diagnostic Dominance: Right  Intervention  Assessment & Plan   1.  Complete heart block-was seen and evaluated by Dr. Daneil Dunker on 08/25/2023.  His cardiac event monitor was reviewed.  It had showed 2200 episodes of AV block.  He underwent pacemaker implantation on 07/02/2023.  Reports breathing is better at this time. Slowly increase physical activity Feeling well postoperatively Follow-up with EP as scheduled.  Combined systolic and diastolic CHF-weight today 117 lbs. Euvolemic. Daily weights and weight log Heart healthy low-sodium diet-reviewed Elevate lower extremities when not active Continue losartan, hydrochlorothiazide  Coronary artery disease, DOE-denies exertional chest discomfort.  Does have some mild soreness postoperatively.  Cardiac catheterization on 08/10/2023.  He was noted to have proximal LAD 75%, proximal-mid LAD 70 percent, RPDA 80%, first marginal 50% LVEDP was mildly elevated.  During that time it was felt that his symptoms were  mostly related to CHF.  Medical management was recommended with the optimization of CHF therapy. Continue aspirin , HCTZ, losartan  Hyperlipidemia-LDL 98 on 12/29/22. High-fiber diet Increase physical  activity as tolerated Continue aspirin , Lipitor 20 mg daily Repeat fasting lipids and LFTs in 6 weeks     Disposition: Follow-up with  Dr. Chancy Comber or me in 3 months.   Chet Cota. Tyla Burgner NP-C     09/08/2023, 10:00 AM [facility] [address] Office [phone] Fax [phone]    I spent 14 minutes examining this patient, reviewing medications, and using patient centered shared decision making involving their cardiac care.   I spent  20 minutes reviewing past medical history,  medications, and prior cardiac tests.

## 2023-09-08 ENCOUNTER — Encounter: Payer: Self-pay | Admitting: General Practice

## 2023-09-08 ENCOUNTER — Ambulatory Visit: Attending: General Practice | Admitting: General Practice

## 2023-09-08 VITALS — BP 118/60 | HR 69 | Ht 69.0 in | Wt 117.0 lb

## 2023-09-08 DIAGNOSIS — I5022 Chronic systolic (congestive) heart failure: Secondary | ICD-10-CM | POA: Diagnosis not present

## 2023-09-08 DIAGNOSIS — E782 Mixed hyperlipidemia: Secondary | ICD-10-CM | POA: Diagnosis not present

## 2023-09-08 DIAGNOSIS — I2510 Atherosclerotic heart disease of native coronary artery without angina pectoris: Secondary | ICD-10-CM | POA: Diagnosis not present

## 2023-09-08 DIAGNOSIS — Z8679 Personal history of other diseases of the circulatory system: Secondary | ICD-10-CM | POA: Diagnosis not present

## 2023-09-08 NOTE — Patient Instructions (Addendum)
 Medication Instructions:  Your physician recommends that you continue on your current medications as directed. Please refer to the Current Medication list given to you today.  *If you need a refill on your cardiac medications before your next appointment, please call your pharmacy*  Lab Work: Lipid panel and LFT - 6 weeks Fasting lab work.  You may go to any [facility] Location for your lab work:  [facility] - [address] ([facility]) - [address] [phone] [address]  [place] - [address]   [place]  - [address]   [place] - [address] - [address] [employer]  - [address] ([facility]   If you have labs (blood work) drawn today and your tests are completely normal, you will receive your results only by: [employer] (if you have MyChart)  If you have any lab test that is abnormal or we need to change your treatment, we will call you or send a MyChart message to review the results.  Testing/Procedures: None ordered.  Follow-Up: At [facility], you and your health needs are our priority.  As part of our continuing mission to provide you with exceptional heart care, we have created designated Provider Care Teams.  These Care Teams include your primary Cardiologist (physician) and Advanced Practice Providers (APPs -  Physician Assistants and Nurse Practitioners) who all work together to provide you with the care you need, when you need it.  We recommend signing up for the patient portal called MyChart.  Sign up information is provided on this After Visit Summary.  MyChart is used to connect with patients for Virtual Visits (Telemedicine).  Patients are able to view lab/test results, encounter notes, upcoming appointments, etc.  Non-urgent messages can be sent to your provider as well.   To learn more about what you can do  with MyChart, go to [email or web address].    Your next appointment:   3 months  The format for your next appointment:   In Person  Provider:   Lawana Pray, NP

## 2023-09-14 ENCOUNTER — Ambulatory Visit: Attending: Cardiovascular Disease

## 2023-09-14 DIAGNOSIS — I4430 Unspecified atrioventricular block: Secondary | ICD-10-CM | POA: Diagnosis not present

## 2023-09-14 MED ORDER — METOPROLOL SUCCINATE ER 25 MG PO TB24
ORAL_TABLET | ORAL | 1 refills | Status: DC
Start: 2023-09-14 — End: 2023-12-18

## 2023-09-14 NOTE — Patient Instructions (Addendum)
 After Your Pacemaker   Monitor your pacemaker site for redness, swelling, and drainage. Call the device clinic at [phone] if you experience these symptoms or fever/chills.  Your incision was closed with Steri-strips or staples:  You may shower 7 days after your procedure and wash your incision with soap and water. Avoid lotions, ointments, or perfumes over your incision until it is well-healed.  You may use a hot tub or a pool after your wound check appointment if the incision is completely closed.  Do not lift, push or pull greater than 10 pounds with the affected arm until 6 weeks after your procedure. UNTIL AFTER JULY 16TH. There are no other restrictions in arm movement after your wound check appointment.  You may drive, unless driving has been restricted by your healthcare providers.  Remote monitoring is used to monitor your pacemaker from home. This monitoring is scheduled every 91 days by our office. It allows us  to keep an eye on the functioning of your device to ensure it is working properly. You will routinely see your Electrophysiologist annually (more often if necessary).     Medication Change: 1) START Toprol  XL (metoprolol  succinate) 25 mg: - take 1 tablet by mouth once daily AT BEDTIME Hold for blood pressure < 100/50 mmHg  Metoprolol  Extended-Release Tablets What is this medication? METOPROLOL  (me TOE proe lole) treats high blood pressure and heart failure. It may also be used to prevent chest pain (angina). It works by lowering your blood pressure and heart rate, making it easier for your heart to pump blood to the rest of your body. It belongs to a group of medications called beta blockers. This medicine may be used for other purposes; ask your health care provider or pharmacist if you have questions. COMMON BRAND NAME(S): toprol , Toprol  XL, Toprol -XL What should I tell my care team before I take this medication? They need to know if you have any of these  conditions: Diabetes Heart or vessel disease, such as slow heartbeat, worsening heart failure, heart block, sick sinus syndrome, or Raynaud syndrome Kidney disease Liver disease Lung or breathing disease, such as asthma or emphysema Pheochromocytoma Thyroid disease An unusual or allergic reaction to metoprolol , other medications, foods, dyes, or preservatives Pregnant or trying to get pregnant Breastfeeding How should I use this medication? Take this medication by mouth. Take it as directed on the prescription label at the same time every day. Take it with food. You may cut the tablet in half if it is scored (has a line in the middle of it). This may help you swallow the tablet if the whole tablet is too big. Be sure to take both halves. Do not take just one-half of the tablet. Keep taking it unless your care team tells you to stop. Talk to your care team about the use of this medication in children. While it may be prescribed for children as young as 6 years for selected conditions, precautions do apply. Overdosage: If you think you have taken too much of this medicine contact a poison control center or emergency room at once. NOTE: This medicine is only for you. Do not share this medicine with others. What if I miss a dose? If you miss a dose, take it as soon as you can. If it is almost time for your next dose, take only that dose. Do not take double or extra doses. What may interact with this medication? Certain medications for blood pressure, heart disease, irregular heartbeat NSAIDS, medications  for pain and inflammation, such as ibuprofen or naproxen This list may not describe all possible interactions. Give your health care provider a list of all the medicines, herbs, non-prescription drugs, or dietary supplements you use. Also tell them if you smoke, drink alcohol, or use illegal drugs. Some items may interact with your medicine. What should I watch for while using this  medication? Visit your care team for regular checks on your progress. Check your blood pressure as directed. Know what your blood pressure should be and when to contact your care team. This medication may affect your coordination, reaction time, or judgment. Do not drive or operate machinery until you know how this medication affects you. Sit up or stand slowly to reduce the risk of dizzy or fainting spells. Drinking alcohol with this medication can increase the risk of these side effects. Do not suddenly stop taking this medication. This may increase your risk of side effects, such as chest pain and heart attack. If you no longer need to take this medication, your care team will lower the dose slowly over time to decrease the risk of side effects. If you are going to need surgery or a procedure, tell your care team that you are using this medication. This medication may affect blood glucose levels. It can also mask the symptoms of low blood sugar, such as a rapid heartbeat and tremors. If you have diabetes, it is important to check your blood sugar often while you are taking this medication. Do not treat yourself for coughs, colds, or pain while you are using this medication without asking your care team for advice. Some medications may increase your blood pressure. What side effects may I notice from receiving this medication? Side effects that you should report to your care team as soon as possible: Allergic reactions--skin rash, itching, hives, swelling of the face, lips, tongue, or throat Heart failure--shortness of breath, swelling of the ankles, feet, or hands, sudden weight gain, unusual weakness or fatigue Low blood pressure--dizziness, feeling faint or lightheaded, blurry vision Raynaud's--cool, numb, or painful fingers or toes that may change color from pale, to blue, to red Slow heartbeat--dizziness, feeling faint or lightheaded, confusion, trouble breathing, unusual weakness or  fatigue Worsening mood, feelings of depression Side effects that usually do not require medical attention (report to your care team if they continue or are bothersome): Change in sex drive or performance Diarrhea Dizziness Fatigue Headache This list may not describe all possible side effects. Call your doctor for medical advice about side effects. You may report side effects to FDA at 1-800-FDA-1088. Where should I keep my medication? Keep out of the reach of children and pets. Store at room temperature between 20 and 25 degrees C (68 and 77 degrees F). Throw away any unused medication after the expiration date. NOTE: This sheet is a summary. It may not cover all possible information. If you have questions about this medicine, talk to your doctor, pharmacist, or health care provider.  2024 Elsevier/Gold Standard (2022-03-14 00:00:00)   Premature Ventricular Contraction  A premature ventricular contraction (PVC) is a type of irregular heartbeat (arrhythmia). The heart has four chambers, including the upper chambers (atria) and lower chambers (ventricles). Normally, an electrical signal starts in a group of cells called the sinoatrial node (SA node) and travels through the atria, causing them to pump blood into the ventricles. During a PVC, the heartbeat starts in one of the lower ventricles. This may cause the heartbeat to be shorter and less  effective. In most cases, PVCs come and go and do not require treatment. What are the causes? Common causes of the condition include: Heart attack or coronary artery disease (CAD). Heart valve problems. Heart surgery. Infection of the heart (myocarditis). Inflammation of the heart. In many cases, the cause of this condition is not known. What increases the risk? The following factors may make you more likely to develop this condition: Age, especially being over age 70. Being male. An imbalance of salts and minerals in the body (electrolytes). Low  blood oxygen levels or high carbon dioxide levels. Certain medicines, including over-the-counter and prescribed medicines. High blood pressure. Obesity. Episodes may be triggered by: Vigorous exercise. Tobacco, alcohol, or caffeine use. Illegal drug use. Emotional stress. Poor or irregular sleep. What are the signs or symptoms? The main symptoms of this condition are fast or irregular heartbeats (palpitations) or the feeling of a pause in the heartbeat. Other symptoms include: Shortness of breath. Difficulty exercising. Chest pain. Feeling tired. Dizziness. In some cases, there are no symptoms. How is this diagnosed? This condition may be diagnosed based on: Your medical history or symptoms. A physical exam. Your health care provider may listen to your heart. Tests, such as: Blood tests. An ECG (electrocardiogram) to monitor the electrical activity of your heart. An ambulatory cardiac monitor that records your heartbeats for 24 hours or more. Stress tests to see how exercise affects your heart rhythm and blood supply. An echocardiogram, which creates an image of your heart. An electrophysiology study (EPS) to check for electrical problems in your heart. How is this treated? Treatment for this condition depends on any underlying conditions, the type of PVC, how many PVCs you have had, and if the symptoms are affecting your daily life. Possible treatments include: Avoiding things that cause PVCs (triggers). These include caffeine, tobacco, and alcohol. Taking medicines if symptoms are severe or if the arrhythmias happen a lot. Getting treatment for underlying conditions that cause PVCs. Having an implantable cardioverter defibrillator (ICD) placed in the chest to monitor the heartbeat. The monitor sends a shock to the heart if it senses an arrhythmia and brings the heartbeat back to normal. Having a catheter ablation procedure to destroy the part of the heart tissue that sends  abnormal signals. In many cases, no treatment is required. Follow these instructions at home: Lifestyle  Do not use any products that contain nicotine or tobacco. These products include cigarettes, chewing tobacco, and vaping devices, such as e-cigarettes. If you need help quitting, ask your health care provider. Do not use illegal drugs. Exercise regularly. Ask your health care provider what type of exercise is safe for you. Try to get at least 7-9 hours of sleep each night. Find healthy ways to manage stress. Avoid stressful situations when possible. Alcohol use Do not drink alcohol if: Your health care provider tells you not to drink. You are pregnant, may be pregnant, or are planning to become pregnant. Alcohol triggers your episodes. If you drink alcohol: Limit how much you have to: 0-1 drink a day for women. 0-2 drinks a day for men. Know how much alcohol is in your drink. In the [place] one drink equals one 12 oz bottle of beer (355 mL), one 5 oz glass of wine (148 mL), or one 1 oz glass of hard liquor (44 mL). General instructions Take over-the-counter and prescription medicines only as told by your health care provider. If caffeine triggers episodes of PVC, do not eat, drink, or use anything with  caffeine in it. Contact a health care provider if: You feel palpitations often. You have nausea and vomiting. Get help right away if: You have chest pain. You have trouble breathing. You start sweating for no reason. You become light-headed or you faint. These symptoms may be an emergency. Get help right away. Call 911. Do not wait to see if the symptoms will go away. Do not drive yourself to the hospital. This information is not intended to replace advice given to you by your health care provider. Make sure you discuss any questions you have with your health care provider. Document Revised: 08/13/2021 Document Reviewed: 08/13/2021 Elsevier Patient Education  2024 [employer].

## 2023-09-14 NOTE — Progress Notes (Signed)
 Normal dual chamber pacemaker wound check. Presenting rhythm: AS/VP-VS 68-120'S with frequent PVC's. Wound well healed. Routine testing performed. Thresholds, sensing, and impedances consistent with implant measurements and at 3.5V safety margin/auto capture until 3 month visit. Numerous AMS episodes. EGM's consistent with tachyarrhythmias with bigeminal and trigeminal PVCs- PVC burden 13-15%.  Histograms present with elevated heart rates.    Reviewed heart rates and PVC burden with Mertha Abrahams, PA- recommendations to start Metoprolol  Succinate 25 mg Qhs. Wife reports patient's blood pressure runs around 118/60's; however, I do note some lower readings at recent office visits.  Patient instructed to check his blood pressure daily right now and hold Metoprolol  if BP 100/50 or less and call the office.  Will continue to monitor PVC and heart rates with remote transmissions.    Reviewed arm restrictions to continue for 6 weeks total post op.  Pt enrolled in remote follow-up. No programming changes made today.

## 2023-10-13 ENCOUNTER — Ambulatory Visit

## 2023-10-13 DIAGNOSIS — I4430 Unspecified atrioventricular block: Secondary | ICD-10-CM | POA: Diagnosis not present

## 2023-10-16 LAB — CUP PACEART REMOTE DEVICE CHECK
Battery Remaining Longevity: 96 mo
Battery Remaining Percentage: 95.5 %
Battery Voltage: 3.01 V
Brady Statistic AP VP Percent: 24 %
Brady Statistic AP VS Percent: 1 %
Brady Statistic AS VP Percent: 60 %
Brady Statistic AS VS Percent: 3.1 %
Brady Statistic RA Percent Paced: 16 %
Brady Statistic RV Percent Paced: 85 %
Date Time Interrogation Session: 20250718025253
Implantable Lead Connection Status: 753985
Implantable Lead Connection Status: 753985
Implantable Lead Implant Date: 20250604
Implantable Lead Implant Date: 20250604
Implantable Lead Location: 753859
Implantable Lead Location: 753860
Implantable Pulse Generator Implant Date: 20250604
Lead Channel Impedance Value: 390 Ohm
Lead Channel Impedance Value: 390 Ohm
Lead Channel Pacing Threshold Amplitude: 0.75 V
Lead Channel Pacing Threshold Amplitude: 0.875 V
Lead Channel Pacing Threshold Pulse Width: 0.4 ms
Lead Channel Pacing Threshold Pulse Width: 0.4 ms
Lead Channel Sensing Intrinsic Amplitude: 12 mV
Lead Channel Sensing Intrinsic Amplitude: 3.7 mV
Lead Channel Setting Pacing Amplitude: 1.125
Lead Channel Setting Pacing Amplitude: 3.5 V
Lead Channel Setting Pacing Pulse Width: 0.4 ms
Lead Channel Setting Sensing Sensitivity: 2 mV
Pulse Gen Model: 2272
Pulse Gen Serial Number: 8276041

## 2023-10-18 ENCOUNTER — Ambulatory Visit: Payer: Self-pay | Admitting: Cardiology

## 2023-10-24 ENCOUNTER — Other Ambulatory Visit: Payer: Self-pay | Admitting: *Deleted

## 2023-10-24 DIAGNOSIS — E782 Mixed hyperlipidemia: Secondary | ICD-10-CM

## 2023-10-24 LAB — LIPID PANEL

## 2023-10-25 ENCOUNTER — Ambulatory Visit: Payer: Self-pay | Admitting: General Practice

## 2023-10-25 LAB — LIPID PANEL
Cholesterol, Total: 104 mg/dL (ref 100–199)
HDL: 61 mg/dL (ref >39)
LDL CALC COMMENT:: 1.7 ratio (ref 0.0–5.0)
LDL Chol Calc (NIH): 26 mg/dL (ref 0–99)
Triglycerides: 88 mg/dL (ref 0–149)
VLDL Cholesterol Cal: 17 mg/dL (ref 5–40)

## 2023-10-25 LAB — HEPATIC FUNCTION PANEL
ALT: 19 IU/L (ref 0–44)
AST: 23 IU/L (ref 0–40)
Albumin: 4.5 g/dL (ref 3.6–4.6)
Alkaline Phosphatase: 62 IU/L (ref 44–121)
Bilirubin Total: 1.5 mg/dL — ABNORMAL HIGH (ref 0.0–1.2)
Bilirubin, Direct: 0.53 mg/dL — AB (ref 0.00–0.40)
Total Protein: 7 g/dL (ref 6.0–8.5)

## 2023-10-27 ENCOUNTER — Telehealth: Payer: Self-pay | Admitting: General Practice

## 2023-10-27 NOTE — Telephone Encounter (Signed)
 His lipid panel shows well-controlled cholesterol. His liver panel showed normal AST and ALT. We will continue with his current medication regimen   The patient has been notified of the result and verbalized understanding.  All questions (if any) were answered. Roxie LITTIE Louder, RN 10/27/2023 1:39 PM

## 2023-10-27 NOTE — Telephone Encounter (Signed)
 Pt's wife returning call regarding lab results. Please advise.

## 2023-10-30 ENCOUNTER — Telehealth: Payer: Self-pay

## 2023-10-30 NOTE — Telephone Encounter (Signed)
 Alert received from [employer] for 211 AMS classified episodes, longest on 10/28/23 at 11:32 of ongoing duration, V rates are controlled, Burden 3.7% since 09/14/23, not on OAC per Epic, AF appears new for pt, routed to alert group for review.  Attempted to contact patient. No answer, left message to call back.

## 2023-10-31 ENCOUNTER — Other Ambulatory Visit: Payer: Self-pay | Admitting: General Practice

## 2023-10-31 NOTE — Telephone Encounter (Signed)
 Attempted to contact patient. No answer and patients wife phone number is listed as the same.

## 2023-10-31 NOTE — Telephone Encounter (Signed)
 Patient called back to clinic   This RN discussed with him and his wife that new Afib was found on his heart device  Afib was briefly explained to the couple along with the associated risk for stroke  Patient asked if he would be interested in seeing a provider at the AF clinic to discuss Afib further along with treatment options  Patient agreeable to this  Patient scheduled for 11/02/23 at 11am by AF scheduling team  Patient's spouse verbalized correct location and time of appointment to this RN  All questions and concerns addressed at this time  Patient appreciative of the phone call

## 2023-11-02 ENCOUNTER — Ambulatory Visit (HOSPITAL_COMMUNITY)
Admission: RE | Admit: 2023-11-02 | Discharge: 2023-11-02 | Disposition: A | Discharge status: Home / Self Care | Source: Ambulatory Visit | Attending: Internal Medicine | Admitting: Internal Medicine

## 2023-11-02 VITALS — BP 120/60 | HR 70 | Ht 69.0 in | Wt 117.0 lb

## 2023-11-02 DIAGNOSIS — D6869 Other thrombophilia: Secondary | ICD-10-CM

## 2023-11-02 DIAGNOSIS — I4891 Unspecified atrial fibrillation: Secondary | ICD-10-CM

## 2023-11-02 DIAGNOSIS — I480 Paroxysmal atrial fibrillation: Secondary | ICD-10-CM

## 2023-11-02 MED ORDER — APIXABAN 2.5 MG PO TABS: 2.5000 mg | ORAL_TABLET | Freq: Two times a day (BID) | ORAL | 11 refills | Status: DC

## 2023-11-02 NOTE — Patient Instructions (Addendum)
Stop aspirin ? ?Start Eliquis 2.5mg twice a day ?

## 2023-11-02 NOTE — Progress Notes (Addendum)
Primary Care Physician: Nanci Senior, MD Primary Cardiologist: Soyla DELENA Merck, MD Electrophysiologist: None     Referring Physician: Device clinic     Guy Espinoza is a 89 y.o. 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 Device clinic alert on 8/4 for new Afib. Patient has a CHADS2VASC score of 5.  On evaluation today, patient is currently in V paced rhythm. He notes an ongoing history of SOB and fatigue which is not new. When specifically asked, both patient and his wife acknowledge that since device clinic phone call he has not noticed any worsening of symptoms / feels the same as a month ago.    Today, he denies symptoms of palpitations, chest pain, orthopnea, PND, lower extremity edema, dizziness, presyncope, syncope, snoring, daytime somnolence, bleeding, or neurologic sequela. The patient is tolerating medications without difficulties and is otherwise without complaint today.    he has a BMI of Body mass index is 17.28 kg/m.SABRA Filed Weights   11/02/23 1043  Weight: 53.1 kg    Current Outpatient Medications  Medication Sig Dispense Refill   acetaminophen  (TYLENOL ) 650 MG CR tablet Take 1,300 mg by mouth every 8 (eight) hours as needed for pain. (Patient taking differently: Take 1,300 mg by mouth as needed for pain.)     apixaban  (ELIQUIS ) 2.5 MG TABS tablet Take 1 tablet (2.5 mg total) by mouth 2 (two) times daily. 60 tablet 11   atorvastatin  (LIPITOR) 20 MG tablet TAKE 1 TABLET BY MOUTH EVERY DAY 90 tablet 1   Ferrous Sulfate (IRON) 325 (65 Fe) MG TABS Take 1 tablet by mouth daily.     hydrochlorothiazide (HYDRODIURIL) 25 MG tablet Take 25 mg by mouth daily.     losartan (COZAAR) 25 MG tablet Take 25 mg by mouth daily.     metoprolol  succinate (TOPROL  XL) 25 MG 24 hr tablet Take 1 tablet (25 mg) by mouth once daily AT BEDTIME. Hold for blood  pressure < 100/50 90 tablet 1   Multiple Vitamins-Minerals (MULTIVITAMIN ADULT) TABS Take 1 tablet by mouth daily.     Omega-3 Fatty Acids (FISH OIL PO) Take 350 mg by mouth daily.     potassium chloride  (KLOR-CON  M) 10 MEQ tablet Take 1 tablet (10 mEq total) by mouth daily on Sat (5/31) and Mon (6/2) only. Take with lasix . 2 tablet 0   tamsulosin (FLOMAX) 0.4 MG CAPS capsule Take 0.4 mg by mouth daily after supper.     TRELEGY ELLIPTA 100-62.5-25 MCG/ACT AEPB Inhale 1 puff into the lungs daily.     furosemide  (LASIX ) 20 MG tablet Take 1 tablet (20 mg total) by mouth daily on Sat (5/31) and Mon (6/2) only. Take with potassium supplementation. (Patient not taking: Reported on 11/02/2023) 2 tablet 0   saw palmetto 160 MG capsule Take 160 mg by mouth daily. (Patient not taking: Reported on 11/02/2023)     No current facility-administered medications for this encounter.    Atrial Fibrillation Management history:  Previous antiarrhythmic drugs: none Previous cardioversions: none Previous ablations: none Anticoagulation history: none   ROS- All systems are reviewed and negative except as per the HPI above.  Physical Exam: BP 120/60   Pulse 70   Ht 5' 9 (1.753 m)   Wt 53.1 kg   SpO2 97%   BMI 17.28 kg/m   GEN: Well nourished, well developed in no acute  distress NECK: No JVD; No carotid bruits CARDIAC: Regular rate (V paced), no murmurs, rubs, gallops RESPIRATORY:  Clear to auscultation without rales, wheezing or rhonchi  ABDOMEN: Soft, non-tender, non-distended EXTREMITIES:  No edema; No deformity   EKG today demonstrates  Vent. rate 70 BPM PR interval * ms QRS duration 146 ms QT/QTcB 448/483 ms P-R-T axes * -87 93 Ventricular-paced rhythm Abnormal ECG When compared with ECG of 30-Aug-2023 18:34, Premature ventricular complexes are no longer Present Vent. rate has decreased BY 14 BPM  Echo 08/01/23 demonstrated  1. No left ventricular thrombus is seen. Left ventricular ejection   fraction, by estimation, is 35 to 40%. The left ventricle has moderately  decreased function. The left ventricle demonstrates global hypokinesis.  The left ventricular internal cavity size   was mildly dilated. Left ventricular diastolic parameters are consistent  with Grade II diastolic dysfunction (pseudonormalization). Elevated left  atrial pressure.   2. Right ventricular systolic function is normal. The right ventricular  size is mildly enlarged. There is severely elevated pulmonary artery  systolic pressure. The estimated right ventricular systolic pressure is  72.8 mmHg.   3. Left atrial size was severely dilated.   4. Right atrial size was severely dilated.   5. The mitral valve is abnormal. Moderate to severe mitral valve  regurgitation.   6. Tricuspid valve regurgitation is moderate.   7. The aortic valve is grossly normal. Aortic valve regurgitation is  trivial. No aortic stenosis is present.   8. The inferior vena cava is dilated in size with <50% respiratory  variability, suggesting right atrial pressure of 15 mmHg.   ASSESSMENT & PLAN CHA2DS2-VASc Score = 5  The patient's score is based upon: CHF History: 1 HTN History: 1 Diabetes History: 0 Stroke History: 0 Vascular Disease History: 1 Age Score: 2 Gender Score: 0       ASSESSMENT AND PLAN: Paroxysmal Atrial Fibrillation / flutter (ICD10:  I48.0) The patient's CHA2DS2-VASc score is 5, indicating a 7.2% annual risk of stroke.    Patient is currently in V paced rhythm. It appears to be underlying atrial flutter. We discussed the risks of this arrhythmia. We discussed intervention versus rate control strategy given patient's age. We discussed the procedure cardioversion and possible risks of procedure. After discussion with patient and wife, since he has no change in his symptoms and risk of procedure, he chooses rate control strategy which is reasonable. No change to medication regimen at this time.   Patient's  wife notes that his oxygen level drops with walking; advised to follow up with PCP for this as he may need oxygen.   Secondary Hypercoagulable State (ICD10:  D68.69) The patient is at significant risk for stroke/thromboembolism based upon his CHA2DS2-VASc Score of 5.  Start Apixaban  (Eliquis ).  We discussed the reasoning behind anticoagulation in the setting of stroke prevention related to Afib. We discussed the benefits vs risks of anticoagulation. We discussed the benefit of stroke prevention versus risk of catastrophic bleed if patient falls. We did review his fall history with patient and wife. After discussion, patient would like to begin anticoagulation and understands the potential risks. Will begin Eliquis  2.5 mg BID; plan for patient to have CBC in 1 month at next appointment. Discussed previously with primary cardiologist - patient will stop ASA.     Follow up as scheduled for device check. Follow up 1 year Afib clinic.    Terra Pac, PA-C  Afib Clinic [facility] [address] [place], [place]  [address] [phone]  

## 2023-12-01 ENCOUNTER — Emergency Department (HOSPITAL_COMMUNITY)

## 2023-12-01 ENCOUNTER — Other Ambulatory Visit: Payer: Self-pay

## 2023-12-01 ENCOUNTER — Inpatient Hospital Stay (HOSPITAL_COMMUNITY)
Admission: EM | Admit: 2023-12-01 | Discharge: 2023-12-12 | DRG: 291 | Disposition: A | Discharge status: Hospice | Source: Skilled Nursing Facility | Attending: Internal Medicine | Admitting: Internal Medicine

## 2023-12-01 ENCOUNTER — Encounter: Payer: Self-pay | Admitting: Student

## 2023-12-01 ENCOUNTER — Encounter (HOSPITAL_COMMUNITY): Payer: Self-pay

## 2023-12-01 ENCOUNTER — Ambulatory Visit: Attending: Student | Admitting: Student

## 2023-12-01 VITALS — BP 114/62 | HR 71 | Ht 69.0 in | Wt 117.0 lb

## 2023-12-01 DIAGNOSIS — E7800 Pure hypercholesterolemia, unspecified: Secondary | ICD-10-CM | POA: Diagnosis present

## 2023-12-01 DIAGNOSIS — R911 Solitary pulmonary nodule: Secondary | ICD-10-CM | POA: Diagnosis present

## 2023-12-01 DIAGNOSIS — I5023 Acute on chronic systolic (congestive) heart failure: Secondary | ICD-10-CM | POA: Diagnosis present

## 2023-12-01 DIAGNOSIS — Z950 Presence of cardiac pacemaker: Secondary | ICD-10-CM

## 2023-12-01 DIAGNOSIS — I081 Rheumatic disorders of both mitral and tricuspid valves: Secondary | ICD-10-CM | POA: Diagnosis present

## 2023-12-01 DIAGNOSIS — I272 Pulmonary hypertension, unspecified: Secondary | ICD-10-CM | POA: Diagnosis present

## 2023-12-01 DIAGNOSIS — Z8041 Family history of malignant neoplasm of ovary: Secondary | ICD-10-CM

## 2023-12-01 DIAGNOSIS — J690 Pneumonitis due to inhalation of food and vomit: Secondary | ICD-10-CM | POA: Diagnosis not present

## 2023-12-01 DIAGNOSIS — I5043 Acute on chronic combined systolic (congestive) and diastolic (congestive) heart failure: Secondary | ICD-10-CM

## 2023-12-01 DIAGNOSIS — I480 Paroxysmal atrial fibrillation: Secondary | ICD-10-CM

## 2023-12-01 DIAGNOSIS — J441 Chronic obstructive pulmonary disease with (acute) exacerbation: Secondary | ICD-10-CM | POA: Diagnosis present

## 2023-12-01 DIAGNOSIS — Z8679 Personal history of other diseases of the circulatory system: Secondary | ICD-10-CM | POA: Diagnosis not present

## 2023-12-01 DIAGNOSIS — D6869 Other thrombophilia: Secondary | ICD-10-CM | POA: Diagnosis not present

## 2023-12-01 DIAGNOSIS — Z23 Encounter for immunization: Secondary | ICD-10-CM

## 2023-12-01 DIAGNOSIS — I2510 Atherosclerotic heart disease of native coronary artery without angina pectoris: Secondary | ICD-10-CM | POA: Diagnosis present

## 2023-12-01 DIAGNOSIS — N390 Urinary tract infection, site not specified: Secondary | ICD-10-CM | POA: Diagnosis not present

## 2023-12-01 DIAGNOSIS — R54 Age-related physical debility: Secondary | ICD-10-CM | POA: Diagnosis present

## 2023-12-01 DIAGNOSIS — J432 Centrilobular emphysema: Secondary | ICD-10-CM | POA: Diagnosis present

## 2023-12-01 DIAGNOSIS — I10 Essential (primary) hypertension: Secondary | ICD-10-CM | POA: Diagnosis present

## 2023-12-01 DIAGNOSIS — I2781 Cor pulmonale (chronic): Secondary | ICD-10-CM | POA: Diagnosis present

## 2023-12-01 DIAGNOSIS — Z79899 Other long term (current) drug therapy: Secondary | ICD-10-CM

## 2023-12-01 DIAGNOSIS — I110 Hypertensive heart disease with heart failure: Principal | ICD-10-CM | POA: Diagnosis present

## 2023-12-01 DIAGNOSIS — R0602 Shortness of breath: Secondary | ICD-10-CM | POA: Diagnosis not present

## 2023-12-01 DIAGNOSIS — Z66 Do not resuscitate: Secondary | ICD-10-CM | POA: Diagnosis not present

## 2023-12-01 DIAGNOSIS — Z7901 Long term (current) use of anticoagulants: Secondary | ICD-10-CM

## 2023-12-01 DIAGNOSIS — Z515 Encounter for palliative care: Secondary | ICD-10-CM

## 2023-12-01 DIAGNOSIS — B952 Enterococcus as the cause of diseases classified elsewhere: Secondary | ICD-10-CM | POA: Diagnosis not present

## 2023-12-01 DIAGNOSIS — I447 Left bundle-branch block, unspecified: Secondary | ICD-10-CM | POA: Diagnosis present

## 2023-12-01 DIAGNOSIS — I509 Heart failure, unspecified: Principal | ICD-10-CM

## 2023-12-01 DIAGNOSIS — M4800 Spinal stenosis, site unspecified: Secondary | ICD-10-CM | POA: Diagnosis present

## 2023-12-01 DIAGNOSIS — Z9861 Coronary angioplasty status: Secondary | ICD-10-CM

## 2023-12-01 DIAGNOSIS — I4892 Unspecified atrial flutter: Secondary | ICD-10-CM | POA: Diagnosis present

## 2023-12-01 DIAGNOSIS — R0600 Dyspnea, unspecified: Secondary | ICD-10-CM

## 2023-12-01 DIAGNOSIS — Z7951 Long term (current) use of inhaled steroids: Secondary | ICD-10-CM

## 2023-12-01 DIAGNOSIS — R1310 Dysphagia, unspecified: Secondary | ICD-10-CM | POA: Diagnosis not present

## 2023-12-01 DIAGNOSIS — I482 Chronic atrial fibrillation, unspecified: Secondary | ICD-10-CM | POA: Diagnosis present

## 2023-12-01 DIAGNOSIS — E870 Hyperosmolality and hypernatremia: Secondary | ICD-10-CM | POA: Diagnosis present

## 2023-12-01 DIAGNOSIS — Z825 Family history of asthma and other chronic lower respiratory diseases: Secondary | ICD-10-CM

## 2023-12-01 DIAGNOSIS — Z681 Body mass index (BMI) 19 or less, adult: Secondary | ICD-10-CM

## 2023-12-01 DIAGNOSIS — E43 Unspecified severe protein-calorie malnutrition: Secondary | ICD-10-CM | POA: Diagnosis present

## 2023-12-01 DIAGNOSIS — J9601 Acute respiratory failure with hypoxia: Secondary | ICD-10-CM | POA: Diagnosis present

## 2023-12-01 LAB — CBC
HCT: 38.1 % — ABNORMAL LOW (ref 39.0–52.0)
Hemoglobin: 12.1 g/dL — ABNORMAL LOW (ref 13.0–17.0)
MCH: 31.8 pg (ref 26.0–34.0)
MCHC: 31.8 g/dL (ref 30.0–36.0)
MCV: 100 fL (ref 80.0–100.0)
Platelets: 194 K/uL (ref 150–400)
RBC: 3.81 MIL/uL — ABNORMAL LOW (ref 4.22–5.81)
RDW: 14.4 % (ref 11.5–15.5)
WBC: 9.8 K/uL (ref 4.0–10.5)
nRBC: 0 % (ref 0.0–0.2)

## 2023-12-01 LAB — CUP PACEART INCLINIC DEVICE CHECK
Battery Remaining Longevity: 114 mo
Battery Voltage: 2.99 V
Brady Statistic RA Percent Paced: 9.8 %
Brady Statistic RV Percent Paced: 86 %
Date Time Interrogation Session: 20250905125626
Implantable Lead Connection Status: 753985
Implantable Lead Connection Status: 753985
Implantable Lead Implant Date: 20250604
Implantable Lead Implant Date: 20250604
Implantable Lead Location: 753859
Implantable Lead Location: 753860
Implantable Pulse Generator Implant Date: 20250604
Lead Channel Impedance Value: 387.5 Ohm
Lead Channel Impedance Value: 425 Ohm
Lead Channel Pacing Threshold Amplitude: 0.875 V
Lead Channel Pacing Threshold Pulse Width: 0.4 ms
Lead Channel Sensing Intrinsic Amplitude: 2.1 mV
Lead Channel Sensing Intrinsic Amplitude: 7.9 mV
Lead Channel Setting Pacing Amplitude: 1.125
Lead Channel Setting Pacing Amplitude: 3.5 V
Lead Channel Setting Pacing Pulse Width: 0.4 ms
Lead Channel Setting Sensing Sensitivity: 2 mV
Pulse Gen Model: 2272
Pulse Gen Serial Number: 8276041

## 2023-12-01 LAB — I-STAT VENOUS BLOOD GAS, ED
Acid-Base Excess: 5 mmol/L — ABNORMAL HIGH (ref 0.0–2.0)
Bicarbonate: 28.7 mmol/L — ABNORMAL HIGH (ref 20.0–28.0)
Calcium, Ion: 1.16 mmol/L (ref 1.15–1.40)
HCT: 36 % — ABNORMAL LOW (ref 39.0–52.0)
Hemoglobin: 12.2 g/dL — ABNORMAL LOW (ref 13.0–17.0)
O2 Saturation: 75 %
Potassium: 3.7 mmol/L (ref 3.5–5.1)
Sodium: 148 mmol/L — ABNORMAL HIGH (ref 135–145)
TCO2: 30 mmol/L (ref 22–32)
pCO2, Ven: 38.3 mmHg — ABNORMAL LOW (ref 44–60)
pH, Ven: 7.484 — ABNORMAL HIGH (ref 7.25–7.43)
pO2, Ven: 37 mmHg (ref 32–45)

## 2023-12-01 LAB — TROPONIN I (HIGH SENSITIVITY)
Troponin I (High Sensitivity): 34 ng/L — ABNORMAL HIGH (ref <18)
Troponin I (High Sensitivity): 33 ng/L — ABNORMAL HIGH (ref <18)

## 2023-12-01 LAB — I-STAT CHEM 8, ED
BUN: 30 mg/dL — ABNORMAL HIGH (ref 8–23)
Calcium, Ion: 1.17 mmol/L (ref 1.15–1.40)
Chloride: 110 mmol/L (ref 98–111)
Creatinine, Ser: 1 mg/dL (ref 0.61–1.24)
Glucose, Bld: 124 mg/dL — ABNORMAL HIGH (ref 70–99)
HCT: 37 % — ABNORMAL LOW (ref 39.0–52.0)
Hemoglobin: 12.6 g/dL — ABNORMAL LOW (ref 13.0–17.0)
Potassium: 3.7 mmol/L (ref 3.5–5.1)
Sodium: 147 mmol/L — ABNORMAL HIGH (ref 135–145)
TCO2: 27 mmol/L (ref 22–32)

## 2023-12-01 LAB — BASIC METABOLIC PANEL WITH GFR
Anion gap: 13 (ref 5–15)
BUN: 30 mg/dL — ABNORMAL HIGH (ref 8–23)
CO2: 27 mmol/L (ref 22–32)
Calcium: 9.2 mg/dL (ref 8.9–10.3)
Chloride: 107 mmol/L (ref 98–111)
Creatinine, Ser: 0.94 mg/dL (ref 0.61–1.24)
GFR, Estimated: >60 mL/min (ref >60)
Glucose, Bld: 125 mg/dL — ABNORMAL HIGH (ref 70–99)
Potassium: 3.8 mmol/L (ref 3.5–5.1)
Sodium: 147 mmol/L — ABNORMAL HIGH (ref 135–145)

## 2023-12-01 LAB — BRAIN NATRIURETIC PEPTIDE: B Natriuretic Peptide: 706 pg/mL — ABNORMAL HIGH (ref 0.0–100.0)

## 2023-12-01 MED ORDER — FUROSEMIDE 10 MG/ML IJ SOLN
20.0000 mg | Freq: Once | INTRAMUSCULAR | Status: AC
  Administered 2023-12-01: 20 mg via INTRAVENOUS
  Filled 2023-12-01: qty 2

## 2023-12-01 MED ORDER — APIXABAN 2.5 MG PO TABS
2.5000 mg | ORAL_TABLET | Freq: Two times a day (BID) | ORAL | Status: DC
  Administered 2023-12-01: 2.5 mg via ORAL
  Filled 2023-12-01: qty 1

## 2023-12-01 MED ORDER — IPRATROPIUM-ALBUTEROL 0.5-2.5 (3) MG/3ML IN SOLN
3.0000 mL | Freq: Once | RESPIRATORY_TRACT | Status: AC
  Administered 2023-12-01: 3 mL via RESPIRATORY_TRACT
  Filled 2023-12-01: qty 3

## 2023-12-01 MED ORDER — METHYLPREDNISOLONE SODIUM SUCC 125 MG IJ SOLR
125.0000 mg | Freq: Once | INTRAMUSCULAR | Status: AC
Start: 2023-12-01 — End: 2023-12-01
  Administered 2023-12-01: 125 mg via INTRAVENOUS
  Filled 2023-12-01: qty 2

## 2023-12-01 NOTE — H&P (Signed)
 History and Physical    Patient: Guy Espinoza FMW:[id number] DOB: [birth year] DOA: 12/01/2023 DOS: the patient was seen and examined on 12/01/2023 PCP: Nanci Senior, MD  Patient coming from: Home  Chief Complaint:  Chief Complaint  Patient presents with   Shortness of Breath   HPI: Guy Espinoza is a 89 y.o. male with medical history significant for atrial fibrillation on Eliquis , hypertension, pacemaker, CAD, pulmonary hypertension, and severe coronary artery disease with multiple blockages per cath 07/2023.  He is currently under medical management. The patient had a routine electrophysiology appointment today.  He is being followed for new onset atrial fibrillation.  At clinic he and his wife reported that the patient has had increased shortness of breath particular with exertion over the last 1 to 2 weeks.  He was quite tachypneic and his room air sats were marginal in the low 90s on room air.  He was sent to the emergency to department directly from EP clinic.  In the emergency department the patient was tachypneic on arrival.  He was maintaining his O2 sats in the low 90s on room air but was placed on 3L [place] to get his sats up above 95%.  His workup included a BNP that was found to be 706. His troponin was 34 and down trending.  The patient had a CT of the chest without contrast which revealed small bilateral pleural effusions and a stable right upper lobe pulmonary nodule.  The patient has a known ejection fraction of 35% but he does not require Lasix  daily.  The patient was in some mild respiratory distress upon arrival in the emergency department.  He eventually was given small amount of Lasix  with good results.  His Shortness of breath and oxygen saturations improved.  He will be admitted to the hospitalist service for further management.           Review of Systems: As mentioned in the history of present illness. All other systems reviewed and are negative. Past Medical History:   Diagnosis Date   Asthma    Hypertension    Past Surgical History:  Procedure Laterality Date   CATARACT EXTRACTION Left 2007   double hernia repair  2010   HERNIA REPAIR  1984   KNEE SURGERY Left 1992   PACEMAKER IMPLANT N/A 08/30/2023   Procedure: PACEMAKER IMPLANT;  Surgeon: Kennyth Chew, MD;  Location: [facility] INVASIVE CV LAB;  Service: Cardiovascular;  Laterality: N/A;   RIGHT/LEFT HEART CATH AND CORONARY ANGIOGRAPHY N/A 08/10/2023   Procedure: RIGHT/LEFT HEART CATH AND CORONARY ANGIOGRAPHY;  Surgeon: [place], Peter M, MD;  Location: [facility] INVASIVE CV LAB;  Service: Cardiovascular;  Laterality: N/A;   ROTATOR CUFF REPAIR Left 2001   ruptured blood vessel L eye Left 2008   Social History:  reports that he has never smoked. He has never used smokeless tobacco. He reports that he does not drink alcohol and does not use drugs.  No Known Allergies  Family History  Problem Relation Age of Onset   Bronchitis Mother    Cancer Mother        ovarian    Prior to Admission medications   Medication Sig Start Date End Date Taking? Authorizing Provider  acetaminophen  (TYLENOL ) 650 MG CR tablet Take 1,300 mg by mouth every 8 (eight) hours as needed for pain. Patient taking differently: Take 1,300 mg by mouth as needed for pain.   Yes [provider]  apixaban  (ELIQUIS ) 2.5 MG TABS tablet Take 1 tablet (2.5  mg total) by mouth 2 (two) times daily. 11/02/23  Yes Terra Fairy PARAS, PA-C  atorvastatin  (LIPITOR) 20 MG tablet TAKE 1 TABLET BY MOUTH EVERY DAY 10/31/23  Yes Cleaver, Josefa HERO, NP  Ferrous Sulfate (IRON) 325 (65 Fe) MG TABS Take 1 tablet by mouth daily.   Yes [provider]  hydrochlorothiazide (HYDRODIURIL) 25 MG tablet Take 25 mg by mouth daily.   Yes [provider]  losartan  (COZAAR ) 25 MG tablet Take 25 mg by mouth daily. 06/21/23  Yes [provider]  metoprolol  succinate (TOPROL  XL) 25 MG 24 hr tablet Take 1 tablet (25 mg) by mouth once daily AT BEDTIME.  Hold for blood pressure < 100/50 09/14/23  Yes Leverne Charlies Helling, PA-C  Multiple Vitamins-Minerals (MULTIVITAMIN ADULT) TABS Take 1 tablet by mouth daily.   Yes [provider]  Omega-3 Fatty Acids (FISH OIL PO) Take 350 mg by mouth daily.   Yes [provider]  tamsulosin  (FLOMAX ) 0.4 MG CAPS capsule Take 0.4 mg by mouth daily after supper.   Yes [provider]  TRELEGY ELLIPTA 100-62.5-25 MCG/ACT AEPB Inhale 1 puff into the lungs daily. 06/21/23  Yes [provider]    Physical Exam: Vitals:   12/01/23 1843 12/01/23 2100 12/01/23 2215 12/01/23 2226  BP:  130/85 132/84   Pulse:  61 69 67  Resp:  (!) 26 (!) 21 (!) 23  Temp: (!) 97.5 F (36.4 C)     TempSrc: Oral     SpO2:  100% 99% 100%  Weight:      Height:       Physical Exam:  General: No acute distress, frail, send severe protein calorie malnutrition, pale  HEENT: Normocephalic, atraumatic, PERRL Cardiovascular: Normal rate and rhythm. Murmur. Distal pulses faint Pulmonary: Normal pulmonary effort, decreased breath sounds in the left base Gastrointestinal: Nondistended abdomen, soft, non-tender, hypoactive bowel sounds Musculoskeletal:Normal ROM, no lower ext edema Lymphadenopathy: No cervical LAD. Skin: Skin is warm and dry. Neuro: No focal deficits noted, AAOx3. PSYCH: Attentive and cooperative  Data Reviewed:  Results for orders placed or performed during the hospital encounter of 12/01/23 (from the past 24 hours)  Basic metabolic panel     Status: Abnormal   Collection Time: 12/01/23  2:45 PM  Result Value Ref Range   Sodium 147 (H) 135 - 145 mmol/L   Potassium 3.8 3.5 - 5.1 mmol/L   Chloride 107 98 - 111 mmol/L   CO2 27 22 - 32 mmol/L   Glucose, Bld 125 (H) 70 - 99 mg/dL   BUN 30 (H) 8 - 23 mg/dL   Creatinine, Ser 9.05 0.61 - 1.24 mg/dL   Calcium  9.2 8.9 - 10.3 mg/dL   GFR, Estimated >39 >39 mL/min   Anion gap 13 5 - 15  CBC     Status: Abnormal   Collection Time: 12/01/23   2:45 PM  Result Value Ref Range   WBC 9.8 4.0 - 10.5 K/uL   RBC 3.81 (L) 4.22 - 5.81 MIL/uL   Hemoglobin 12.1 (L) 13.0 - 17.0 g/dL   HCT 61.8 (L) 60.9 - 47.9 %   MCV 100.0 80.0 - 100.0 fL   MCH 31.8 26.0 - 34.0 pg   MCHC 31.8 30.0 - 36.0 g/dL   RDW 85.5 88.4 - 84.4 %   Platelets 194 150 - 400 K/uL   nRBC 0.0 0.0 - 0.2 %  Brain natriuretic peptide     Status: Abnormal   Collection Time: 12/01/23  2:45  PM  Result Value Ref Range   B Natriuretic Peptide 706.0 (H) 0.0 - 100.0 pg/mL  Troponin I (High Sensitivity)     Status: Abnormal   Collection Time: 12/01/23  2:45 PM  Result Value Ref Range   Troponin I (High Sensitivity) 34 (H) <18 ng/L  I-Stat venous blood gas, ([facility] ED, [facility], [facility])     Status: Abnormal   Collection Time: 12/01/23  2:52 PM  Result Value Ref Range   pH, Ven 7.484 (H) 7.25 - 7.43   pCO2, Ven 38.3 (L) 44 - 60 mmHg   pO2, Ven 37 32 - 45 mmHg   Bicarbonate 28.7 (H) 20.0 - 28.0 mmol/L   TCO2 30 22 - 32 mmol/L   O2 Saturation 75 %   Acid-Base Excess 5.0 (H) 0.0 - 2.0 mmol/L   Sodium 148 (H) 135 - 145 mmol/L   Potassium 3.7 3.5 - 5.1 mmol/L   Calcium , Ion 1.16 1.15 - 1.40 mmol/L   HCT 36.0 (L) 39.0 - 52.0 %   Hemoglobin 12.2 (L) 13.0 - 17.0 g/dL   Sample type VENOUS    Comment NOTIFIED PHYSICIAN   I-stat chem 8, ED (not at [facility], [facility] or [facility])     Status: Abnormal   Collection Time: 12/01/23  2:52 PM  Result Value Ref Range   Sodium 147 (H) 135 - 145 mmol/L   Potassium 3.7 3.5 - 5.1 mmol/L   Chloride 110 98 - 111 mmol/L   BUN 30 (H) 8 - 23 mg/dL   Creatinine, Ser 8.99 0.61 - 1.24 mg/dL   Glucose, Bld 875 (H) 70 - 99 mg/dL   Calcium , Ion 1.17 1.15 - 1.40 mmol/L   TCO2 27 22 - 32 mmol/L   Hemoglobin 12.6 (L) 13.0 - 17.0 g/dL   HCT 62.9 (L) 60.9 - 47.9 %  Troponin I (High Sensitivity)     Status: Abnormal   Collection Time: 12/01/23  3:21 PM  Result Value Ref Range   Troponin I (High Sensitivity) 33 (H) <18 ng/L     Assessment and Plan: Acute CHF exacerbation  - gentle diuresis - Continue losartan  and Beta Blocker  2. CAD -no chest pain and troponins are not significantly elevated  3. Htn - Resume outpatient meds  4. Atrial fibrillation - Continue Eliquis     Advance Care Planning:   Code Status: Prior the patient names his wife is his surrogate decision maker and he wants to be full code.  Consults: None  Family Communication: none  Severity of Illness: The appropriate patient status for this patient is INPATIENT. Inpatient status is judged to be reasonable and necessary in order to provide the required intensity of service to ensure the patient's safety. The patient's presenting symptoms, physical exam findings, and initial radiographic and laboratory data in the context of their chronic comorbidities is felt to place them at high risk for further clinical deterioration. Furthermore, it is not anticipated that the patient will be medically stable for discharge from the hospital within 2 midnights of admission.   * I certify that at the point of admission it is my clinical judgment that the patient will require inpatient hospital care spanning beyond 2 midnights from the point of admission due to high intensity of service, high risk for further deterioration and high frequency of surveillance required.*  Author: ARTHEA CHILD, MD 12/01/2023 11:46 PM  For on call review [email or web address].

## 2023-12-01 NOTE — ED Notes (Addendum)
 Guy Espinoza, Physician Assistant was notified the last time patient voided was at 11 am and he had 255 in bladder at 1615. No orders were given

## 2023-12-01 NOTE — ED Triage Notes (Signed)
 Patient sent over from A-fib clinic c/o sob has a pacemaker , denies chest pain

## 2023-12-01 NOTE — ED Notes (Signed)
 Patient ask to urinate and wife told RN patient had a small amount. Patient wife reported the last time he voided was at 11 am today. Bladder scan was done he had 255 cc on his bladder

## 2023-12-01 NOTE — ED Provider Notes (Signed)
 [place] EMERGENCY DEPARTMENT AT [facility] Provider Note   CSN: [id number] Arrival date & time: 12/01/23  1256     Patient presents with: Shortness of Breath   Guy Espinoza is a 89 y.o. male who presents to the emergency department from cardiology office due to shortness of breath.  Patient initially tachypneic on exam and unable to give good history, so history given by wife at bedside.  Wife states that patient has been short of breath for about a month but it has gotten worse over the last week.  Denies chest pain, nausea, vomiting, abdominal pain, fever, chills.  Patient currently alert and oriented x 4.  Wife states that her husband has had a productive cough, coughing up phlegm like substance.  Past medical history significant for hypertension, asthma, pacemaker, atrial fibrillation, coronary artery disease, heart failure with reduced ejection fraction, AV block, etc. Wife states that the patient has been compliant with his Eliquis .  Wife states that patient takes Trelegy at home for asthma.     Shortness of Breath     Prior to Admission medications   Medication Sig Start Date End Date Taking? Authorizing Provider  acetaminophen  (TYLENOL ) 650 MG CR tablet Take 1,300 mg by mouth every 8 (eight) hours as needed for pain. Patient taking differently: Take 1,300 mg by mouth as needed for pain.   Yes [provider]  apixaban  (ELIQUIS ) 2.5 MG TABS tablet Take 1 tablet (2.5 mg total) by mouth 2 (two) times daily. 11/02/23  Yes Terra Fairy PARAS, PA-C  atorvastatin  (LIPITOR) 20 MG tablet TAKE 1 TABLET BY MOUTH EVERY DAY 10/31/23  Yes Cleaver, Josefa HERO, NP  Ferrous Sulfate (IRON) 325 (65 Fe) MG TABS Take 1 tablet by mouth daily.   Yes [provider]  hydrochlorothiazide (HYDRODIURIL) 25 MG tablet Take 25 mg by mouth daily.   Yes [provider]  losartan  (COZAAR ) 25 MG tablet Take 25 mg by mouth daily. 06/21/23  Yes [provider]  metoprolol   succinate (TOPROL  XL) 25 MG 24 hr tablet Take 1 tablet (25 mg) by mouth once daily AT BEDTIME. Hold for blood pressure < 100/50 09/14/23  Yes Leverne Charlies Helling, PA-C  Multiple Vitamins-Minerals (MULTIVITAMIN ADULT) TABS Take 1 tablet by mouth daily.   Yes [provider]  Omega-3 Fatty Acids (FISH OIL PO) Take 350 mg by mouth daily.   Yes [provider]  tamsulosin  (FLOMAX ) 0.4 MG CAPS capsule Take 0.4 mg by mouth daily after supper.   Yes [provider]  TRELEGY ELLIPTA 100-62.5-25 MCG/ACT AEPB Inhale 1 puff into the lungs daily. 06/21/23  Yes [provider]    Allergies: Patient has no known allergies.    Review of Systems  Respiratory:  Positive for shortness of breath.     Updated Vital Signs BP (!) 142/83   Pulse 69   Temp (!) 97.5 F (36.4 C) (Oral)   Resp (!) 28   Ht 5' 9 (1.753 m)   Wt 53.1 kg   SpO2 98%   BMI 17.28 kg/m   Physical Exam Vitals and nursing note reviewed.  Constitutional:      General: He is awake.     Appearance: He is ill-appearing. He is not toxic-appearing or diaphoretic.     Comments: Visibly tachypneic  HENT:     Head: Normocephalic and atraumatic.  Eyes:     Extraocular Movements: Extraocular movements intact.  Cardiovascular:     Rate and Rhythm: Normal rate  and regular rhythm.  Pulmonary:     Effort: Tachypnea and accessory muscle usage present. No respiratory distress.     Breath sounds: No stridor. Examination of the right-upper field reveals wheezing. Examination of the right-middle field reveals wheezing. Examination of the left-middle field reveals wheezing. Examination of the right-lower field reveals wheezing. Examination of the left-lower field reveals wheezing. Wheezing present. No rhonchi or rales.  Musculoskeletal:     Right lower leg: No edema.     Left lower leg: No edema.  Skin:    General: Skin is warm.     Capillary Refill: Capillary refill takes less than 2 seconds.  Neurological:      General: No focal deficit present.     Mental Status: He is alert and oriented to person, place, and time.  Psychiatric:        Mood and Affect: Mood normal.        Behavior: Behavior normal. Behavior is cooperative.     (all labs ordered are listed, but only abnormal results are displayed) Labs Reviewed  BASIC METABOLIC PANEL WITH GFR - Abnormal; Notable for the following components:      Result Value   Sodium 147 (*)    Glucose, Bld 125 (*)    BUN 30 (*)    All other components within normal limits  CBC - Abnormal; Notable for the following components:   RBC 3.81 (*)    Hemoglobin 12.1 (*)    HCT 38.1 (*)    All other components within normal limits  BRAIN NATRIURETIC PEPTIDE - Abnormal; Notable for the following components:   B Natriuretic Peptide 706.0 (*)    All other components within normal limits  I-STAT VENOUS BLOOD GAS, ED - Abnormal; Notable for the following components:   pH, Ven 7.484 (*)    pCO2, Ven 38.3 (*)    Bicarbonate 28.7 (*)    Acid-Base Excess 5.0 (*)    Sodium 148 (*)    HCT 36.0 (*)    Hemoglobin 12.2 (*)    All other components within normal limits  I-STAT CHEM 8, ED - Abnormal; Notable for the following components:   Sodium 147 (*)    BUN 30 (*)    Glucose, Bld 124 (*)    Hemoglobin 12.6 (*)    HCT 37.0 (*)    All other components within normal limits  TROPONIN I (HIGH SENSITIVITY) - Abnormal; Notable for the following components:   Troponin I (High Sensitivity) 34 (*)    All other components within normal limits  TROPONIN I (HIGH SENSITIVITY) - Abnormal; Notable for the following components:   Troponin I (High Sensitivity) 33 (*)    All other components within normal limits    EKG: EKG Interpretation Date/Time:  Friday December 01 2023 13:03:03 EDT Ventricular Rate:  70 PR Interval:    QRS Duration:  148 QT Interval:  450 QTC Calculation: 486 R Axis:   -15  Text Interpretation: Ventricular-paced rhythm Abnormal ECG When compared  with ECG of 01-Dec-2023 12:14, PREVIOUS ECG IS PRESENT Confirmed by Elnor Savant ([id number]) on 12/01/2023 4:28:32 PM  Radiology: CT Chest Wo Contrast Result Date: 12/01/2023 CLINICAL DATA:  Short of breath EXAM: CT CHEST WITHOUT CONTRAST TECHNIQUE: Multidetector CT imaging of the chest was performed following the standard protocol without IV contrast. RADIATION DOSE REDUCTION: This exam was performed according to the departmental dose-optimization program which includes automated exposure control, adjustment of the mA and/or kV according to patient size and/or use of  iterative reconstruction technique. COMPARISON:  12/01/2023, 07/31/2023, 06/03/2003 FINDINGS: Cardiovascular: Unenhanced imaging of the heart demonstrates cardiomegaly without pericardial effusion. Dual lead cardiac pacer is identified, proximal lead within the right atrium and distal lead within the right ventricle. Normal caliber of the thoracic aorta. Atherosclerosis of the aorta and coronary vasculature. Assessment of the vascular lumen cannot be performed without intravenous contrast. Mediastinum/Nodes: No significant abnormalities of the thyroid, trachea, or esophagus. No pathologic adenopathy. Lungs/Pleura: There are small free-flowing bilateral pleural effusions, right greater than left. Dependent areas of consolidation within the lower lobes most consistent with atelectasis. No pneumothorax. Stable 7 mm right upper lobe Peri fissural subpleural nodule, reference image 97/3. Upper Abdomen: No acute upper abdominal findings. Musculoskeletal: No acute or destructive bony abnormalities. Severe bilateral shoulder osteoarthritis. Reconstructed images demonstrate no additional findings. IMPRESSION: 1. Small free-flowing bilateral pleural effusions with dependent lower lobe atelectasis. 2. Stable 7 mm right upper lobe perifissural subpleural pulmonary nodule. As discussed on prior CT exam, minimal change since 06/03/2003 exam suggest benignity, and  confirmation with a follow-up CT May 2026 recommended. 3. Mild cardiomegaly. 4. Aortic Atherosclerosis (ICD10-I70.0). Coronary artery atherosclerosis Electronically Signed   By: Ozell Daring M.D.   On: 12/01/2023 15:33   DG Chest Port 1 View Result Date: 12/01/2023 CLINICAL DATA:  Shortness of breath. EXAM: PORTABLE CHEST 1 VIEW COMPARISON:  August 30, 2023 FINDINGS: There is stable dual lead AICD positioning. The cardiac silhouette is mildly enlarged. Mild, diffuse, chronic appearing increased interstitial lung markings are seen. Mild areas of atelectasis and/or infiltrate are noted within the bilateral lung bases. No pleural effusion or pneumothorax is identified. Multilevel degenerative changes are seen throughout the thoracic spine. IMPRESSION: Mild bibasilar atelectasis and/or infiltrate. Electronically Signed   By: Suzen Dials M.D.   On: 12/01/2023 13:39   CUP PACEART INCLINIC DEVICE CHECK Result Date: 12/01/2023 Normal in-clinic dual chamber pacemaker check. Presenting Rhythm: AS-VP (AF). Routine testing of thresholds, sensing, and impedance demonstrate stable parameters and no programming changes needed at this time. Unable to check atrial threshold with AF. On-going AF x ~ 1 month. Estimated longevity 60yr74mo. Pt enrolled in remote follow-up.    Procedures   Medications Ordered in the ED  apixaban  (ELIQUIS ) tablet 2.5 mg (has no administration in time range)  ipratropium-albuterol  (DUONEB) 0.5-2.5 (3) MG/3ML nebulizer solution 3 mL (3 mLs Nebulization Given 12/01/23 1436)  methylPREDNISolone  sodium succinate (SOLU-MEDROL ) 125 mg/2 mL injection 125 mg (125 mg Intravenous Given 12/01/23 1440)  furosemide  (LASIX ) injection 20 mg (20 mg Intravenous Given 12/01/23 1842)    Clinical Course as of 12/01/23 2056  Fri Dec 01, 2023  1528 SOB going on x 1 month worse x 1 week. Wheezing. Slightly improved with breathing treatments. Pending CT, treat pneumonia if present and likely admit.  [CB]     Clinical Course User Index [CB] Beola Terrall RAMAN, PA-C                                 Medical Decision Making Amount and/or Complexity of Data Reviewed Labs: ordered. Radiology: ordered.  Risk Prescription drug management. Decision regarding hospitalization.   Patient presents to the ED for concern of shortness of breath, this involves an extensive number of treatment options, and is a complaint that carries with it a high risk of complications and morbidity.  The differential diagnosis includes CHF exacerbation/new onset CHF, asthma exacerbation, pneumonia, pneumothorax, arrhythmia, PE, etc.    Co  morbidities that complicate the patient evaluation  hypertension, asthma, pacemaker, atrial fibrillation, coronary artery disease   Additional history obtained:  Wife and nursing   Lab Tests:  I Ordered, and personally interpreted labs.  The pertinent results include: CBC shows no elevated white blood cell count, hemoglobin of 12.1, BMP shows potassium 147, BUN of 30, glucose of 125, troponin of 34, BNP of 706   Imaging Studies ordered:  I ordered imaging studies including CXR, CT chest which was PENDING at time of my sign-out I independently visualized and interpreted CXR imaging which showed mild bibasilar atelectasis vs. infiltrate I agree with the radiologist interpretation   Cardiac Monitoring:  The patient was maintained on a cardiac monitor.  I personally viewed and interpreted the cardiac monitored which showed an underlying rhythm of: Paced rhythm    Medicines ordered and prescription drug management:  I ordered medication including solumedrol and duonebs for shortness of breath Reevaluation of the patient after these medicines showed that the patient improved I have reviewed the patients home medicines and have made adjustments as needed    Problem List / ED Course:  89 year old male who presents the emergency department with a chief complaint of shortness  of breath, patient has been short of breath for approximately 1 month with wife stating that he has gotten more short of breath especially this week, history of asthma, compliant with Trelegy, on blood thinners, does appreciate productive cough, also hx of heart failure however no lower extremity edema noted on exam, no pulmonary edema noted on CXR Compliant with eliquis  so lower clinical suspicion for PE Patient initially very tachypneic on examination with respirations 30 to 35 breaths/min around 93% on room air, on auscultation of lungs diffuse wheezing present, retractions present General lab work ordered as well as chest x-ray, VBG Chest x-ray significant for atelectasis versus infiltrate, will order CT chest to further evaluate Patient symptomatically treated with Solu-Medrol  as well as DuoNebs Patient did improve some after steroids as well as breathing treatments however patient still has diffuse wheezing however no longer as tachypneic on room air, BNP noted to be elevated however no noted extremity edema or notable pulmonary edema on CXR, will wait for CT chest for further asessment Patient signed out to [employer] PA-C pending further work-up and evaluation, at this time suspect asthma exacerbation vs. Pneumonia, vs. Heart failure exacerbation      Reevaluation:  After the interventions noted above, I reevaluated the patient and found that they have :improved   Dispostion:  Patient signed out to [employer] PA-C at time of sign-out who will determine further work-up and disposition      Final diagnoses:  Acute decompensated heart failure (HCC)  Dyspnea, unspecified type    ED Discharge Orders     None          Janetta Terrall JULIANNA DEVONNA 12/01/23 2057    Francesca Elsie CROME, MD 12/05/23 1751

## 2023-12-01 NOTE — ED Notes (Signed)
 Pt was bladder scan and it showed 255 mL

## 2023-12-01 NOTE — ED Notes (Signed)
Got patient into a gown on the monitor patient is resting with family at bedside and call bell in reach 

## 2023-12-01 NOTE — ED Provider Notes (Addendum)
  Physical Exam  BP 138/84   Pulse 69   Temp 97.7 F (36.5 C) (Oral)   Resp 17   Ht 5' 9 (1.753 m)   Wt 53.1 kg   SpO2 99%   BMI 17.28 kg/m   Physical Exam Vitals and nursing note reviewed.  Constitutional:      General: He is not in acute distress.    Appearance: He is well-developed. He is not ill-appearing.  Cardiovascular:     Rate and Rhythm: Normal rate.  Pulmonary:     Effort: Pulmonary effort is normal. Tachypnea present. No accessory muscle usage or respiratory distress.  Skin:    General: Skin is warm and dry.  Neurological:     General: No focal deficit present.     Mental Status: He is alert and oriented to person, place, and time.     Procedures  Procedures  ED Course / MDM   Clinical Course as of 12/01/23 1820  Fri Dec 01, 2023  1528 SOB going on x 1 month worse x 1 week. Wheezing. Slightly improved with breathing treatments. Pending CT, treat pneumonia if present and likely admit.  [CB]    Clinical Course User Index [CB] Beola Terrall RAMAN, PA-C   Medical Decision Making Amount and/or Complexity of Data Reviewed Labs: ordered. Radiology: ordered.  Risk Prescription drug management. Decision regarding hospitalization.   Patient care transferred over from [facility].  Patient is a 89 year old male was in the ED today for concerns for shortness of breath, worse x 1 week.  Notably previous medical history of CHF with a EF of 35 to 40% in 08/2023 with severe atrial dilation, moderate left ventricular dysfunction, AF anticoagulated on Eliquis  with no missed doses, asthma, HTN.  Currently on 3 L of O2, satting well however still mildly tachypneic.  Does not appear to be fluid overloaded with no lower leg edema.  However small pleural effusions noted on CT.  BNP also elevated suggesting likely heart failure as cause of patient symptoms today.  Will start small dose of Lasix  and look to admit.  Case was consulted with attending who agreed.  Spoke  with hospitalist, who excepted patient care at that time with [facility] hospitalist, Dr. Arthea.        Beola Terrall RAMAN, PA-C 12/01/23 1851    Beola Terrall RAMAN, PA-C 12/01/23 2331    Beola Terrall RAMAN, PA-C 12/01/23 2331    Elnor Jayson LABOR, DO 12/04/23 [id number]

## 2023-12-01 NOTE — ED Triage Notes (Signed)
 C/O [facility] for a month. Denies CP/n/v. Axox4.

## 2023-12-01 NOTE — Progress Notes (Signed)
  Electrophysiology Office Note:   ID:  Meilech, Virts 1926-09-14, MRN [id number]  Primary Cardiologist: Soyla DELENA Merck, MD Electrophysiologist: Fonda Kitty, MD      History of Present Illness:   Guy Espinoza is a 89 y.o. 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   Seen in AF clinic 11/02/2023 for new AF/AFL noted on device. Opted for rate control given minimal symptoms and desire to avoid further procedures.   Since last being seen in our clinic the patient reports doing poorly. He has had increasing SOB x 1 week. Has intermittenty had oxygen saturations, but has thus far not been able to qualify for oxygen. Seen by PCP and when ambulating only dropped to 91-92.  Today in clinic he was 87-88% after being pushed back, and rose to 90-92 with deep breathing. Briefly placed on O2 which did not seem to significantly improve his respiratory effort. Pt taking rapid shallow breaths. Wife states he has had chronic SOB but acutely worse for past week. Denies chest pain, syncope, or edema.  Taking medications as directed.   Denies fever or chills. Has been coughing. He intermittently spits up phlegm and says he feels better after.   Review of systems complete and found to be negative unless listed in HPI.   EP Information / Studies Reviewed:    EKG is not ordered today. EKG from 11/02/2023 reviewed which showed V paced rhythm at 70 bpm       PPM Interrogation-  reviewed in detail today,  See PACEART report.  Arrhythmia/Device History Abbott Dual Chamber PPM implanted 08/30/2022 for CHB     Physical Exam:   VS:  There were no vitals taken for this visit.   Wt Readings from Last 3 Encounters:  11/02/23 117 lb (53.1 kg)  09/08/23 117 lb (53.1 kg)  08/30/23 111 lb (50.3 kg)     GEN: Elderly appearing and with increased respiratory effort.  NECK: No JVD; No carotid bruits CARDIAC: Irregularly  irregular rate and rhythm, no murmurs, rubs, gallops RESPIRATORY: Diminished throughout with scattered wheeze ABDOMEN: Soft, non-tender, non-distended EXTREMITIES:  Trace edema; No deformity   ASSESSMENT AND PLAN:    CHB s/p Abbott PPM  Normal PPM function See Pace Art report No changes today  Persistent atrial fib Paroxysmal atrial flutter Continue eliquis  2.5 mg BID Had planned labwork, but pt going to ED. Follow up CBC.  Planning rate control, though ? If this is contributing to his worsened pulmonary status.  He took lasix  briefly in the summer, but is not currently on.   CAD No s/s of ischemia.      HTN Stable on current regimen   Acute hypoxic respiratory failure H/o Asthma Pulse ox 87-92% at rest.  Worse over the past week with increased respiratory effort, increased phlegm production, and intermittent confusion  Have recommended he proceed to ED visit for additional work up.  Suspect he will likely require oxygen for home use as he is likely to desaturate even further with exertion.   Disposition:   Follow up with EP Team in 6 months  Signed, Ozell Prentice Passey, PA-C

## 2023-12-01 NOTE — Patient Instructions (Signed)
 Medication Instructions:  Your physician recommends that you continue on your current medications as directed. Please refer to the Current Medication list given to you today.  *If you need a refill on your cardiac medications before your next appointment, please call your pharmacy*  Lab Work: None ordered If you have labs (blood work) drawn today and your tests are completely normal, you will receive your results only by: MyChart Message (if you have MyChart) OR A paper copy in the mail If you have any lab test that is abnormal or we need to change your treatment, we will call you to review the results.  Follow-Up: At [facility], you and your health needs are our priority.  As part of our continuing mission to provide you with exceptional heart care, our providers are all part of one team.  This team includes your primary Cardiologist (physician) and Advanced Practice Providers or APPs (Physician Assistants and Nurse Practitioners) who all work together to provide you with the care you need, when you need it.  Your next appointment:   6 month(s)  Provider:   You may see Fonda Kitty, MD or one of the following Advanced Practice Providers on your designated Care Team:   Charlies Arthur, PA-C Michael Andy Tillery, PA-C Suzann Riddle, NP Daphne Barrack, NP   We recommend signing up for the patient portal called MyChart.  Sign up information is provided on this After Visit Summary.  MyChart is used to connect with patients for Virtual Visits (Telemedicine).  Patients are able to view lab/test results, encounter notes, upcoming appointments, etc.  Non-urgent messages can be sent to your provider as well.   To learn more about what you can do with MyChart, go to [email or web address].

## 2023-12-02 DIAGNOSIS — I110 Hypertensive heart disease with heart failure: Secondary | ICD-10-CM | POA: Diagnosis present

## 2023-12-02 DIAGNOSIS — I5023 Acute on chronic systolic (congestive) heart failure: Secondary | ICD-10-CM | POA: Insufficient documentation

## 2023-12-02 DIAGNOSIS — I2781 Cor pulmonale (chronic): Secondary | ICD-10-CM | POA: Diagnosis present

## 2023-12-02 DIAGNOSIS — I482 Chronic atrial fibrillation, unspecified: Secondary | ICD-10-CM

## 2023-12-02 DIAGNOSIS — E7800 Pure hypercholesterolemia, unspecified: Secondary | ICD-10-CM | POA: Diagnosis present

## 2023-12-02 DIAGNOSIS — J441 Chronic obstructive pulmonary disease with (acute) exacerbation: Secondary | ICD-10-CM

## 2023-12-02 DIAGNOSIS — R0600 Dyspnea, unspecified: Secondary | ICD-10-CM | POA: Diagnosis not present

## 2023-12-02 DIAGNOSIS — E870 Hyperosmolality and hypernatremia: Secondary | ICD-10-CM | POA: Diagnosis present

## 2023-12-02 DIAGNOSIS — R638 Other symptoms and signs concerning food and fluid intake: Secondary | ICD-10-CM | POA: Diagnosis not present

## 2023-12-02 DIAGNOSIS — R0602 Shortness of breath: Secondary | ICD-10-CM | POA: Diagnosis present

## 2023-12-02 DIAGNOSIS — J690 Pneumonitis due to inhalation of food and vomit: Secondary | ICD-10-CM | POA: Diagnosis not present

## 2023-12-02 DIAGNOSIS — Z66 Do not resuscitate: Secondary | ICD-10-CM | POA: Diagnosis not present

## 2023-12-02 DIAGNOSIS — I447 Left bundle-branch block, unspecified: Secondary | ICD-10-CM | POA: Diagnosis present

## 2023-12-02 DIAGNOSIS — N390 Urinary tract infection, site not specified: Secondary | ICD-10-CM | POA: Diagnosis not present

## 2023-12-02 DIAGNOSIS — R7989 Other specified abnormal findings of blood chemistry: Secondary | ICD-10-CM | POA: Diagnosis not present

## 2023-12-02 DIAGNOSIS — Z681 Body mass index (BMI) 19 or less, adult: Secondary | ICD-10-CM | POA: Diagnosis not present

## 2023-12-02 DIAGNOSIS — R339 Retention of urine, unspecified: Secondary | ICD-10-CM | POA: Diagnosis not present

## 2023-12-02 DIAGNOSIS — B952 Enterococcus as the cause of diseases classified elsewhere: Secondary | ICD-10-CM | POA: Diagnosis not present

## 2023-12-02 DIAGNOSIS — Z950 Presence of cardiac pacemaker: Secondary | ICD-10-CM | POA: Diagnosis not present

## 2023-12-02 DIAGNOSIS — Z711 Person with feared health complaint in whom no diagnosis is made: Secondary | ICD-10-CM | POA: Diagnosis not present

## 2023-12-02 DIAGNOSIS — J9601 Acute respiratory failure with hypoxia: Secondary | ICD-10-CM | POA: Diagnosis present

## 2023-12-02 DIAGNOSIS — I10 Essential (primary) hypertension: Secondary | ICD-10-CM | POA: Diagnosis present

## 2023-12-02 DIAGNOSIS — J432 Centrilobular emphysema: Secondary | ICD-10-CM | POA: Diagnosis present

## 2023-12-02 DIAGNOSIS — Z789 Other specified health status: Secondary | ICD-10-CM | POA: Diagnosis not present

## 2023-12-02 DIAGNOSIS — I081 Rheumatic disorders of both mitral and tricuspid valves: Secondary | ICD-10-CM | POA: Diagnosis present

## 2023-12-02 DIAGNOSIS — Z7901 Long term (current) use of anticoagulants: Secondary | ICD-10-CM | POA: Diagnosis not present

## 2023-12-02 DIAGNOSIS — N400 Enlarged prostate without lower urinary tract symptoms: Secondary | ICD-10-CM | POA: Insufficient documentation

## 2023-12-02 DIAGNOSIS — R262 Difficulty in walking, not elsewhere classified: Secondary | ICD-10-CM | POA: Insufficient documentation

## 2023-12-02 DIAGNOSIS — Z515 Encounter for palliative care: Secondary | ICD-10-CM | POA: Diagnosis not present

## 2023-12-02 DIAGNOSIS — I4892 Unspecified atrial flutter: Secondary | ICD-10-CM | POA: Diagnosis present

## 2023-12-02 DIAGNOSIS — I2510 Atherosclerotic heart disease of native coronary artery without angina pectoris: Secondary | ICD-10-CM | POA: Diagnosis present

## 2023-12-02 DIAGNOSIS — M48061 Spinal stenosis, lumbar region without neurogenic claudication: Secondary | ICD-10-CM | POA: Diagnosis not present

## 2023-12-02 DIAGNOSIS — Z23 Encounter for immunization: Secondary | ICD-10-CM | POA: Diagnosis present

## 2023-12-02 DIAGNOSIS — E43 Unspecified severe protein-calorie malnutrition: Secondary | ICD-10-CM | POA: Diagnosis present

## 2023-12-02 DIAGNOSIS — R7303 Prediabetes: Secondary | ICD-10-CM | POA: Insufficient documentation

## 2023-12-02 DIAGNOSIS — Z7189 Other specified counseling: Secondary | ICD-10-CM | POA: Diagnosis not present

## 2023-12-02 DIAGNOSIS — I4430 Unspecified atrioventricular block: Secondary | ICD-10-CM | POA: Diagnosis not present

## 2023-12-02 DIAGNOSIS — I509 Heart failure, unspecified: Secondary | ICD-10-CM | POA: Diagnosis not present

## 2023-12-02 DIAGNOSIS — R0989 Other specified symptoms and signs involving the circulatory and respiratory systems: Secondary | ICD-10-CM | POA: Diagnosis not present

## 2023-12-02 DIAGNOSIS — R1319 Other dysphagia: Secondary | ICD-10-CM | POA: Diagnosis not present

## 2023-12-02 DIAGNOSIS — I272 Pulmonary hypertension, unspecified: Secondary | ICD-10-CM | POA: Diagnosis present

## 2023-12-02 LAB — CBC
HCT: 38.2 % — ABNORMAL LOW (ref 39.0–52.0)
Hemoglobin: 12.3 g/dL — ABNORMAL LOW (ref 13.0–17.0)
MCH: 31.9 pg (ref 26.0–34.0)
MCHC: 32.2 g/dL (ref 30.0–36.0)
MCV: 99 fL (ref 80.0–100.0)
Platelets: 177 K/uL (ref 150–400)
RBC: 3.86 MIL/uL — ABNORMAL LOW (ref 4.22–5.81)
RDW: 14.1 % (ref 11.5–15.5)
WBC: 6.4 K/uL (ref 4.0–10.5)
nRBC: 0 % (ref 0.0–0.2)

## 2023-12-02 LAB — BASIC METABOLIC PANEL WITH GFR
Anion gap: 12 (ref 5–15)
BUN: 31 mg/dL — ABNORMAL HIGH (ref 8–23)
CO2: 27 mmol/L (ref 22–32)
Calcium: 8.8 mg/dL — ABNORMAL LOW (ref 8.9–10.3)
Chloride: 108 mmol/L (ref 98–111)
Creatinine, Ser: 1.13 mg/dL (ref 0.61–1.24)
GFR, Estimated: 60 mL/min — ABNORMAL LOW (ref >60)
Glucose, Bld: 154 mg/dL — ABNORMAL HIGH (ref 70–99)
Potassium: 3.5 mmol/L (ref 3.5–5.1)
Sodium: 147 mmol/L — ABNORMAL HIGH (ref 135–145)

## 2023-12-02 LAB — PROTIME-INR
INR: 1.4 — ABNORMAL HIGH (ref 0.8–1.2)
Prothrombin Time: 17.8 s — ABNORMAL HIGH (ref 11.4–15.2)

## 2023-12-02 LAB — BRAIN NATRIURETIC PEPTIDE: B Natriuretic Peptide: 1075.2 pg/mL — ABNORMAL HIGH (ref 0.0–100.0)

## 2023-12-02 MED ORDER — METOPROLOL SUCCINATE ER 25 MG PO TB24
25.0000 mg | ORAL_TABLET | Freq: Every day | ORAL | Status: DC
  Administered 2023-12-02 – 2023-12-12 (×9): 25 mg via ORAL
  Filled 2023-12-02 (×10): qty 1

## 2023-12-02 MED ORDER — DICLOFENAC SODIUM 1 % EX GEL
2.0000 g | Freq: Four times a day (QID) | CUTANEOUS | Status: DC
  Administered 2023-12-02 – 2023-12-11 (×14): 2 g via TOPICAL
  Filled 2023-12-02: qty 100

## 2023-12-02 MED ORDER — TRAMADOL HCL 50 MG PO TABS
50.0000 mg | ORAL_TABLET | Freq: Four times a day (QID) | ORAL | Status: DC | PRN
  Administered 2023-12-08 – 2023-12-12 (×3): 50 mg via ORAL
  Filled 2023-12-02 (×3): qty 1

## 2023-12-02 MED ORDER — ACETAMINOPHEN 325 MG PO TABS
650.0000 mg | ORAL_TABLET | Freq: Four times a day (QID) | ORAL | Status: DC | PRN
  Administered 2023-12-09: 650 mg via ORAL
  Filled 2023-12-02: qty 2

## 2023-12-02 MED ORDER — METHYLPREDNISOLONE SODIUM SUCC 40 MG IJ SOLR
40.0000 mg | INTRAMUSCULAR | Status: DC
  Filled 2023-12-02: qty 1

## 2023-12-02 MED ORDER — SPIRONOLACTONE 12.5 MG HALF TABLET
12.5000 mg | ORAL_TABLET | Freq: Every day | ORAL | Status: DC
  Administered 2023-12-02 – 2023-12-12 (×11): 12.5 mg via ORAL
  Filled 2023-12-02 (×11): qty 1

## 2023-12-02 MED ORDER — APIXABAN 2.5 MG PO TABS
2.5000 mg | ORAL_TABLET | Freq: Two times a day (BID) | ORAL | Status: DC
  Administered 2023-12-02 – 2023-12-12 (×21): 2.5 mg via ORAL
  Filled 2023-12-02 (×22): qty 1

## 2023-12-02 MED ORDER — FUROSEMIDE 10 MG/ML IJ SOLN
40.0000 mg | Freq: Two times a day (BID) | INTRAMUSCULAR | Status: DC
  Administered 2023-12-02 – 2023-12-03 (×3): 40 mg via INTRAVENOUS
  Filled 2023-12-02 (×3): qty 4

## 2023-12-02 MED ORDER — IPRATROPIUM-ALBUTEROL 0.5-2.5 (3) MG/3ML IN SOLN
3.0000 mL | Freq: Four times a day (QID) | RESPIRATORY_TRACT | Status: DC | PRN
  Administered 2023-12-02 – 2023-12-04 (×2): 3 mL via RESPIRATORY_TRACT
  Filled 2023-12-02 (×2): qty 3

## 2023-12-02 MED ORDER — FUROSEMIDE 20 MG PO TABS
20.0000 mg | ORAL_TABLET | Freq: Every day | ORAL | Status: DC
  Filled 2023-12-02: qty 1

## 2023-12-02 MED ORDER — ADULT MULTIVITAMIN W/MINERALS CH
1.0000 | ORAL_TABLET | Freq: Every day | ORAL | Status: DC
  Administered 2023-12-02 – 2023-12-12 (×11): 1 via ORAL
  Filled 2023-12-02 (×11): qty 1

## 2023-12-02 MED ORDER — EMPAGLIFLOZIN 10 MG PO TABS
10.0000 mg | ORAL_TABLET | Freq: Every day | ORAL | Status: DC
  Administered 2023-12-02 – 2023-12-05 (×4): 10 mg via ORAL
  Filled 2023-12-02 (×4): qty 1

## 2023-12-02 MED ORDER — SORBITOL 70 % SOLN: 30.0000 mL | Freq: Every day | Status: DC | PRN

## 2023-12-02 MED ORDER — LOSARTAN POTASSIUM 25 MG PO TABS
25.0000 mg | ORAL_TABLET | Freq: Every day | ORAL | Status: DC
  Administered 2023-12-02 – 2023-12-12 (×7): 25 mg via ORAL
  Filled 2023-12-02 (×10): qty 1

## 2023-12-02 MED ORDER — ATORVASTATIN CALCIUM 10 MG PO TABS
20.0000 mg | ORAL_TABLET | Freq: Every day | ORAL | Status: DC
  Administered 2023-12-02 – 2023-12-12 (×11): 20 mg via ORAL
  Filled 2023-12-02 (×11): qty 2

## 2023-12-02 MED ORDER — PREDNISONE 20 MG PO TABS
40.0000 mg | ORAL_TABLET | Freq: Every day | ORAL | Status: DC
  Administered 2023-12-02 – 2023-12-12 (×11): 40 mg via ORAL
  Filled 2023-12-02 (×11): qty 2

## 2023-12-02 MED ORDER — ACETAMINOPHEN 650 MG RE SUPP: 650.0000 mg | Freq: Four times a day (QID) | RECTAL | Status: DC | PRN

## 2023-12-02 MED ORDER — ONDANSETRON HCL 4 MG PO TABS: 4.0000 mg | ORAL_TABLET | Freq: Four times a day (QID) | ORAL | Status: DC | PRN

## 2023-12-02 MED ORDER — ONDANSETRON HCL 4 MG/2ML IJ SOLN: 4.0000 mg | Freq: Four times a day (QID) | INTRAMUSCULAR | Status: DC | PRN

## 2023-12-02 MED ORDER — BUDESON-GLYCOPYRROL-FORMOTEROL 160-9-4.8 MCG/ACT IN AERO
2.0000 | INHALATION_SPRAY | Freq: Two times a day (BID) | RESPIRATORY_TRACT | Status: DC
  Administered 2023-12-02 – 2023-12-09 (×14): 2 via RESPIRATORY_TRACT
  Filled 2023-12-02 (×2): qty 5.9

## 2023-12-02 MED ORDER — TAMSULOSIN HCL 0.4 MG PO CAPS
0.4000 mg | ORAL_CAPSULE | Freq: Every day | ORAL | Status: DC
  Administered 2023-12-02 – 2023-12-11 (×10): 0.4 mg via ORAL
  Filled 2023-12-02 (×10): qty 1

## 2023-12-02 NOTE — Assessment & Plan Note (Signed)
 Continue blood pressure control with losartan and metoprolol succinate.

## 2023-12-02 NOTE — Assessment & Plan Note (Signed)
 Continue statin therapy

## 2023-12-02 NOTE — Progress Notes (Signed)
 PHARMACY - ANTICOAGULATION CONSULT NOTE  Pharmacy Consult for apixaban  Indication: atrial fibrillation  No Known Allergies  Patient Measurements: Height: 5' 9 (175.3 cm) Weight: 53.1 kg (117 lb) IBW/kg (Calculated) : 70.7 HEPARIN  DW (KG): 53.1  Vital Signs: Temp: 97.5 F (36.4 C) (09/05 1843) Temp Source: Oral (09/05 1843) BP: 141/94 (09/06 0000) Pulse Rate: 70 (09/06 0020)  Labs: Recent Labs    12/01/23 1445 12/01/23 1452 12/01/23 1521  HGB 12.1* 12.2*  12.6*  --   HCT 38.1* 36.0*  37.0*  --   PLT 194  --   --   CREATININE 0.94 1.00  --   TROPONINIHS 34*  --  33*    Estimated Creatinine Clearance: 33.2 mL/min (by C-G formula based on SCr of 1 mg/dL).   Medical History: Past Medical History:  Diagnosis Date   Asthma    Hypertension       Assessment: 89 yo m  on apixaban  PTA for afib. Resume per consult.   Goal of Therapy:  Monitor platelets by anticoagulation protocol: Yes   Plan:  Apixaban  2.5mg  bid  Monitor signs/symptoms of bleeding   Jinnie Door, PharmD, BCPS, BCCP Clinical Pharmacist  Please check AMION for all [facility] phone numbers After 10:00 PM, call Main Pharmacy [phone]

## 2023-12-02 NOTE — Assessment & Plan Note (Signed)
 Renal function with serum cr at 1,1 with K at 4.0 and serum bicarbonate at 31  Na 140  Continue with spironolactone  Follow up renal function and electrolytes in am.

## 2023-12-02 NOTE — Assessment & Plan Note (Signed)
 Pt and OT.

## 2023-12-02 NOTE — Progress Notes (Addendum)
 Progress Note   Guy Espinoza: Guy Espinoza FMW:[id number] DOB: [birth year] DOA: 12/01/2023     0 DOS: the Guy Espinoza was seen and examined on 12/02/2023   Brief hospital course: Guy Espinoza was admitted to the hospital with the working diagnosis of heart failure exacerbation.   89 yo male with the past medical history of atrial fibrillation, hypertension, coronary artery disease and pulmonary hypertension, who presented with dyspnea.  Reported about 1 to 2 weeks of dyspnea on exertion, denied any chest pain, edema or palpitations. Positive cough and increased sputum production. On the day of admission he was evaluated at the EP clinic and was found hypoxemic, 87 to 88% on ambient air along with trace edema at his lower extremities. He was referred to the ED for further evaluation.  On his initial physical examination his blood pressure was 130/85, HR 61, RR 26 and 02 saturation 99% on supplemental 02 per [place]. Lungs with decreased breath sounds with no wheezing, heart with S1 and S2 present, irregularly irregular with no gallops or rubs, abdomen with no distention and positive lower extremity edema.   Chest radiograph with hyperinflation, positive cardiomegaly, mild hilar vascular congestion, pacemaker in place with one lead in the right atrium and one in the right ventricle.   CT chest with centrilobular emphysema, small bilateral pleural effusions, with bilateral basal atelectasis.  Stable 7 mm right upper lobe peri fissural subpleural pulmonary nodule, mild cardiomegaly.    EKG 71 bpm, left axis deviation, left bundle branch block, qtc 508, atrial flutter with ventricular paced rhythm, no significant ST segment or T wave changes.   Assessment and Plan: * Acute on chronic systolic CHF (congestive heart failure) (HCC) Echocardiogram with reduced LV systolic function with EF 35 to 40%, global hypokinesis, mild dilatated LV cavity, grade II diastolic dysfunction (pseudo-normalization for E/A), RV systolic  function preserved, RV with mild enlargement, RVSP 72.8 mmHg,  Severe dilatation right and left atrium, moderate to severe mitral valve regurgitation, moderate tricuspid regurgitation, no aortic stenosis. Trivial pericardial effusion.   Acute on chronic cor pulmonale Pulmonary hypertension   Improved volume status, not yet euvolemic state.  Systolic blood pressure 130 mmHg.   Plan to continue diuresis with furosemide  40 mg IV bid Add SGLT 2 inh and spironolactone .  Continue losartan  and metoprolol  succinate.   Acute hypoxemic respiratory failure due to acute cardiogenic pulmonary edema, and COPD exacerbation. Plan to continue diuresis and bronchodilator therapy along with steroids Today 02 saturation is 95% on 3 L/min  Guy Espinoza may need supplemental 02 at home.   Essential hypertension Continue blood pressure control with losartan  and metoprolol .   Coronary artery disease involving native coronary artery of native heart Continue blood pressure control with losartan  and metoprolol  succinate.   Atrial fibrillation, chronic (HCC) Continue metoprolol  succinate for rate control. Continue anticoagulation with apixaban .  Continue telemetry monitoring   Hypernatremia Renal function stable with serum cr at 1,13 with K at 3,5 and serum bicarbonate at 27  Na 147   Continue diuresis with furosemide , add SGLT 2 inh and spironolactone  Follow up renal function and electrolytes in am.   Spinal stenosis Pt and OT.   COPD with acute exacerbation (HCC) Continue systemic corticosteroids with prednisone  40 mg po daily.  Bronchodilator therapy and inhaled corticosteroids.  Continue oxymetry monitoring and supplemental 02 per [place].   Pure hypercholesterolemia Continue statin therapy   Subjective: Guy Espinoza with improvement in dyspnea but not back to baseline, continue very weak and deconditioned   Physical Exam: Vitals:  12/02/23 0049 12/02/23 0115 12/02/23 0408 12/02/23 0720  BP:  139/78  130/80 117/80  Pulse:  66 70 77  Resp:  (!) 28 15 20   Temp: 97.7 F (36.5 C)  97.6 F (36.4 C) 97.7 F (36.5 C)  TempSrc: Oral  Oral Oral  SpO2:  98% 96% 95%  Weight:      Height:       Neurology awake and alert, deconditioned ENT with mild pallor Cardiovascular with S1 and S2 present and regular with no gallops or rubs, positive systolic murmur at the apex.  Positive JVD No lower extremity edema Respiratory with positive expiratory wheezing and prolonged expiratory phase with distant breath sounds and scattered rhonchi  Abdomen with no distention   Data Reviewed:    Family Communication: no family at the bedside.  I spoke with Guy Espinoza over the phone , we talked in detail about Guy condition, plan of care and prognosis and all questions were addressed.    Disposition: Status is: Inpatient Remains inpatient appropriate because: recovering respiratory failure   Planned Discharge Destination: Home     Author: Elidia Toribio Furnace, MD 12/02/2023 8:12 AM  For on call review [email or web address].

## 2023-12-02 NOTE — ED Notes (Signed)
 Condom Catheter applied due to patient being on Lasix  and unable to make it to the restroom or urinal in a timely manner.

## 2023-12-02 NOTE — Assessment & Plan Note (Signed)
 Continue metoprolol  succinate for rate control. Continue anticoagulation with apixaban .  Continue telemetry monitoring

## 2023-12-02 NOTE — Assessment & Plan Note (Signed)
 Continue systemic corticosteroids with prednisone  40 mg po daily.  Bronchodilator therapy and inhaled corticosteroids.  Continue oxymetry monitoring and supplemental 02 per [place].  Add airway clearing techniques with flutter valve and incentive spirometer.

## 2023-12-02 NOTE — Progress Notes (Signed)
   12/02/23 0408  Vitals  Temp 97.6 F (36.4 C)  Temp Source Oral  BP 130/80  MAP (mmHg) 96  BP Location Right Arm  BP Method Automatic  Patient Position (if appropriate) Lying  Pulse Rate 70  Pulse Rate Source Monitor  ECG Heart Rate 70  Resp 15  Level of Consciousness  Level of Consciousness Alert  MEWS COLOR  MEWS Score Color Green  Oxygen Therapy  SpO2 96 %  O2 Device Nasal Cannula  Pain Assessment  Pain Scale 0-10  Pain Score 0  MEWS Score  MEWS Temp 0  MEWS Systolic 0  MEWS Pulse 0  MEWS RR 0  MEWS LOC 0  MEWS Score 0   Pt admitted to [facility] from ED. Pt oriented to unit. CCMD called. CHG bath given. Call light within reach.

## 2023-12-02 NOTE — Assessment & Plan Note (Addendum)
 Echocardiogram with reduced LV systolic function with EF 35 to 40%, global hypokinesis, mild dilatated LV cavity, grade II diastolic dysfunction (pseudo-normalization for E/A), RV systolic function preserved, RV with mild enlargement, RVSP 72.8 mmHg,  Severe dilatation right and left atrium, moderate to severe mitral valve regurgitation, moderate tricuspid regurgitation, no aortic stenosis. Trivial pericardial effusion.   Acute on chronic cor pulmonale Pulmonary hypertension   Improved volume status, not yet euvolemic state.  Systolic blood pressure 130 mmHg.   Plan to continue diuresis with furosemide  40 mg IV bid Add SGLT 2 inh and spironolactone .  Continue losartan  and metoprolol  succinate.   Acute hypoxemic respiratory failure due to acute cardiogenic pulmonary edema, and COPD exacerbation. Plan to continue diuresis and bronchodilator therapy along with steroids Today 02 saturation is 95% on 3 L/min  Patient may need supplemental 02 at home.

## 2023-12-02 NOTE — Assessment & Plan Note (Signed)
Continue blood pressure control with losartan and metoprolol.  ?

## 2023-12-02 NOTE — Hospital Course (Addendum)
 Guy Espinoza was admitted to the hospital with the working diagnosis of heart failure exacerbation.   89 yo male with the past medical history of atrial fibrillation, hypertension, coronary artery disease and pulmonary hypertension, who presented with dyspnea.  Reported about 1 to 2 weeks of dyspnea on exertion, denied any chest pain, edema or palpitations. Positive cough and increased sputum production. On the day of admission he was evaluated at the EP clinic and was found hypoxemic, 87 to 88% on ambient air along with trace edema at his lower extremities. He was referred to the ED for further evaluation.  On his initial physical examination his blood pressure was 130/85, HR 61, RR 26 and 02 saturation 99% on supplemental 02 per [place]. Lungs with decreased breath sounds with no wheezing, heart with S1 and S2 present, irregularly irregular with no gallops or rubs, abdomen with no distention and positive lower extremity edema.   VBG 7,48/ 38.3/ 37/ 28/ 75%  Na 147, K 3.8 Cl 107 bicarbonate 27, bun 125, glucose 125 bun 30 cr 0,94   Chest radiograph with hyperinflation, positive cardiomegaly, mild hilar vascular congestion, pacemaker in place with one lead in the right atrium and one in the right ventricle.   CT chest with centrilobular emphysema, small bilateral pleural effusions, with bilateral basal atelectasis.  Stable 7 mm right upper lobe peri fissural subpleural pulmonary nodule, mild cardiomegaly.    EKG 71 bpm, left axis deviation, left bundle branch block, qtc 508, atrial flutter with ventricular paced rhythm, no significant ST segment or T wave changes.   Patient placed on diuresis, bronchodilator therapy and steroids.  09/08 clinically improving, will need supplemental 02 at home.  09/09 patient with worsening increased of breathing and debility Chest radiograph with worsening right lower lobe infiltrate, likely aspiration pneumonia.

## 2023-12-02 NOTE — Plan of Care (Signed)
   Problem: Health Behavior/Discharge Planning: Goal: Ability to manage health-related needs will improve Outcome: Progressing   Problem: Clinical Measurements: Goal: Ability to maintain clinical measurements within normal limits will improve Outcome: Progressing Goal: Will remain free from infection Outcome: Progressing Goal: Diagnostic test results will improve Outcome: Progressing Goal: Respiratory complications will improve Outcome: Progressing

## 2023-12-03 ENCOUNTER — Ambulatory Visit: Payer: Self-pay | Admitting: Cardiology

## 2023-12-03 DIAGNOSIS — I482 Chronic atrial fibrillation, unspecified: Secondary | ICD-10-CM | POA: Diagnosis not present

## 2023-12-03 DIAGNOSIS — I10 Essential (primary) hypertension: Secondary | ICD-10-CM | POA: Diagnosis not present

## 2023-12-03 DIAGNOSIS — I5023 Acute on chronic systolic (congestive) heart failure: Secondary | ICD-10-CM | POA: Diagnosis not present

## 2023-12-03 DIAGNOSIS — I2510 Atherosclerotic heart disease of native coronary artery without angina pectoris: Secondary | ICD-10-CM | POA: Diagnosis not present

## 2023-12-03 DIAGNOSIS — E870 Hyperosmolality and hypernatremia: Secondary | ICD-10-CM

## 2023-12-03 LAB — BASIC METABOLIC PANEL WITH GFR
Anion gap: 11 (ref 5–15)
BUN: 49 mg/dL — ABNORMAL HIGH (ref 8–23)
CO2: 30 mmol/L (ref 22–32)
Calcium: 8.7 mg/dL — ABNORMAL LOW (ref 8.9–10.3)
Chloride: 103 mmol/L (ref 98–111)
Creatinine, Ser: 1.12 mg/dL (ref 0.61–1.24)
GFR, Estimated: >60 mL/min (ref >60)
Glucose, Bld: 143 mg/dL — ABNORMAL HIGH (ref 70–99)
Potassium: 3.6 mmol/L (ref 3.5–5.1)
Sodium: 144 mmol/L (ref 135–145)

## 2023-12-03 LAB — MAGNESIUM: Magnesium: 2.2 mg/dL (ref 1.7–2.4)

## 2023-12-03 MED ORDER — ENSURE PLUS HIGH PROTEIN PO LIQD
237.0000 mL | Freq: Two times a day (BID) | ORAL | Status: DC
  Administered 2023-12-05 – 2023-12-11 (×10): 237 mL via ORAL

## 2023-12-03 NOTE — Evaluation (Signed)
 Clinical/Bedside Swallow Evaluation Patient Details  Name: Guy Espinoza MRN: [id number] Date of Birth: 1926-04-19  Today's Date: 12/03/2023 Time: SLP Start Time (ACUTE ONLY): 1640 SLP Stop Time (ACUTE ONLY): 1655 SLP Time Calculation (min) (ACUTE ONLY): 15 min  Past Medical History:  Past Medical History:  Diagnosis Date   Asthma    Hypertension    Past Surgical History:  Past Surgical History:  Procedure Laterality Date   CATARACT EXTRACTION Left 2007   double hernia repair  2010   HERNIA REPAIR  1984   KNEE SURGERY Left 1992   PACEMAKER IMPLANT N/A 08/30/2023   Procedure: PACEMAKER IMPLANT;  Surgeon: Kennyth Chew, MD;  Location: [facility] INVASIVE CV LAB;  Service: Cardiovascular;  Laterality: N/A;   RIGHT/LEFT HEART CATH AND CORONARY ANGIOGRAPHY N/A 08/10/2023   Procedure: RIGHT/LEFT HEART CATH AND CORONARY ANGIOGRAPHY;  Surgeon: [place], Peter M, MD;  Location: [facility] INVASIVE CV LAB;  Service: Cardiovascular;  Laterality: N/A;   ROTATOR CUFF REPAIR Left 2001   ruptured blood vessel L eye Left 2008   HPI:  Patient is a 89 y.o. male with PMH: a-fib, HTN, asthma, pacemaker, pulmonary hypertension, severe CAD with multiple blockages per cath 07/2023. He presented to the hospital on 12/02/23 for SOB. CT showed bilateral pleural effusion and pulmonary nodule. SLP swallow evaluation ordered secondary to coughing with PO intake.    Assessment / Plan / Recommendation  Clinical Impression  Patient presents with clinical s/s of dysphagia as per this bedside swallow evaluation. Patient and spouse both report that he has been having coughing with PO intake for at least the past month and patient commenting it happens at the end of the meal. Per chart review, no documented history of pharyngeal or esophageal phase dysphagia. SLP assesssed patient's swallow as he took straw sips of thin liquids (water), exhibiting immediate and slightly delayed cough response with full recovery. Based on patient's age,  presentation and his and his spouse's report, SLP suspects he has a chronic versus acute on chronic dysphagia and recommendation is to proceed with MBS. Patient and spouse both in agreement with this plan. In the meantime, SLP recommends patient continue with current PO diet and continue with the safety measures he already uses at baseline (sitting up, small bites and small sips, etc). SLP Visit Diagnosis: Dysphagia, unspecified (R13.10)    Aspiration Risk  Mild aspiration risk    Diet Recommendation Other (Comment) (continue with regular solids, thin liquids pending MBS)    Medication Administration: Whole meds with liquid Supervision: Patient able to self feed Compensations: Slow rate;Small sips/bites Postural Changes: Seated upright at 90 degrees    Other  Recommendations Oral Care Recommendations: Oral care BID     Assistance Recommended at Discharge    Functional Status Assessment Patient has had a recent decline in their functional status and demonstrates the ability to make significant improvements in function in a reasonable and predictable amount of time.  Frequency and Duration min 1 x/week  1 week       Prognosis Prognosis for improved oropharyngeal function: Good      Swallow Study   General Date of Onset: 12/03/23 HPI: Patient is a 89 y.o. male with PMH: a-fib, HTN, asthma, pacemaker, pulmonary hypertension, severe CAD with multiple blockages per cath 07/2023. He presented to the hospital on 12/02/23 for SOB. CT showed bilateral pleural effusion and pulmonary nodule. SLP swallow evaluation ordered secondary to coughing with PO intake. Type of Study: Bedside Swallow Evaluation Previous Swallow Assessment: none found  Diet Prior to this Study: Regular;Thin liquids (Level 0) Temperature Spikes Noted: No Respiratory Status: Nasal cannula History of Recent Intubation: No Behavior/Cognition: Alert;Cooperative;Pleasant mood Oral Cavity Assessment: Within Functional Limits Oral  Care Completed by SLP: No Oral Cavity - Dentition: Adequate natural dentition Vision: Functional for self-feeding Self-Feeding Abilities: Able to feed self Patient Positioning: Upright in bed Baseline Vocal Quality: Normal Volitional Swallow: Able to elicit    Oral/Motor/Sensory Function Overall Oral Motor/Sensory Function: Within functional limits   Ice Chips     Thin Liquid Thin Liquid: Impaired Presentation: Self Fed;Straw Pharyngeal  Phase Impairments: Cough - Delayed;Cough - Immediate    Nectar Thick     Honey Thick     Puree Puree: Not tested   Solid     Solid: Not tested      Norleen IVAR Blase, MA, CCC-SLP Speech Therapy

## 2023-12-03 NOTE — Progress Notes (Signed)
 Initial Nutrition Assessment  DOCUMENTATION CODES:   Underweight  INTERVENTION:   -Liberalize diet to 2 gram sodium for wider variety of meal selections -MVI with minerals daily -Ensure Plus High Protein po BID, each supplement provides 350 kcal and 20 grams of protein  -RD provided Heart Failure Nutrition Therapy for the Undernourished handout from [facility] Nutrition Care Manual; attached to AVS/ discharge summary   NUTRITION DIAGNOSIS:   Increased nutrient needs related to chronic illness (CHF) as evidenced by estimated needs.  GOAL:   Patient will meet greater than or equal to 90% of their needs  MONITOR:   PO intake, Supplement acceptance  REASON FOR ASSESSMENT:   Consult Assessment of nutrition requirement/status  ASSESSMENT:   Pt with medical history significant for atrial fibrillation on Eliquis , hypertension, pacemaker, CAD, pulmonary hypertension, and severe coronary artery disease with multiple blockages per cath 07/2023.  He is currently under medical management.  Pt admitted with acute CHF exacerbation.  Reviewed I/O's: -1.4 L x 24 hours and -1.7 L since admission  UOP: 1.4 L x 24 hours  Pt unavailable at time of visit. Attempted to speak with pt via call to hospital room phone, however, unable to reach. RD unable to obtain further nutrition-related history or complete nutrition-focused physical exam at this time.    Per H&P, pt has had increasing shortness of breath with exertion for the past 1-2 weeks PTA.  Pt currently on a heart healthy diet with 1.2 L fluid restriction. No meal completion data available to assess at this time.   Per MD notes, pt is very weak and deconditioned. May require supplemental oxygen at home.   Reviewed wt hx; wt has been stable over the past 3 months. Unsure if most recent wt is a stated weight vs measured wt. RD will obtain new wt to better assess weight changes. Highly suspect malnutrition given underweight status, advanced  age, and multiple co-morbidities, however, unable to identify at this time. Pt would greatly benefit from addition of oral nutrition supplements.   Medications reviewed and include jardiance  and prednisone .   Labs reviewed.    Diet Order:   Diet Order             Diet Heart Room service appropriate? Yes; Fluid consistency: Thin; Fluid restriction: 1200 mL Fluid  Diet effective now                   EDUCATION NEEDS:   No education needs have been identified at this time  Skin:  Skin Assessment: Reviewed RN Assessment  Last BM:  12/02/23  Height:   Ht Readings from Last 1 Encounters:  12/01/23 5' 9 (1.753 m)    Weight:   Wt Readings from Last 1 Encounters:  12/01/23 53.1 kg    Ideal Body Weight:  72.7 kg  BMI:  Body mass index is 17.28 kg/m.  Estimated Nutritional Needs:   Kcal:  1600-1800  Protein:  80-95 grams  Fluid:  1.2 L    Margery ORN, RD, LDN, CDCES Registered Dietitian III Certified Diabetes Care and Education Specialist If unable to reach this RD, please use RD Inpatient group chat on secure chat between hours of 8am-4 pm daily

## 2023-12-03 NOTE — Discharge Instructions (Signed)
Heart Failure Nutrition Therapy For The Undernourished  This nutrition therapy will help you eat more calories and protein in addition to helping your heart. These suggestions can help you if you can't eat enough, have lost weight, or need extra calories and protein in your diet. This plan focuses on: Eating more calories.  Eating more calories can give you more energy, help you gain weight, and promote wound healing. Eating more protein. Protein can help you heal, give you energy, and build and repair muscle. Ask your registered dietitian nutritionist (RDN) how much protein is the right amount for you. Eating a low-sodium diet while increasing your calorie and protein intake. This will help control buildup of fluids around your heart, stomach, lungs, and legs. Too much sodium may make your blood pressure too high and put stress on your heart. You can achieve these goals by: Eating more calories and protein. Eating less than 2,000 milligrams of sodium per day. Reading food labels to keep track of how much sodium, protein, and calories are in the foods you eat. Limiting fluid intake if your doctor has asked you to follow a fluid restriction. Reading the Food Label: How Much Sodium Is too Much? The nutrition plan for heart failure usually limits the sodium that you get from food and beverages to 2,000 milligrams per day. Salt is the main source of sodium. Read the nutrition label to find out how much sodium is in 1 serving. Foods with more than 300 milligrams of sodium per serving may not fit into a reduced-sodium meal plan. Check serving sizes on the label. If you eat more than 1 serving, you will get more sodium than the amount listed. You can find protein and calories on the Nutrition Facts label too. Eating More Protein and Calories Eat at least 6 small meals throughout the day. If you become full quickly after you begin eating, you may benefit from small, frequent meals instead of 3 large  meals. Keep high-calorie and high-protein snacks available in your car or bag for when you get hungry. Add extra calories and protein when cooking meals. Cook with unsalted butter, margarine, or oils instead of calorie-free cooking spray. Use reduced-fat (2%) milk instead of fat-free (skim) milk. Ask your RDN if whole milk is recommended for you. Add milk powder to protein shakes, cereal, or casseroles. Sprinkle unsalted nuts and seeds into your cereal, stir-fry, or salad. Add 1 tablespoon mayonnaise, sugar, or honey to foods (adds 50 to 100 calories). Add calories and protein to your fruits and vegetables. Fruits are low in calories. Add peanut butter, yogurt, or cottage cheese to add calories and protein. Buy fruit cups canned in syrup instead of water or juice. Vegetables are also low in calories. Add butter, sour cream, margarine, or oil for extra calories. Potatoes, corn, and peas are higher in calories than nonstarchy vegetables. Avocados are rich in calories and healthy fat. Eat them alone or use them as a topping or spread. Limit foods that have little to no nutrition. Eat fewer calorie-free and low-calorie foods such as applesauce, Jell-O, and diet soft drinks. These foods will take up space in your stomach and may leave little room for higher-calorie foods and beverages.  When shopping, avoid products that say "low calorie" or "low fat." Drink high-calorie beverages. There are several liquid nutrition supplements available that also have less than 300 milligrams of sodium per serving. Drink them between meals as snacks.  Make your own supplement at home with milk or ice   cream.  Ask your RDN if protein powder would also be a good addition. Milk and juice are high in calories. Limit plain tea, coffee, and water. These have no calories.  Foods Recommended Food Group Foods Recommended  Grains Whole wheat bread with less than 80 milligrams of sodium per slice  Many cold cereals,  especially shredded wheat and granola Oats or cream of wheat made with milk (add butter or margarine, sugar, dried fruit, and nuts for more calories and fat) Quinoa Wheat germ (sprinkle on soups, baked goods, or cereal)  Vegetables Fresh and frozen vegetables without added sauces, salt, or sodium Homemade soups (salt free or low sodium) with dry milk powder or cream Potatoes, corn, and peas  Fruits Canned fruits (canned in heavy syrup) Dried fruits, such as raisins, cranberries, and prunes Avocados  Dairy (Milk and Milk Products) Milk or milk powder Soy milk Yogurt, including Greek yogurt Small amounts of natural, blocked cheeses or reduced-sodium cheese (Swiss, ricotta, and fresh mozzarella are lower in sodium than others) Regular or soft cream cheese and low-sodium cottage cheese  Protein Foods (Meat, Poultry, Fish, and Beans) Fresh meat and fish Turkey bacon (check the Nutrition Facts label to make sure its not packaged in a sodium solution) Tuna canned or packed in oil Dried beans, peas, and legumes; edamame (fresh soybeans) Eggs Unsalted nuts or peanut butter  Desserts and Snacks Apple (with peanut butter); baked apple with sugar and butter             Granola bars Pudding made with reduced-fat (2%) milk or milk powder Smoothies  Custard Chocolate syrup added to milk or ice cream  Fats Tub or liquid margarine (trans fat-free) Butter (check with your doctor or RDN first) Unsaturated fat oils (canola, olive, corn, sunflower, safflower, peanut, vegetable, and soybean) Flaxseed (oil or ground)  Sodium-Free Condiments Fresh or dried herbs; pepper; vinegar; lemon juice or lime juice; salt-free seasoning mixes and marinades such as Mrs. Dash or McCormick's salt-free blend; simple salad dressings such as vinegar and oil; low-sodium ketchup. You can purchase salt-free barbecue sauce and many others on the Internet.  Ask your RDN.     Foods Not Recommended Food Group Foods Not  Recommended  Grains Breads or crackers topped with salt Cereals (hot/cold) with more than 300 milligrams sodium per serving Biscuits, cornbread, and other "quick" breads prepared with baking soda Prepackaged bread crumbs Self-rising flours  Vegetables Canned vegetables (unless they are salt free or low sodium) Frozen vegetables with high-sodium seasoning and sauces Sauerkraut and pickled vegetables Canned or dried soups (unless they are salt free or low  sodium) French fries and onion rings Broth-based soups  Fruits Dried fruits preserved with sodium-containing additives  Dairy (Milk and Milk Products) Buttermilk Processed cheeses such as Cheese Wiz, Velveeta, and Queso Feta cheese Shredded cheese (has more sodium than blocked cheeses) "Singles" cheese slices and string cheese  Protein Foods (Meat, Poultry, Fish, and Beans) Cured meats (bacon, ham, sausage, pepperoni, and hot dogs)  Canned meats (chili, Vienna sausage, sardines, and Spam) Smoked fish and meats Frozen meals with more than 600 milligrams of sodium Egg Beaters   Fats Salted butter or margarine  Condiments Salt, sea salt, kosher salt, onion salt, and garlic salt Seasoning mixes containing salt such as Lemon Pepper or Lawry's Bouillon cubes Catsup or ketchup Barbecue sauce and Worcestershire sauce Soy sauce Salsa, pickles, olives, relish Salad dressings: ranch, blue cheese, Italian, and French  Alcohol Check with your doctor.   Heart Failure (  Undernourished) Sample 1-Day Menu View Nutrient Info Breakfast 1 cup oatmeal made with milk 1 tablespoon wheat germ (sprinkle on oatmeal) 1 cup (240 milliliters) reduced-fat (2%) milk 2 tablespoons chocolate syrup (add to milk) 1 banana 2 tablespoons peanut butter (for banana)  Morning Snack 1/4 cup raw almonds Cranberries (add to almonds) 1 cup (240 milliliters) oral nutritional supplement  Lunch 3 ounces grilled chicken breast 1 small potato 2 ounces cheese (to add to  potato) 2 tablespoons margarine (to add to potato) Croutons (add to salad) Walnuts (add to salad) Vinegar and oil dressing (add to salad) 1 cup (240 milliliters) juice  Afternoon Snack 10 tortilla chips Guacamole (avocado, lime juice, onion, and tomatoes, and pepper) 1 cup (240 milliliters) water or juice  Evening Meal 3 ounces herb-baked fish 1 cup homemade mashed potatoes with margarine (trans fat-free) and milk 1/2 cup creamed spinach 1 cup (240 milliliters) water or juice  Evening Snack 1 cup (240 milliliters) homemade milkshake 1 slice low-sodium turkey breast 1 slice whole wheat bread 1 slice cheese Mayonnaise  Daily Sum Nutrient Unit Value  Macronutrients  Energy kcal 3461  Energy kJ 14480  Protein g 151  Total lipid (fat) g 158  Carbohydrate, by difference g 381  Fiber, total dietary g 36  Sugars, total g 163  Minerals  Calcium, Ca mg 2598  Iron, Fe mg 32  Sodium, Na mg 2530  Vitamins  Vitamin C, total ascorbic acid mg 180  Vitamin A, IU IU 9009  Vitamin D IU 575  Lipids  Fatty acids, total saturated g 43  Fatty acids, total monounsaturated g 56  Fatty acids, total polyunsaturated g 48  Cholesterol mg 252     Heart Failure (Undernourished) Vegan Sample 1-Day Menu View Nutrient Info Breakfast 1 cup oatmeal made with: 1 cup soymilk fortified with calcium, vitamin B12, and vitamin D 2 tablespoons walnuts, unsalted 1 banana 1 cup orange juice with added calcium and vitamin D  Morning Snack  cup almond butter, unsalted 1 apple  cup granola  cup soy yogurt  Lunch 1 black bean burger, low sodium 1 hamburger bun 1 cup lettuce for salad with:  cup cucumbers, sliced  cup carrots, shredded 1 tablespoon cashews, unsalted 1 tablespoon sesame seed dressing, low sodium  cup pineapple  Afternoon Snack 1 pita, whole wheat  cup hummus  cup grapes  Evening Meal 1 cup cooked rice 1 cup red beans, cooked 1 cup corn, cooked  cup cucumber slices  cup  tomato, diced  Evening Snack Smoothie made with: 1 cup soymilk fortified with calcium, vitamin B12, and vitamin D 1 scoop soy protein powder, for smoothie 1 cup strawberries, for smoothie  Daily Sum Nutrient Unit Value  Macronutrients  Energy kcal 2831  Energy kJ 11850  Protein g 121  Total lipid (fat) g 85  Carbohydrate, by difference g 428  Fiber, total dietary g 71  Sugars, total g 154  Minerals  Calcium, Ca mg 2162  Iron, Fe mg 45  Sodium, Na mg 2054  Vitamins  Vitamin C, total ascorbic acid mg 296  Vitamin A, IU IU 17869  Vitamin D IU 318  Lipids  Fatty acids, total saturated g 9  Fatty acids, total monounsaturated g 36  Fatty acids, total polyunsaturated g 30  Cholesterol mg 0     Heart Failure (Undernourished) Vegetarian (Lacto-Ovo) Sample 1-Day Menu View Nutrient Info Breakfast 1 cup oatmeal made with: 1 cup 2% milk 1 slice toast, whole wheat topped with:  cup   avocado 1 cup orange juice with added calcium and vitamin D  Morning Snack  cup almonds, unsalted 2 tablespoons dried cranberries (add to almonds) 1 cup Greek yogurt, plain  Lunch Sandwich made with: 2 slices bread, whole wheat 2 tablespoons peanut butter, unsalted 1 tablespoon jam 4 carrot sticks with: 2 tablespoons hummus 1 banana  Afternoon Snack  cup granola  cup peanuts, unsalted 1 apple  Evening Meal 1 cup black bean soup, low sodium  cup tofu, cooked  cup cooked brown rice  cup broccoli, cooked  cup carrots, cooked  cup onions, cooked 2 tablespoons peanut oil  Evening Snack 5 crackers, whole wheat, low sodium 3 dried figs 1 cup 2% milk  Daily Sum Nutrient Unit Value  Macronutrients  Energy kcal 3020  Energy kJ 12636  Protein g 117  Total lipid (fat) g 133  Carbohydrate, by difference g 375  Fiber, total dietary g 58  Sugars, total g 148  Minerals  Calcium, Ca mg 2197  Iron, Fe mg 29  Sodium, Na mg 1434  Vitamins  Vitamin C, total ascorbic acid mg 174  Vitamin A, IU  IU 20295  Vitamin D IU 317  Lipids  Fatty acids, total saturated g 27  Fatty acids, total monounsaturated g 62  Fatty acids, total polyunsaturated g 34  Cholesterol mg 63    Copyright 2020  [employer]. All rights reserved  

## 2023-12-03 NOTE — Evaluation (Signed)
 Occupational Therapy Evaluation Patient Details Name: Guy Espinoza MRN: [id number] DOB: [birth year] Today's Date: 12/03/2023   History of Present Illness   Pt is a 89 y.o. male admitted 9/5 for SOB. CT showed bil pleural effusion & pulmonary nodule.  PMH: HTN, asthma, pacemaker, afib, CAD, CHF, AV block     Clinical Impressions Pt admitted based on above, and was seen based on problem list below. PTA pt was living at home with wife reporting mod I for ADLs with use of rollator for mobility. Today pt is requiring set up  to min  assist for ADLs. Functional transfers are  min assist with RW. During initial mobility ~30ft pt with x2 posterior LOB requiring mod assist to correct. With seated rest break, pt able to mobilize ~13ft and complete standing ADLs at sink with CGA to min assist for balance, but no LOB. Currently, pt is limited by decreased strength, balance, and activity tolerance. Anticipate pt will progress well with continued post acute HHOT. OT will continue to follow acutely to maximize functional independence.        If plan is discharge home, recommend the following:   A little help with walking and/or transfers;A little help with bathing/dressing/bathroom;Assistance with cooking/housework     Functional Status Assessment   Patient has had a recent decline in their functional status and demonstrates the ability to make significant improvements in function in a reasonable and predictable amount of time.     Equipment Recommendations   Tub/shower seat;Other (comment) (RW)      Precautions/Restrictions   Precautions Precautions: Fall Recall of Precautions/Restrictions: Intact Restrictions Weight Bearing Restrictions Per Provider Order: No     Mobility Bed Mobility Overal bed mobility: Needs Assistance Bed Mobility: Supine to Sit, Sit to Supine     Supine to sit: Supervision Sit to supine: Supervision   General bed mobility comments: Increased time s for  safety with lines    Transfers Overall transfer level: Needs assistance Equipment used: Rolling walker (2 wheels) Transfers: Sit to/from Stand Sit to Stand: Min assist           General transfer comment: CGA to rise, min assist to steady, while mobilizing in room x2 LOB during ~34ft up to mod A to steady. After seated rest break ~24ft no LOB, but min assist to steady      Balance Overall balance assessment: Needs assistance Sitting-balance support: No upper extremity supported, Feet supported Sitting balance-Leahy Scale: Fair   Postural control: Posterior lean Standing balance support: Bilateral upper extremity supported, During functional activity, Reliant on assistive device for balance Standing balance-Leahy Scale: Poor Standing balance comment: Reliant on RW, and therapist assist to balance       ADL either performed or assessed with clinical judgement   ADL Overall ADL's : Needs assistance/impaired Eating/Feeding: Set up;Sitting   Grooming: Oral care;Wash/dry face;Minimal assistance;Standing Grooming Details (indicate cue type and reason): Min assist to maintain upright in standing         Upper Body Dressing : Set up;Sitting   Lower Body Dressing: Minimal assistance;Sit to/from stand Lower Body Dressing Details (indicate cue type and reason): Min assist to steady in standing, pt able to don socks Toilet Transfer: Minimal assistance;Ambulation;Rolling walker (2 wheels) Toilet Transfer Details (indicate cue type and reason): Min assist for balance Toileting- Clothing Manipulation and Hygiene: Maximal assistance;Sit to/from stand Toileting - Clothing Manipulation Details (indicate cue type and reason): Assist for balance     Functional mobility during ADLs: Minimal assistance;Rolling walker (  2 wheels) General ADL Comments: Poor activity tolerance and balance even with BUE support     Vision Baseline Vision/History: 1 Wears glasses Patient Visual Report: No  change from baseline Vision Assessment?: No apparent visual deficits            Pertinent Vitals/Pain Pain Assessment Pain Assessment: No/denies pain     Extremity/Trunk Assessment Upper Extremity Assessment Upper Extremity Assessment: Generalized weakness   Lower Extremity Assessment Lower Extremity Assessment: Defer to PT evaluation   Cervical / Trunk Assessment Cervical / Trunk Assessment: Kyphotic   Communication Communication Communication: No apparent difficulties Factors Affecting Communication: Hearing impaired   Cognition Arousal: Alert Behavior During Therapy: WFL for tasks assessed/performed Cognition: No apparent impairments       Following commands: Intact       Cueing  General Comments   Cueing Techniques: Verbal cues  Session conducted on 3L , O2 stats >94%, left resting on 2L at 99%           Home Living Family/patient expects to be discharged to:: Private residence Living Arrangements: Spouse/significant other Available Help at Discharge: Family Type of Home: House Home Access: Stairs to enter Secretary/administrator of Steps: 4 Entrance Stairs-Rails: Right;Left Home Layout: One level     Bathroom Shower/Tub: Producer, television/film/video: Handicapped height Bathroom Accessibility: Yes How Accessible: Accessible via walker Home Equipment: Rollator (4 wheels);Cane - single point;Grab bars - tub/shower          Prior Functioning/Environment Prior Level of Function : Independent/Modified Independent   Mobility Comments: Mod I rollator, no falls in 6 months ADLs Comments: Mod I for basic ADLs    OT Problem List: Decreased strength;Decreased range of motion;Decreased activity tolerance;Impaired balance (sitting and/or standing);Decreased safety awareness;Cardiopulmonary status limiting activity   OT Treatment/Interventions: Therapeutic exercise;Self-care/ADL training;Energy conservation;DME and/or AE instruction;Therapeutic  activities;Patient/family education;Balance training      OT Goals(Current goals can be found in the care plan section)   Acute Rehab OT Goals Patient Stated Goal: To go home OT Goal Formulation: With patient Time For Goal Achievement: 12/17/23 Potential to Achieve Goals: Good   OT Frequency:  Min 2X/week       AM-PAC OT 6 Clicks Daily Activity     Outcome Measure Help from another person eating meals?: None Help from another person taking care of personal grooming?: A Little Help from another person toileting, which includes using toliet, bedpan, or urinal?: A Little Help from another person bathing (including washing, rinsing, drying)?: A Little Help from another person to put on and taking off regular upper body clothing?: A Little Help from another person to put on and taking off regular lower body clothing?: A Little 6 Click Score: 19   End of Session Equipment Utilized During Treatment: Rolling walker (2 wheels);Gait belt Nurse Communication: Mobility status  Activity Tolerance: Patient tolerated treatment well Patient left: in bed;with call bell/phone within reach;with bed alarm set  OT Visit Diagnosis: Unsteadiness on feet (R26.81);Other abnormalities of gait and mobility (R26.89);Muscle weakness (generalized) (M62.81)                Time: 8747-8678 OT Time Calculation (min): 29 min Charges:  OT General Charges $OT Visit: 1 Visit OT Evaluation $OT Eval Moderate Complexity: 1 Mod OT Treatments $Self Care/Home Management : 8-22 mins  Adrianne BROCKS, OT  Acute Rehabilitation Services Office [phone] Secure chat preferred   Adrianne GORMAN Savers 12/03/2023, 2:29 PM

## 2023-12-03 NOTE — Evaluation (Signed)
 Physical Therapy Evaluation Patient Details Name: Guy Espinoza MRN: [id number] DOB: 08/25/1926 Today's Date: 12/03/2023  History of Present Illness  Pt is a 89 y.o. male admitted 9/5 for SOB. CT showed bil pleural effusion & pulmonary nodule.  PMH: HTN, asthma, pacemaker, afib, CAD, CHF, AV block  Clinical Impression  Pt is presenting below baseline level of functioning at this time. Mod I for bed mobility, Min A for sit to stand with RW and CGA for short in-home distance gait. Pt was able to ambulate with O2 sats 88% and above but desaturated in sitting after gait to 86%; quickly up to over 90% with 2L O2 via [place]. Due to pt current functional status, home set up and available assistance at home recommending skilled physical therapy services 3x/week in order to address strength, balance and functional mobility to decrease risk for falls, injury and re-hospitalization.           If plan is discharge home, recommend the following: A little help with walking and/or transfers;Help with stairs or ramp for entrance;Assist for transportation;Assistance with cooking/housework     Equipment Recommendations None recommended by PT     Functional Status Assessment Patient has had a recent decline in their functional status and demonstrates the ability to make significant improvements in function in a reasonable and predictable amount of time.     Precautions / Restrictions Precautions Precautions: Fall Recall of Precautions/Restrictions: Intact Restrictions Weight Bearing Restrictions Per Provider Order: No      Mobility  Bed Mobility Overal bed mobility: Modified Independent Bed Mobility: Supine to Sit, Sit to Supine     Supine to sit: Modified independent (Device/Increase time) Sit to supine: Modified independent (Device/Increase time)   General bed mobility comments: Increased time    Transfers Overall transfer level: Needs assistance Equipment used: Rolling walker (2  wheels) Transfers: Sit to/from Stand Sit to Stand: Min assist           General transfer comment: Min A to get to standing with some instability at bil knees and increased time to get fully into extension. CGA for steadying once fully in standing.    Ambulation/Gait Ambulation/Gait assistance: Contact guard assist Gait Distance (Feet): 60 Feet Assistive device: Rolling walker (2 wheels) Gait Pattern/deviations: Step-through pattern, Decreased stride length, Trunk flexed Gait velocity: decreased Gait velocity interpretation: <1.31 ft/sec, indicative of household ambulator   General Gait Details: Kyphotic posture with decreased step length with reciprocal gait pattern. Pt on room air with O2 sats remaining above 88% during gait. After gait dropped to 86% and back up to 94% after ~ 30 seconds on 2L O2 via [place]     Balance Overall balance assessment: Needs assistance Sitting-balance support: No upper extremity supported, Feet supported Sitting balance-Leahy Scale: Good     Standing balance support: Bilateral upper extremity supported, During functional activity, Reliant on assistive device for balance Standing balance-Leahy Scale: Fair Standing balance comment: no overt LOB       Pertinent Vitals/Pain Pain Assessment Pain Assessment: No/denies pain    Home Living Family/patient expects to be discharged to:: Private residence Living Arrangements: Spouse/significant other Available Help at Discharge: Family Type of Home: House Home Access: Stairs to enter Entrance Stairs-Rails: Doctor, general practice of Steps: 4   Home Layout: One level Home Equipment: Rollator (4 wheels);Cane - single point;Grab bars - tub/shower      Prior Function Prior Level of Function : Independent/Modified Independent  Mobility Comments: Mod I rollator, no falls in 6 months ADLs Comments: Mod I for basic ADLs     Extremity/Trunk Assessment   Upper Extremity  Assessment Upper Extremity Assessment: Defer to OT evaluation    Lower Extremity Assessment Lower Extremity Assessment: Generalized weakness    Cervical / Trunk Assessment Cervical / Trunk Assessment: Kyphotic  Communication   Communication Communication: Impaired Factors Affecting Communication: Hearing impaired    Cognition Arousal: Alert Behavior During Therapy: WFL for tasks assessed/performed   PT - Cognitive impairments: No apparent impairments     Following commands: Intact       Cueing Cueing Techniques: Verbal cues     General Comments General comments (skin integrity, edema, etc.): O2 sats 97% on 2L O2 via [place]. Pt able to ambulate at 88% and above on room air but dropped to 86% after gait. Pt HR remained WNL throughout session        Assessment/Plan    PT Assessment Patient needs continued PT services  PT Problem List Decreased strength;Decreased activity tolerance;Decreased balance;Decreased mobility;Cardiopulmonary status limiting activity       PT Treatment Interventions DME instruction;Balance training;Gait training;Stair training;Functional mobility training;Therapeutic activities;Therapeutic exercise;Patient/family education    PT Goals (Current goals can be found in the Care Plan section)  Acute Rehab PT Goals Patient Stated Goal: to improve mobility PT Goal Formulation: With patient Time For Goal Achievement: 12/17/23 Potential to Achieve Goals: Good    Frequency Min 2X/week        AM-PAC PT 6 Clicks Mobility  Outcome Measure Help needed turning from your back to your side while in a flat bed without using bedrails?: None Help needed moving from lying on your back to sitting on the side of a flat bed without using bedrails?: None Help needed moving to and from a bed to a chair (including a wheelchair)?: A Little Help needed standing up from a chair using your arms (e.g., wheelchair or bedside chair)?: A Little Help needed to walk in  hospital room?: A Little Help needed climbing 3-5 steps with a railing? : A Little 6 Click Score: 20    End of Session Equipment Utilized During Treatment: Gait belt;Oxygen Activity Tolerance: Patient tolerated treatment well Patient left: in bed;with call bell/phone within reach;with bed alarm set Nurse Communication: Mobility status PT Visit Diagnosis: Unsteadiness on feet (R26.81);Other abnormalities of gait and mobility (R26.89);Muscle weakness (generalized) (M62.81)    Time: 8467-8454 PT Time Calculation (min) (ACUTE ONLY): 13 min   Charges:   PT Evaluation $PT Eval Low Complexity: 1 Low   PT General Charges $$ ACUTE PT VISIT: 1 Visit    Dorothyann Maier, DPT, CLT  Acute Rehabilitation Services Office: [phone] (Secure chat preferred)   Dorothyann VEAR Maier 12/03/2023, 3:54 PM

## 2023-12-03 NOTE — Progress Notes (Signed)
 Progress Note   Patient: Guy Espinoza FMW:[id number] DOB: 22-Jun-1926 DOA: 12/01/2023     1 DOS: the patient was seen and examined on 12/03/2023   Brief hospital course: Mr. Latulippe was admitted to the hospital with the working diagnosis of heart failure exacerbation.   89 yo male with the past medical history of atrial fibrillation, hypertension, coronary artery disease and pulmonary hypertension, who presented with dyspnea.  Reported about 1 to 2 weeks of dyspnea on exertion, denied any chest pain, edema or palpitations. Positive cough and increased sputum production. On the day of admission he was evaluated at the EP clinic and was found hypoxemic, 87 to 88% on ambient air along with trace edema at his lower extremities. He was referred to the ED for further evaluation.  On his initial physical examination his blood pressure was 130/85, HR 61, RR 26 and 02 saturation 99% on supplemental 02 per [place]. Lungs with decreased breath sounds with no wheezing, heart with S1 and S2 present, irregularly irregular with no gallops or rubs, abdomen with no distention and positive lower extremity edema.   VBG 7,48/ 38.3/ 37/ 28/ 75%  Na 147, K 3.8 Cl 107 bicarbonate 27, bun 125, glucose 125 bun 30 cr 0,94   Chest radiograph with hyperinflation, positive cardiomegaly, mild hilar vascular congestion, pacemaker in place with one lead in the right atrium and one in the right ventricle.   CT chest with centrilobular emphysema, small bilateral pleural effusions, with bilateral basal atelectasis.  Stable 7 mm right upper lobe peri fissural subpleural pulmonary nodule, mild cardiomegaly.    EKG 71 bpm, left axis deviation, left bundle branch block, qtc 508, atrial flutter with ventricular paced rhythm, no significant ST segment or T wave changes.   Assessment and Plan: * Acute on chronic systolic CHF (congestive heart failure) (HCC) Echocardiogram with reduced LV systolic function with EF 35 to 40%, global hypokinesis,  mild dilatated LV cavity, grade II diastolic dysfunction (pseudo-normalization for E/A), RV systolic function preserved, RV with mild enlargement, RVSP 72.8 mmHg,  Severe dilatation right and left atrium, moderate to severe mitral valve regurgitation, moderate tricuspid regurgitation, no aortic stenosis. Trivial pericardial effusion.   Acute on chronic cor pulmonale Pulmonary hypertension   Improved volume status Urine output is 1400 ml  Systolic blood pressure 114 mmHg.   Hold PM dose of IV furosemide   Continue with SGLT 2 inh, spironolactone , losartan  and metoprolol  succinate.   Acute hypoxemic respiratory failure due to acute cardiogenic pulmonary edema, and COPD exacerbation. Plan to continue diuresis and bronchodilator therapy along with steroids Today 02 saturation is 97% on 3 L/min  Patient may need supplemental 02 at home.  Check ambulatory oxymetry on room air.   Essential hypertension Continue blood pressure control with losartan  and metoprolol .   Coronary artery disease involving native coronary artery of native heart Continue blood pressure control with losartan  and metoprolol  succinate.   Atrial fibrillation, chronic (HCC) Continue metoprolol  succinate for rate control. Continue anticoagulation with apixaban .  Continue telemetry monitoring   Hypernatremia Improved volume status, renal function today with serum cr at 1,12 with K at 3,6 and serum bicarbonate at 30  Na 144 and Mg 2.2   Continue with SGLT 2 inh and spironolactone  Follow up renal function and electrolytes in am.   Spinal stenosis Pt and OT.   COPD with acute exacerbation (HCC) Continue systemic corticosteroids with prednisone  40 mg po daily.  Bronchodilator therapy and inhaled corticosteroids.  Continue oxymetry monitoring and supplemental 02 per  [place].   Pure hypercholesterolemia Continue statin therapy     Subjective: Patient with improvement in dyspnea and right knee pain, continue very weak  and deconditioned.   Physical Exam: Vitals:   12/02/23 1136 12/02/23 1525 12/02/23 2005 12/02/23 2300  BP: 111/78 120/77  (!) 125/113  Pulse: 71 78  66  Resp: 20   20  Temp: 97.7 F (36.5 C) 97.6 F (36.4 C)  97.9 F (36.6 C)  TempSrc: Oral Oral  Oral  SpO2: 98% 97% 97% 95%  Weight:      Height:       Neurology awake and alert, deconditioned ENT with mild pallor Cardiovascular with S1 and S2 present, and regular with no gallops or rubs, positive systolic murmur at the apex.  No JVD Respiratory with prolonged expiratory phase with bilateral rhonchi, scattered rales and wheezing Abdomen with no distention No lower extremity edema   Data Reviewed:    Family Communication: I spoke with patient's wife at the bedside, we talked in detail about patient's condition, plan of care and prognosis and all questions were addressed.   Disposition: Status is: Inpatient Remains inpatient appropriate because: recovering heart failure and COPD   Planned Discharge Destination: Home     Author: Elidia Toribio Furnace, MD 12/03/2023 6:52 AM  For on call review [email or web address].

## 2023-12-04 ENCOUNTER — Inpatient Hospital Stay (HOSPITAL_COMMUNITY)

## 2023-12-04 ENCOUNTER — Telehealth: Payer: Self-pay | Admitting: Internal Medicine

## 2023-12-04 DIAGNOSIS — I482 Chronic atrial fibrillation, unspecified: Secondary | ICD-10-CM | POA: Diagnosis not present

## 2023-12-04 DIAGNOSIS — I5023 Acute on chronic systolic (congestive) heart failure: Secondary | ICD-10-CM | POA: Diagnosis not present

## 2023-12-04 DIAGNOSIS — I2510 Atherosclerotic heart disease of native coronary artery without angina pectoris: Secondary | ICD-10-CM | POA: Diagnosis not present

## 2023-12-04 DIAGNOSIS — I10 Essential (primary) hypertension: Secondary | ICD-10-CM | POA: Diagnosis not present

## 2023-12-04 NOTE — Telephone Encounter (Signed)
 Patient's wife walked in today stating her Husband Anoop) is admitted to the Hospital and not sure how ling he is going to be in there and is cancelling the Appointment to see Loni on Wednesday 12/06/2023.  Thank you.

## 2023-12-04 NOTE — Plan of Care (Addendum)
  Problem: Education: Goal: Knowledge of General Education information will improve Description: Including pain rating scale, medication(s)/side effects and non-pharmacologic comfort measures Outcome: Progressing   Problem: Health Behavior/Discharge Planning: Goal: Ability to manage health-related needs will improve Outcome: Progressing   Problem: Clinical Measurements: Goal: Ability to maintain clinical measurements within normal limits will improve Outcome: Progressing Goal: Will remain free from infection Outcome: Progressing Goal: Diagnostic test results will improve Outcome: Progressing Goal: Respiratory complications will improve Outcome: Progressing Goal: Cardiovascular complication will be avoided Outcome: Progressing   Problem: Coping: Goal: Level of anxiety will decrease Outcome: Progressing   Problem: Elimination: Goal: Will not experience complications related to bowel motility Outcome: Progressing Goal: Will not experience complications related to urinary retention Outcome: Progressing   Problem: Pain Managment: Goal: General experience of comfort will improve and/or be controlled Outcome: Progressing   Problem: Safety: Goal: Ability to remain free from injury will improve Outcome: Progressing   Problem: Skin Integrity: Goal: Risk for impaired skin integrity will decrease Outcome: Progressing   Problem: Activity: Goal: Risk for activity intolerance will decrease Outcome: Not Progressing- patient weak and not mobilizing well.   Problem: Nutrition: Goal: Adequate nutrition will be maintained Outcome: Not Progressing-patient able to eat small amounts, concern for swallowing?

## 2023-12-04 NOTE — Procedures (Signed)
 Modified Barium Swallow Study  Patient Details  Name: Guy Espinoza MRN: [id number] Date of Birth: 1926-06-05  Today's Date: 12/04/2023  Modified Barium Swallow completed.  Full report located under Chart Review in the Imaging Section.  History of Present Illness Patient is a 89 y.o. male with PMH: a-fib, HTN, asthma, pacemaker, pulmonary hypertension, severe CAD with multiple blockages per cath 07/2023. He presented to the hospital on 12/02/23 for SOB. CT showed bilateral pleural effusion and pulmonary nodule. SLP swallow evaluation ordered secondary to coughing with PO intake.   Clinical Impression Pt presents with a mild oral dysphagia, a moderate pharyngeal dysphagia, and concerns for an esophageal dysphagia (?primary source of dysphagia) per MBSS completed today. There was aspiration of thin and nectar-thick liquids. Improved airway protection observed with chin tuck with thin liquids.   Findings:  -There was scant, silent aspiration of thin liquids by spoon during/after the swallow and  frank, audible aspiration of thin liquids by straw before/during the swallow.  Pt with head in neutral position for both trials.  -Chin tuck was effective for aiding airway protection completely or reducing airway violation to scant, transient or stagnant shallow penetration of thin liquids by straw.  -There was scant, silent aspiration of nectar-thick liquid wash (post cracker trial) after the swallow. Pt's head was in neutral position. -No penetration or aspiration of nectar-thick liquids by cup.  -Shallow penetration without ejection of thin liquids by cup (head in neutral) observed.  -There was min-mod diffuse pharyngeal residue following thin, nectar-thick liquids and mod-large oropharyngeal residue following puree and cracker trials. Residue reduced with cued additional swallows x1-2.   Oral deficits characterized by reduced oral strength and lingual propulsion resulting in min-moderate oral residue of  pudding and solids post initial swallow. Residue eventually reduced with additional swallows.  Pharyngeal deficits characterized by reduced hyolaryngeal elevation/excursion, reduced base of tongue retraction, absent epiglottic inversion, reduced laryngeal vestibule closure, and reduced pharyngeal stripping.   Concerns for esophageal dysphagia included moderate rentention of pudding and solid trials in the mid-lower esophagus with some retrograde flow below the UES. Pudding residue did clear with a liquid wash; however, the cracker residue remained despite multiple liquid washes. Due to esophageal retention of solids, discussed diet modification to mechanical ground/altered with pt and wife.   Detailed diet recommendations outlined below. Please see follow up treatment note for further information pertaining to education and diet modification after discussion with pt and wife.    Factors that may increase risk of adverse event in presence of aspiration Noe [employer] Lianne 2021): Frail or deconditioned;Weak cough  Swallow Evaluation Recommendations Recommendations: PO diet PO Diet Recommendation: Dysphagia 2 (Finely chopped);Thin liquids (Level 0) Liquid Administration via: Straw (with chin tuck!) Medication Administration: Whole meds with puree Supervision: Full supervision/cueing for swallowing strategies Swallowing strategies  : Minimize environmental distractions;Slow rate;Small bites/sips;Follow solids with liquids;Multiple dry swallows after each bite/sip;Chin tuck Postural changes: Position pt fully upright for meals;Stay upright 30-60 min after meals Oral care recommendations: Oral care BID (2x/day) Recommended consults: Consider esophageal assessment      Peyton JINNY Rummer 12/04/2023,12:18 PM

## 2023-12-04 NOTE — Plan of Care (Signed)

## 2023-12-04 NOTE — TOC Initial Note (Signed)
 Transition of Care (TOC) - Initial/Assessment Note  Rayfield Gobble RN, BSN Inpatient Care Management Unit 4E- RN Case Manager See Treatment Team for direct phone #    Patient Details  Name: Guy Espinoza MRN: [id number] Date of Birth: 06-Jun-1926  Transition of Care [facility]) CM/SW Contact:    Gobble Rayfield Hurst, RN Phone Number: 12/04/2023, 4:28 PM  Clinical Narrative:                 Orders placed for HHRN/PT/OT and DME- home oxygen.   CM in to speak with pt (asleep) wife at bedside.   Discussed DME/HH needs with wife. Wife voiced that they have not used HH in past and do not have  a preference for DME provider.  List provided per Per CMS guidelines from [email or web address] website with star ratings (copy placed in shadow chart)- wife voiced she will defer to this writer to secure [facility] on pt's behalf based on higher star ratings and in-network with insurance.   Qualifying note for home 02 still pending-   CM will follow up in am for [facility] and DME referrals once qualify note placed for 02.     Expected Discharge Plan: Home w Home Health Services Barriers to Discharge: Continued Medical Work up   Patient Goals and CMS Choice Patient states their goals for this hospitalization and ongoing recovery are:: return home   Choice offered to / list presented to : Spouse      Expected Discharge Plan and Services   Discharge Planning Services: CM Consult Post Acute Care Choice: Home Health, Durable Medical Equipment Living arrangements for the past 2 months: Single Family Home                 DME Arranged: Oxygen         HH Arranged: PT, RN, OT          Prior Living Arrangements/Services Living arrangements for the past 2 months: Single Family Home Lives with:: Spouse Patient language and need for interpreter reviewed:: Yes Do you feel safe going back to the place where you live?: Yes      Need for Family Participation in Patient Care: Yes (Comment) Care giver support  system in place?: Yes (comment)   Criminal Activity/Legal Involvement Pertinent to Current Situation/Hospitalization: No - Comment as needed  Activities of Daily Living      Permission Sought/Granted Permission sought to share information with : Facility Industrial/product designer granted to share information with : Yes, Verbal Permission Granted     Permission granted to share info w AGENCY: HH/DME        Emotional Assessment Appearance:: Appears stated age     Orientation: : Oriented to Self, Oriented to Place, Oriented to  Time, Oriented to Situation Alcohol / Substance Use: Not Applicable Psych Involvement: No (comment)  Admission diagnosis:  Acute exacerbation of CHF (congestive heart failure) (HCC) [I50.9] Acute decompensated heart failure (HCC) [I50.9] Dyspnea, unspecified type [R06.00] Patient Active Problem List   Diagnosis Date Noted   Benign prostatic hyperplasia without urinary obstruction 12/02/2023   Disability of walking 12/02/2023   Essential hypertension 12/02/2023   Pure hypercholesterolemia 12/02/2023   Prediabetes 12/02/2023   Acute on chronic systolic CHF (congestive heart failure) (HCC) 12/02/2023   Atrial fibrillation, chronic (HCC) 12/02/2023   COPD with acute exacerbation (HCC) 12/02/2023   Hypernatremia 12/02/2023   AV block 08/24/2023   Coronary artery disease involving native coronary artery of native heart 08/24/2023   HFrEF (heart failure  with reduced ejection fraction) (HCC) 08/24/2023   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/08/2018   Spinal stenosis 09/15/2014   Claudication in peripheral vascular disease (HCC) 09/13/2013   PCP:  Nanci Senior, MD Pharmacy:   [id number] [address] [place], [facility] - [address] [place] [address] Phone: [phone] Fax: [phone]     Social Drivers of Health (SDOH) Social History: SDOH Screenings   Food Insecurity: No Food Insecurity (12/02/2023)  Housing: Low Risk  (12/02/2023)   Transportation Needs: No Transportation Needs (12/02/2023)  Utilities: Patient Declined (12/02/2023)  Social Connections: Unknown (12/02/2023)  Tobacco Use: Low Risk  (12/01/2023)   SDOH Interventions:     Readmission Risk Interventions     No data to display

## 2023-12-04 NOTE — Telephone Encounter (Signed)
 Called patient's wife left message on personal voice mail hope your husband is feeling better soon.I saw his appointment has been rescheduled with Josefa Beauvais NP 9/15 at 3:35 pm.Advised to call back if he needs to reschedule.

## 2023-12-04 NOTE — Progress Notes (Signed)
 Progress Note   Patient: Guy Espinoza FMW:[id number] DOB: 1926/06/29 DOA: 12/01/2023     2 DOS: the patient was seen and examined on 12/04/2023   Brief hospital course: Mr. Ulin was admitted to the hospital with the working diagnosis of heart failure exacerbation.   89 yo male with the past medical history of atrial fibrillation, hypertension, coronary artery disease and pulmonary hypertension, who presented with dyspnea.  Reported about 1 to 2 weeks of dyspnea on exertion, denied any chest pain, edema or palpitations. Positive cough and increased sputum production. On the day of admission he was evaluated at the EP clinic and was found hypoxemic, 87 to 88% on ambient air along with trace edema at his lower extremities. He was referred to the ED for further evaluation.  On his initial physical examination his blood pressure was 130/85, HR 61, RR 26 and 02 saturation 99% on supplemental 02 per [place]. Lungs with decreased breath sounds with no wheezing, heart with S1 and S2 present, irregularly irregular with no gallops or rubs, abdomen with no distention and positive lower extremity edema.   VBG 7,48/ 38.3/ 37/ 28/ 75%  Na 147, K 3.8 Cl 107 bicarbonate 27, bun 125, glucose 125 bun 30 cr 0,94   Chest radiograph with hyperinflation, positive cardiomegaly, mild hilar vascular congestion, pacemaker in place with one lead in the right atrium and one in the right ventricle.   CT chest with centrilobular emphysema, small bilateral pleural effusions, with bilateral basal atelectasis.  Stable 7 mm right upper lobe peri fissural subpleural pulmonary nodule, mild cardiomegaly.    EKG 71 bpm, left axis deviation, left bundle branch block, qtc 508, atrial flutter with ventricular paced rhythm, no significant ST segment or T wave changes.   Patient placed on diuresis, bronchodilator therapy and steroids.  09/08 clinically improving, will need supplemental 02 at home.   Assessment and Plan: * Acute on chronic  systolic CHF (congestive heart failure) (HCC) Echocardiogram with reduced LV systolic function with EF 35 to 40%, global hypokinesis, mild dilatated LV cavity, grade II diastolic dysfunction (pseudo-normalization for E/A), RV systolic function preserved, RV with mild enlargement, RVSP 72.8 mmHg,  Severe dilatation right and left atrium, moderate to severe mitral valve regurgitation, moderate tricuspid regurgitation, no aortic stenosis. Trivial pericardial effusion.   Acute on chronic cor pulmonale Pulmonary hypertension   Improved volume status Urine output is 2,900 ml  Systolic blood pressure 114 mmHg.   Continue with SGLT 2 inh, spironolactone , losartan  and metoprolol  succinate.  Resume loop diuretic in the next 24 hrs   Acute hypoxemic respiratory failure due to acute cardiogenic pulmonary edema, and COPD exacerbation.  09/08 follow up radiograph with persistent right sided pleural effusion with basilar atelectasis.   Continue with bronchodilator therapy along with steroids Add airway clearing techniques with flutter valve and incentive spirometer.   Essential hypertension Continue blood pressure control with losartan  and metoprolol .   Coronary artery disease involving native coronary artery of native heart Continue blood pressure control with losartan  and metoprolol  succinate.   Atrial fibrillation, chronic (HCC) Continue metoprolol  succinate for rate control. Continue anticoagulation with apixaban .  Continue telemetry monitoring   Hypernatremia Renal function stable with serum cr at 1,1 with K at 3,6 and serum bicarbonate at 30  Na 144 and Mg 2.2   Continue with SGLT 2 inh and spironolactone  Follow up renal function and electrolytes in am.   Spinal stenosis Pt and OT.   COPD with acute exacerbation (HCC) Continue systemic corticosteroids with  prednisone  40 mg po daily.  Bronchodilator therapy and inhaled corticosteroids.  Continue oxymetry monitoring and supplemental  02 per Ignacio.  Add airway clearing techniques with flutter valve and incentive spirometer.   Pure hypercholesterolemia Continue statin therapy      Subjective: Patient reports feeling better, with improvement in dyspnea, no chest pain, no PND or orthopnea. Per nursing he had more work of breathing   Physical Exam: Vitals:   12/03/23 2110 12/04/23 0614 12/04/23 0700 12/04/23 0840  BP: 115/77   124/86  Pulse: 70   70  Resp:   18 18  Temp: 98 F (36.7 C)   97.7 F (36.5 C)  TempSrc: Oral   Oral  SpO2: 93%   97%  Weight:  50.1 kg    Height:       Neurology awake and alert ENT with mild pallor Cardiovascular with S1 and S2 present and regular with no gallops, or rubs, positive systolic murmur at the right lower sternal border and apex.  No JVD Respiratory with bilateral rhonchi with no wheezing or frank rales, has prolonged expiratory phase Abdomen with no distention No lower extremity edema   Data Reviewed:    Family Communication: no family at the bedside   Disposition: Status is: Inpatient Remains inpatient appropriate because: recovering heart failure   Planned Discharge Destination: Home     Author: Elidia Toribio Furnace, MD 12/04/2023 10:12 AM  For on call review [email or web address].

## 2023-12-04 NOTE — Progress Notes (Signed)
 Mobility Specialist Progress Note:   12/04/23 0850  Mobility  Activity Ambulated with assistance  Level of Assistance Contact guard assist, steadying assist  Assistive Device Front wheel walker  Distance Ambulated (ft) 100 ft  Activity Response Tolerated well  Mobility Referral Yes  Mobility visit 1 Mobility  Mobility Specialist Start Time (ACUTE ONLY) 0850  Mobility Specialist Stop Time (ACUTE ONLY) 0905  Mobility Specialist Time Calculation (min) (ACUTE ONLY) 15 min   Pt agreeable to mobility session. Required only contact assist for safety. Still requiring 2LO2 to maintain SpO2 WFL. No overt LOB noted. Pt back in bed with all needs met, alarm on.   Therisa Rana Mobility Specialist Please contact via SecureChat or  Rehab office at [phone]

## 2023-12-04 NOTE — Progress Notes (Signed)
 Heart Failure Navigator Progress Note  Assessed for Heart & Vascular TOC clinic readiness.  Patient does not meet criteria due to No TOC per Dr. Arrien. Has a scheduled [facility] appointment on 12/11/2023.   Navigator will sign off at this time.   Stephane Haddock, BSN, Scientist, clinical (histocompatibility and immunogenetics) Only

## 2023-12-04 NOTE — Progress Notes (Signed)
 RT instructed patient on to use flutter valve.

## 2023-12-04 NOTE — Plan of Care (Signed)
  Problem: SLP Dysphagia Goals Goal: Misc Dysphagia Goal 12/04/2023 1201 by Henry Peyton PARAS, CCC-SLP Outcome: Completed/Met 12/04/2023 1201 by Henry Peyton PARAS, CCC-SLP Flowsheets (Taken 12/03/2023 1706 by Bonnie Norleen DASEN, CCC-SLP) Misc Dysphagia Goal: Patient will participate in objective swallow study (MBS).

## 2023-12-04 NOTE — Progress Notes (Signed)
 Speech Language Pathology Treatment: Dysphagia  Patient Details Name: Guy Espinoza MRN: [id number] DOB: [birth year] Today's Date: 12/04/2023 Time: 8871-8850 SLP Time Calculation (min) (ACUTE ONLY): 21 min  Assessment / Plan / Recommendation Clinical Impression  SLPat bedside to review MBSS results and recommendations with pt and wife. Reviewed MBSS images. Modeled and discussed compensatory swallow strategies and diet modifications. Written handout provided. Discussed D3 or D2 diet may help with esophageal clearance and reduce risk of backflow. Pt opted for D2 diet, though may be able to advance as tolerated. Strict recommendation to use chin tuck with thin liquids + dry swallow x1-2 to reduce residue. Pt demonstrated this strategy with thin by straw given mod cues to tuck chin down further and keep chin down until done swallowing.   SLP will follow up to assess diet tolerance and reinforce compensatory swallow strategies.    HPI HPI: Patient is a 89 y.o. male with PMH: a-fib, HTN, asthma, pacemaker, pulmonary hypertension, severe CAD with multiple blockages per cath 07/2023. He presented to the hospital on 12/02/23 for SOB. CT showed bilateral pleural effusion and pulmonary nodule. SLP swallow evaluation ordered secondary to coughing with PO intake.      SLP Plan  Continue with current plan of care          Recommendations  Diet recommendations: Dysphagia 2 (fine chop);Thin liquid Liquids provided via: Straw (with chin tuck) Medication Administration: Whole meds with liquid Supervision: Full supervision/cueing for compensatory strategies Compensations: Slow rate;Small sips/bites;Chin tuck;Multiple dry swallows after each bite/sip Postural Changes and/or Swallow Maneuvers: Seated upright 90 degrees;Upright 30-60 min after meal                  Oral care BID     Dysphagia, oropharyngeal phase (R13.12)     Continue with current plan of care     Peyton JINNY Rummer  12/04/2023,  12:51 PM

## 2023-12-05 ENCOUNTER — Inpatient Hospital Stay (HOSPITAL_COMMUNITY)

## 2023-12-05 DIAGNOSIS — I10 Essential (primary) hypertension: Secondary | ICD-10-CM | POA: Diagnosis not present

## 2023-12-05 DIAGNOSIS — I2510 Atherosclerotic heart disease of native coronary artery without angina pectoris: Secondary | ICD-10-CM | POA: Diagnosis not present

## 2023-12-05 DIAGNOSIS — E43 Unspecified severe protein-calorie malnutrition: Secondary | ICD-10-CM

## 2023-12-05 DIAGNOSIS — I5023 Acute on chronic systolic (congestive) heart failure: Secondary | ICD-10-CM | POA: Diagnosis not present

## 2023-12-05 DIAGNOSIS — I482 Chronic atrial fibrillation, unspecified: Secondary | ICD-10-CM | POA: Diagnosis not present

## 2023-12-05 LAB — BASIC METABOLIC PANEL WITH GFR
Anion gap: 7 (ref 5–15)
BUN: 41 mg/dL — ABNORMAL HIGH (ref 8–23)
CO2: 31 mmol/L (ref 22–32)
Calcium: 8.3 mg/dL — ABNORMAL LOW (ref 8.9–10.3)
Chloride: 102 mmol/L (ref 98–111)
Creatinine, Ser: 1.13 mg/dL (ref 0.61–1.24)
GFR, Estimated: 60 mL/min — ABNORMAL LOW (ref >60)
Glucose, Bld: 99 mg/dL (ref 70–99)
Potassium: 4 mmol/L (ref 3.5–5.1)
Sodium: 140 mmol/L (ref 135–145)

## 2023-12-05 MED ORDER — SODIUM CHLORIDE 0.9 % IV SOLN
1.5000 g | Freq: Two times a day (BID) | INTRAVENOUS | Status: DC
  Administered 2023-12-05 – 2023-12-08 (×6): 1.5 g via INTRAVENOUS
  Filled 2023-12-05 (×9): qty 4

## 2023-12-05 MED ORDER — IPRATROPIUM-ALBUTEROL 0.5-2.5 (3) MG/3ML IN SOLN: 3.0000 mL | Freq: Four times a day (QID) | RESPIRATORY_TRACT | Status: DC

## 2023-12-05 MED ORDER — ALBUTEROL SULFATE (2.5 MG/3ML) 0.083% IN NEBU
2.5000 mg | INHALATION_SOLUTION | RESPIRATORY_TRACT | Status: DC | PRN
  Administered 2023-12-09: 2.5 mg via RESPIRATORY_TRACT
  Filled 2023-12-05: qty 3

## 2023-12-05 NOTE — Progress Notes (Addendum)
 Nutrition Follow-up  DOCUMENTATION CODES:   Severe malnutrition in context of chronic illness, Underweight (CHF, dysphagia)  INTERVENTION:  Ensure Plus High Protein po BID, each supplement provides 350 kcal and 20 grams of protein.  Magic cup TID with meals, each supplement provides 290 kcal and 9 grams of protein  Encouraged adequate intake of meals and supplements to meet calorie and protein needs  Recommend bowel regimen to help soften stools   NUTRITION DIAGNOSIS:   Severe Malnutrition related to chronic illness (CHF, dysphagia) as evidenced by severe fat depletion, severe muscle depletion. New diagnosis   GOAL:   Patient will meet greater than or equal to 90% of their needs Progressing  MONITOR:   PO intake, Supplement acceptance  REASON FOR ASSESSMENT:   Consult Assessment of nutrition requirement/status  ASSESSMENT:   Pt with medical history significant for atrial fibrillation on Eliquis , hypertension, pacemaker, CAD, pulmonary hypertension, and severe coronary artery disease with multiple blockages per cath 07/2023.  He is currently under medical management.  9/5 admitted 9/8 MBS, SLP recommends DYS 2   Spoke with MD and SLP about pt's diet order as SLP recommended DYS 2 after MBS yesterday but diet had not been adjusted, MD adjusted order.  Spoke with pt who was eating breakfast in bed at time of assessment. Pt unable to answer most questions as he was dealing with some labored breathing. He reported good appetite while admitted, diet summary documentation shows 63% average intake x 2 recorded meals. Observed pt eating 100% of breakfast. Pt reports no GI discomforts at this time.   PTA, pt reports eating well and eating 3x per day. Pt shook his head no to whether he avoided any foods. Pt unable to provide details of diet hx or wt hx. No significant wt changes seen in chart review over last year, but pt has continually lost wt in last 5 years likely due to chronic  health issues related to CHF and aging.   Discussed continuing ONS Ensure and adding magic cups to trays, pt agreeable. Pt having hard stools, spoke with RN about getting bowel regimen started.  Average Meal Completion 9/8: 63% average intake x 2 recorded meals  Medications reviewed and include:  Jardiance  MVI w/ minerals  Labs reviewed  NUTRITION - FOCUSED PHYSICAL EXAM:  Flowsheet Row Most Recent Value  Orbital Region Severe depletion  Upper Arm Region Severe depletion  Thoracic and Lumbar Region Severe depletion  Buccal Region Severe depletion  Temple Region Severe depletion  Clavicle Bone Region Severe depletion  Clavicle and Acromion Bone Region Severe depletion  Scapular Bone Region Severe depletion  Dorsal Hand Severe depletion  Patellar Region Severe depletion  Anterior Thigh Region Severe depletion  Posterior Calf Region Severe depletion  Edema (RD Assessment) None  Hair Reviewed  Eyes Reviewed  Mouth Reviewed  Skin Reviewed  Nails Reviewed    Diet Order:   Diet Order             DIET DYS 2 Room service appropriate? Yes; Fluid consistency: Thin  Diet effective now                   EDUCATION NEEDS:   No education needs have been identified at this time  Skin:  Skin Assessment: Reviewed RN Assessment  Last BM:  9/9 type 1  Height:   Ht Readings from Last 1 Encounters:  12/01/23 5' 9 (1.753 m)    Weight:   Wt Readings from Last 1 Encounters:  12/05/23  49.4 kg    Ideal Body Weight:  72.7 kg  BMI:  Body mass index is 16.08 kg/m.  Estimated Nutritional Needs:   Kcal:  1600-1800  Protein:  80-95 grams  Fluid:  1.2 L    Josette Glance, MS, RDN, LDN Clinical Dietitian I Please reach out via secure chat

## 2023-12-05 NOTE — Progress Notes (Signed)
 Physical Therapy Treatment Patient Details Name: Guy Espinoza MRN: [id number] DOB: [birth year] Today's Date: 12/05/2023   History of Present Illness Pt is a 89 y.o. male admitted 9/5 for SOB. CT showed bil pleural effusion & pulmonary nodule.  PMH: HTN, asthma, pacemaker, afib, CAD, CHF, AV block    PT Comments  Making steady progress towards acute functional goals. Able to ambulate 160 feet this afternoon on 2L supplemental O2 at 93% SpO2 and greater, with moderate dyspnea. CGA for safety with RW for support, moderately antalgic pattern with decreased stance time on Lt. Required CGA for transfers this from bed x2 this afternoon, no physical assist to rise but a bit slow and effortful. Patient will continue to benefit from skilled physical therapy services to further improve independence with functional mobility.      If plan is discharge home, recommend the following: A little help with walking and/or transfers;Help with stairs or ramp for entrance;Assist for transportation;Assistance with cooking/housework   Can travel by private vehicle        Equipment Recommendations  None recommended by PT    Recommendations for Other Services       Precautions / Restrictions Precautions Precautions: Fall Recall of Precautions/Restrictions: Intact Restrictions Weight Bearing Restrictions Per Provider Order: No     Mobility  Bed Mobility Overal bed mobility: Modified Independent Bed Mobility: Supine to Sit     Supine to sit: Modified independent (Device/Increase time)     General bed mobility comments: Extra time, effortful but no physical assist.    Transfers Overall transfer level: Needs assistance Equipment used: Rolling walker (2 wheels) Transfers: Sit to/from Stand Sit to Stand: Contact guard assist           General transfer comment: CGA for safety to stand from bed x2. RW to steady upon rising. Cues for hand placement to reach back for arm rest on recliner prior to  sitting.    Ambulation/Gait Ambulation/Gait assistance: Contact guard assist Gait Distance (Feet): 160 Feet Assistive device: Rolling walker (2 wheels) Gait Pattern/deviations: Step-through pattern, Decreased stride length, Trunk flexed, Knee flexed in stance - left, Knees buckling, Antalgic Gait velocity: decreased     General Gait Details: SpO2 93% and greater on 2L supplemental O2 with moderate dyspnea while ambulating. CGA for safety without any overt LOB. Demonstrates antalgic pattern with slight buckling of Lt knee but able to self correct with light support from RW. Cues for safety, awareness, and proximity to device to maximize support as needed.   Stairs             Wheelchair Mobility     Tilt Bed    Modified Rankin (Stroke Patients Only)       Balance Overall balance assessment: Needs assistance Sitting-balance support: No upper extremity supported, Feet supported Sitting balance-Leahy Scale: Good     Standing balance support: Bilateral upper extremity supported, During functional activity, Reliant on assistive device for balance Standing balance-Leahy Scale: Poor Standing balance comment: UEs to steady                            Communication Communication Communication: Impaired Factors Affecting Communication: Hearing impaired  Cognition Arousal: Alert Behavior During Therapy: WFL for tasks assessed/performed   PT - Cognitive impairments: No apparent impairments                         Following commands: Intact  Cueing Cueing Techniques: Verbal cues  Exercises      General Comments General comments (skin integrity, edema, etc.): SpO2 93% on 2L with gait.      Pertinent Vitals/Pain Pain Assessment Pain Assessment: No/denies pain    Home Living                          Prior Function            PT Goals (current goals can now be found in the care plan section) Acute Rehab PT Goals Patient  Stated Goal: to improve mobility PT Goal Formulation: With patient Time For Goal Achievement: 12/17/23 Potential to Achieve Goals: Good Progress towards PT goals: Progressing toward goals    Frequency    Min 2X/week      PT Plan      Co-evaluation              AM-PAC PT 6 Clicks Mobility   Outcome Measure  Help needed turning from your back to your side while in a flat bed without using bedrails?: None Help needed moving from lying on your back to sitting on the side of a flat bed without using bedrails?: None Help needed moving to and from a bed to a chair (including a wheelchair)?: A Little Help needed standing up from a chair using your arms (e.g., wheelchair or bedside chair)?: A Little Help needed to walk in hospital room?: A Little Help needed climbing 3-5 steps with a railing? : A Little 6 Click Score: 20    End of Session Equipment Utilized During Treatment: Gait belt;Oxygen Activity Tolerance: Patient tolerated treatment well Patient left: with call bell/phone within reach;in chair;with chair alarm set Nurse Communication: Mobility status PT Visit Diagnosis: Unsteadiness on feet (R26.81);Other abnormalities of gait and mobility (R26.89);Muscle weakness (generalized) (M62.81);Difficulty in walking, not elsewhere classified (R26.2)     Time: 8441-8377 PT Time Calculation (min) (ACUTE ONLY): 24 min  Charges:    $Gait Training: 8-22 mins $Therapeutic Activity: 8-22 mins PT General Charges $$ ACUTE PT VISIT: 1 Visit                     Leontine Roads, PT, DPT [facility]  Rehabilitation Services Physical Therapist Office: [phone] Website: [email or web address]    Leontine GORMAN Roads 12/05/2023, 4:47 PM

## 2023-12-05 NOTE — Progress Notes (Signed)
 Speech Language Pathology Treatment: Dysphagia  Patient Details Name: Guy Espinoza MRN: [id number] DOB: 1926/10/16 Today's Date: 12/05/2023 Time: 8495-8479 SLP Time Calculation (min) (ACUTE ONLY): 16 min  Assessment / Plan / Recommendation Clinical Impression  Patient recalled and verbalized to SLP that he was taught chin tuck strategy. Patient recalled learning strategy with previous SLP. SLP observed patient's swallow via thin liquids. Patient needed cues and moderate assistance to perform the chin tuck strategy when drinking cranberry juice. No overt s/s aspiration observed with juice. SLP is recommending to continue on dys 2 diet with thin liquids using the chin tuck strategy. SLP will follow up and monitor progress.   HPI HPI: Patient is a 89 y.o. male with PMH: a-fib, HTN, asthma, pacemaker, pulmonary hypertension, severe CAD with multiple blockages per cath 07/2023. He presented to the hospital on 12/02/23 for SOB. CT showed bilateral pleural effusion and pulmonary nodule. SLP swallow evaluation ordered secondary to coughing with PO intake. SLP recommended chin tuck to manage aspiration.      SLP Plan  Continue with current plan of care          Recommendations  Diet recommendations: Dysphagia 2 (fine chop);Thin liquid Liquids provided via: Straw Medication Administration: Whole meds with liquid Supervision: Full supervision/cueing for compensatory strategies Compensations: Slow rate;Small sips/bites;Chin tuck;Multiple dry swallows after each bite/sip Postural Changes and/or Swallow Maneuvers: Seated upright 90 degrees;Upright 30-60 min after meal                  Oral care BID     Dysphagia, oropharyngeal phase (R13.12)     Continue with current plan of care    [name]  Graduate SLP Clinican

## 2023-12-05 NOTE — Progress Notes (Signed)
 Progress Note   Patient: Guy Espinoza FMW:[id number] DOB: 09/24/1926 DOA: 12/01/2023     3 DOS: the patient was seen and examined on 12/05/2023   Brief hospital course: Guy Espinoza was admitted to the hospital with the working diagnosis of heart failure exacerbation.   89 yo male with the past medical history of atrial fibrillation, hypertension, coronary artery disease and pulmonary hypertension, who presented with dyspnea.  Reported about 1 to 2 weeks of dyspnea on exertion, denied any chest pain, edema or palpitations. Positive cough and increased sputum production. On the day of admission he was evaluated at the EP clinic and was found hypoxemic, 87 to 88% on ambient air along with trace edema at his lower extremities. He was referred to the ED for further evaluation.  On his initial physical examination his blood pressure was 130/85, HR 61, RR 26 and 02 saturation 99% on supplemental 02 per [place]. Lungs with decreased breath sounds with no wheezing, heart with S1 and S2 present, irregularly irregular with no gallops or rubs, abdomen with no distention and positive lower extremity edema.   VBG 7,48/ 38.3/ 37/ 28/ 75%  Na 147, K 3.8 Cl 107 bicarbonate 27, bun 125, glucose 125 bun 30 cr 0,94   Chest radiograph with hyperinflation, positive cardiomegaly, mild hilar vascular congestion, pacemaker in place with one lead in the right atrium and one in the right ventricle.   CT chest with centrilobular emphysema, small bilateral pleural effusions, with bilateral basal atelectasis.  Stable 7 mm right upper lobe peri fissural subpleural pulmonary nodule, mild cardiomegaly.    EKG 71 bpm, left axis deviation, left bundle branch block, qtc 508, atrial flutter with ventricular paced rhythm, no significant ST segment or T wave changes.   Patient placed on diuresis, bronchodilator therapy and steroids.  09/08 clinically improving, will need supplemental 02 at home.  09/09 patient with worsening increased of  breathing and debility Chest radiograph with worsening right lower lobe infiltrate, likely aspiration pneumonia.   Assessment and Plan: * Acute on chronic systolic CHF (congestive heart failure) (HCC) Echocardiogram with reduced LV systolic function with EF 35 to 40%, global hypokinesis, mild dilatated LV cavity, grade II diastolic dysfunction (pseudo-normalization for E/A), RV systolic function preserved, RV with mild enlargement, RVSP 72.8 mmHg,  Severe dilatation right and left atrium, moderate to severe mitral valve regurgitation, moderate tricuspid regurgitation, no aortic stenosis. Trivial pericardial effusion.   Acute on chronic cor pulmonale Pulmonary hypertension   Improved volume status Urine output is 1,450 ml  Systolic blood pressure 114 mmHg.   Continue with spironolactone , losartan  and metoprolol  succinate.  Continue to hold on furosemide , stop SGLT 2 inh due to frailty   Acute hypoxemic respiratory failure due to acute cardiogenic pulmonary edema, and COPD exacerbation.  09/08 follow up radiograph with persistent right sided pleural effusion with basilar atelectasis.  09/09 persistent right lower lobe infiltrate.  Aspiration pneumonia not present on admission, start antibiotic therapy.  Continue with bronchodilator therapy along with steroids Continue with airway clearing techniques with flutter valve and incentive spirometer.   Essential hypertension Continue blood pressure control with losartan  and metoprolol .   Coronary artery disease involving native coronary artery of native heart Continue blood pressure control with losartan  and metoprolol  succinate.   Atrial fibrillation, chronic (HCC) Continue metoprolol  succinate for rate control. Continue anticoagulation with apixaban .  Continue telemetry monitoring   Hypernatremia Renal function with serum cr at 1,1 with K at 4.0 and serum bicarbonate at 31  Na 140  Continue with spironolactone  Follow up renal  function and electrolytes in am.   Spinal stenosis Pt and OT.   COPD with acute exacerbation (HCC) Continue systemic corticosteroids with prednisone  40 mg po daily.  Bronchodilator therapy and inhaled corticosteroids.  Continue oxymetry monitoring and supplemental 02 per [place].  Add airway clearing techniques with flutter valve and incentive spirometer.   Pure hypercholesterolemia Continue statin therapy  Protein-calorie malnutrition, severe Continue nutritional supplements.         Subjective: Patient very weak and deconditioned, worsening dyspnea, his wife is at the bedside   Physical Exam: Vitals:   12/05/23 0415 12/05/23 0833 12/05/23 1009 12/05/23 1118  BP: 117/81 115/80 112/80 111/62  Pulse: (!) 31 70    Resp: (!) 21 12    Temp: 98 F (36.7 C) (!) 97.1 F (36.2 C)    TempSrc: Oral Axillary    SpO2: 99% 98%    Weight: 49.4 kg     Height:       Neurology awake and alert, deconditioned ENT with mild pallor Cardiovascular with S1 and S2 present and regular with no gallops or rubs, positive systolic murmur at the right lower sternal border Respiratory with bilateral rales and rhonchi, poor inspiratory effort and positive use of accessory muscles Abdomen soft and not distended No lower extremity edema   Data Reviewed:    Family Communication: I spoke with patient's wife at the bedside, we talked in detail about patient's condition, plan of care and prognosis and all questions were addressed. Considering patient's worsening condition and frailty, poor prognosis, changed code status to DNR per patient and his wife    Disposition: Status is: Inpatient Remains inpatient appropriate because: pneumonia   Planned Discharge Destination: Skilled nursing facility    Author: Elidia Toribio Furnace, MD 12/05/2023 1:04 PM  For on call review [email or web address].

## 2023-12-05 NOTE — Progress Notes (Signed)
 Mobility Specialist Progress Note:   12/05/23 1150  Mobility  Activity Ambulated with assistance  Level of Assistance Minimal assist, patient does 75% or more  Assistive Device Front wheel walker  Distance Ambulated (ft) 30 ft  Activity Response Tolerated fair  Mobility Referral Yes  Mobility visit 1 Mobility  Mobility Specialist Start Time (ACUTE ONLY) 1130  Mobility Specialist Stop Time (ACUTE ONLY) 1145  Mobility Specialist Time Calculation (min) (ACUTE ONLY) 15 min   Pt agreeable to mobility session, however weaker and incr SOB compared to yesterday. Required minA to stand from EOB and to ambulate short distance. VSS on RA until ~68ft when pt desat. 3LO2 needed to maintains SpO2 WFL and cues for PLB. RN and MD notified of decline since yesterday. Back in bed with all needs met.   Therisa Rana Mobility Specialist Please contact via SecureChat or  Rehab office at [phone]

## 2023-12-05 NOTE — Progress Notes (Incomplete)
 PROGRESS NOTE    Guy Espinoza  FMW:[id number] DOB: 1926-05-28 DOA: 12/01/2023 PCP: Nanci Senior, MD  89/M w PAF CAD, pulm HTN, systolic CHF, CAD  presented with dyspnea x 1 to 2 weeks of dyspnea with cough.On the day of admission he was evaluated at the EP clinic and was found hypoxemic, 87 to 88% on ambient air along with trace edema at his lower extremities. He was referred to the ED for further evaluation.  Na 147,bun 30 cr 0,94 , CXR wpositive cardiomegaly, mild hilar vascular congestion, pacemaker in place  CT chest with centrilobular emphysema, small bilateral pleural effusions, with bilateral basal atelectasis.  Stable 7 mm right upper lobe peri fissural subpleural pulmonary nodule, mild cardiomegaly.    -placed on diuresis, bronchodilator therapy and steroids.  09/08 clinically improving, will need supplemental 02 at home.   Subjective:   Assessment and Plan:  Acute on chronic systolic CHF  Mod to Severe MR PULM HTN -Echo with EF 35 to 40%, global hypokinesis, mild dilatated LV cavity, grade II diastolic dysfunction , RV systolic function preserved, moderate to severe mitral valve regurgitation, moderate tricuspid regurgitation,  -Improved volume status -Continue with SGLT 2 inh, spironolactone , losartan  and metoprolol  succinate.  Resume loop diuretic in the next 24 hrs   Acute hypoxemic respiratory failure due to acute cardiogenic pulmonary edema, and COPD exacerbation. -9/08 follow up radiograph with persistent right sided pleural effusion with basilar atelectasis.  -Continue with bronchodilator therapy along with steroids Add airway clearing techniques with flutter valve and incentive spirometer.   Essential hypertension Continue blood pressure control with losartan  and metoprolol .   Coronary artery disease involving native coronary artery of native heart Continue blood pressure control with losartan  and metoprolol  succinate.   Atrial fibrillation, chronic  (HCC) Continue metoprolol  succinate for rate control. Continue anticoagulation with apixaban .  Continue telemetry monitoring   Hypernatremia Renal function stable with serum cr at 1,1 with K at 3,6 and serum bicarbonate at 30  Na 144 and Mg 2.2   Continue with SGLT 2 inh and spironolactone  Follow up renal function and electrolytes in am.   Spinal stenosis Pt and OT.   COPD with acute exacerbation (HCC) Continue systemic corticosteroids with prednisone  40 mg po daily.  Bronchodilator therapy and inhaled corticosteroids.  Continue oxymetry monitoring and supplemental 02 per [place].  Add airway clearing techniques with flutter valve and incentive spirometer.   Pure hypercholesterolemia Continue statin therapy   DVT prophylaxis: apixaban  Code Status: Full Code Family Communication: Disposition Plan:   Consultants:    Procedures:   Antimicrobials:    Objective: Vitals:   12/05/23 0415 12/05/23 0833 12/05/23 1009 12/05/23 1118  BP: 117/81 115/80 112/80 111/62  Pulse: (!) 31 70    Resp: (!) 21 12    Temp: 98 F (36.7 C) (!) 97.1 F (36.2 C)    TempSrc: Oral Axillary    SpO2: 99% 98%    Weight: 49.4 kg     Height:        Intake/Output Summary (Last 24 hours) at 12/05/2023 1423 Last data filed at 12/05/2023 0400 Gross per 24 hour  Intake 240 ml  Output 900 ml  Net -660 ml   Filed Weights   12/01/23 1259 12/04/23 0614 12/05/23 0415  Weight: 53.1 kg 50.1 kg 49.4 kg    Examination:     Data Reviewed:   CBC: Recent Labs  Lab 12/01/23 1445 12/01/23 1452 12/02/23 0423  WBC 9.8  --  6.4  HGB 12.1* 12.2*  12.6* 12.3*  HCT 38.1* 36.0*  37.0* 38.2*  MCV 100.0  --  99.0  PLT 194  --  177   Basic Metabolic Panel: Recent Labs  Lab 12/01/23 1445 12/01/23 1452 12/02/23 0423 12/03/23 0243 12/05/23 0827  NA 147* 148*  147* 147* 144 140  K 3.8 3.7  3.7 3.5 3.6 4.0  CL 107 110 108 103 102  CO2 27  --  27 30 31   GLUCOSE 125* 124* 154* 143* 99  BUN 30*  30* 31* 49* 41*  CREATININE 0.94 1.00 1.13 1.12 1.13  CALCIUM  9.2  --  8.8* 8.7* 8.3*  MG  --   --   --  2.2  --    GFR: Estimated Creatinine Clearance: 27.3 mL/min (by C-G formula based on SCr of 1.13 mg/dL). Liver Function Tests: No results for input(s): AST, ALT, ALKPHOS, BILITOT, PROT, ALBUMIN in the last 168 hours. No results for input(s): LIPASE, AMYLASE in the last 168 hours. No results for input(s): AMMONIA in the last 168 hours. Coagulation Profile: Recent Labs  Lab 12/02/23 0423  INR 1.4*   Cardiac Enzymes: No results for input(s): CKTOTAL, CKMB, CKMBINDEX, TROPONINI in the last 168 hours. BNP (last 3 results) Recent Labs    08/24/23 1119  PROBNP 973*   HbA1C: No results for input(s): HGBA1C in the last 72 hours. CBG: No results for input(s): GLUCAP in the last 168 hours. Lipid Profile: No results for input(s): CHOL, HDL, LDLCALC, TRIG, CHOLHDL, LDLDIRECT in the last 72 hours. Thyroid Function Tests: No results for input(s): TSH, T4TOTAL, FREET4, T3FREE, THYROIDAB in the last 72 hours. Anemia Panel: No results for input(s): VITAMINB12, FOLATE, FERRITIN, TIBC, IRON, RETICCTPCT in the last 72 hours. Urine analysis:    Component Value Date/Time   COLORURINE YELLOW 06/16/2017 1131   APPEARANCEUR CLEAR 06/16/2017 1131   LABSPEC 1.028 06/16/2017 1131   PHURINE 5.0 06/16/2017 1131   GLUCOSEU NEGATIVE 06/16/2017 1131   HGBUR NEGATIVE 06/16/2017 1131   BILIRUBINUR NEGATIVE 06/16/2017 1131   KETONESUR NEGATIVE 06/16/2017 1131   PROTEINUR NEGATIVE 06/16/2017 1131   UROBILINOGEN 0.2 04/19/2008 0241   NITRITE NEGATIVE 06/16/2017 1131   LEUKOCYTESUR NEGATIVE 06/16/2017 1131   Sepsis Labs: @LABRCNTIP (procalcitonin:4,lacticidven:4)  )No results found for this or any previous visit (from the past 240 hours).   Radiology Studies: DG Chest 1 View Result Date: 12/04/2023 CLINICAL DATA:  Shortness of breath  EXAM: CHEST  1 VIEW COMPARISON:  12/01/2023 FINDINGS: Cardiomegaly. Pacer wires in the right heart, unchanged. Aortic atherosclerosis. Focal airspace opacity in the right lower lobe, increasing since prior study. Minimal left basilar opacity likely reflects atelectasis. No visible effusions. No acute bony abnormality. IMPRESSION: Cardiomegaly. Worsening right lower lobe airspace opacity concerning for pneumonia. Left base atelectasis. Electronically Signed   By: Franky Crease M.D.   On: 12/04/2023 17:41   DG Swallowing Func-Speech Pathology Result Date: 12/04/2023 Table formatting from the original result was not included. Modified Barium Swallow Study Patient Details Name: Guy Espinoza MRN: [id number] Date of Birth: 12-10-1926 Today's Date: 12/04/2023 HPI/PMH: HPI: Patient is a 89 y.o. male with PMH: a-fib, HTN, asthma, pacemaker, pulmonary hypertension, severe CAD with multiple blockages per cath 07/2023. He presented to the hospital on 12/02/23 for SOB. CT showed bilateral pleural effusion and pulmonary nodule. SLP swallow evaluation ordered secondary to coughing with PO intake. Clinical Impression: Pt presents with a mild oral dysphagia, a moderate pharyngeal dysphagia, and concerns for an esophageal dysphagia (?primary source of dysphagia) per MBSS completed  today. There was aspiration of thin and nectar-thick liquids. Improved airway protection observed with chin tuck with thin liquids. Findings: -There was scant, silent aspiration of thin liquids by spoon during/after the swallow and  frank, audible aspiration of thin liquids by straw before/during the swallow.  Pt with head in neutral position for both trials. -Chin tuck was effective for aiding airway protection completely or reducing airway violation to scant, transient or stagnant shallow penetration of thin liquids by straw. -There was scant, silent aspiration of nectar-thick liquid wash (post cracker trial) after the swallow. Pt's head was in neutral  position. -No penetration or aspiration of nectar-thick liquids by cup. -Shallow penetration without ejection of thin liquids by cup (head in neutral) observed. -There was min-mod diffuse pharyngeal residue following thin, nectar-thick liquids and mod-large oropharyngeal residue following puree and cracker trials. Residue reduced with cued additional swallows x1-2. Oral deficits characterized by reduced oral strength and lingual propulsion resulting in min-moderate oral residue of pudding and solids post initial swallow. Residue eventually reduced with additional swallows. Pharyngeal deficits characterized by reduced hyolaryngeal elevation/excursion, reduced base of tongue retraction, absent epiglottic inversion, reduced laryngeal vestibule closure, and reduced pharyngeal stripping. Concerns for esophageal dysphagia included moderate rentention of pudding and solid trials in the mid-lower esophagus with some retrograde flow below the UES. Pudding residue did clear with a liquid wash; however, the cracker residue remained despite multiple liquid washes. Due to esophageal retention of solids, discussed diet modification to mechanical ground/altered with pt and wife. Detailed diet recommendations outlined below. Please see follow up treatment note for further information pertaining to education and diet modification after discussion with pt and wife. Factors that may increase risk of adverse event in presence of aspiration Noe & Lianne 2021): Factors that may increase risk of adverse event in presence of aspiration Noe & Lianne 2021): Frail or deconditioned; Weak cough Recommendations/Plan: Swallowing Evaluation Recommendations Swallowing Evaluation Recommendations Recommendations: PO diet PO Diet Recommendation: Dysphagia 2 (Finely chopped); Thin liquids (Level 0) Liquid Administration via: Straw (with chin tuck!) Medication Administration: Whole meds with puree Supervision: Full supervision/cueing for  swallowing strategies Swallowing strategies  : Minimize environmental distractions; Slow rate; Small bites/sips; Follow solids with liquids; Multiple dry swallows after each bite/sip; Chin tuck Postural changes: Position pt fully upright for meals; Stay upright 30-60 min after meals Oral care recommendations: Oral care BID (2x/day) Recommended consults: Consider esophageal assessment Treatment Plan Treatment Plan Treatment recommendations: Therapy as outlined in treatment plan below Follow-up recommendations: Skilled nursing-short term rehab (<3 hours/day) Functional status assessment: Patient has had a recent decline in their functional status and demonstrates the ability to make significant improvements in function in a reasonable and predictable amount of time. Treatment frequency: Min 1x/week Treatment duration: 2 weeks Interventions: Aspiration precaution training; Compensatory techniques; Patient/family education; Diet toleration management by SLP Recommendations Recommendations for follow up therapy are one component of a multi-disciplinary discharge planning process, led by the attending physician.  Recommendations may be updated based on patient status, additional functional criteria and insurance authorization. Assessment: Orofacial Exam: Orofacial Exam Oral Cavity: Oral Hygiene: WFL Oral Cavity - Dentition: Dentures, top (partial) Orofacial Anatomy: WFL Oral Motor/Sensory Function: WFL Anatomy: Anatomy: Prominent cricopharyngeus (appearance of a CP bar below UES - limited view due to shoulder position, though did not appear to impact bolus flow) Boluses Administered: Boluses Administered Boluses Administered: Thin liquids (Level 0); Mildly thick liquids (Level 2, nectar thick); Puree; Solid  Oral Impairment Domain: Oral Impairment Domain Lip Closure: No labial escape  Tongue control during bolus hold: Cohesive bolus between tongue to palatal seal Bolus preparation/mastication: Slow prolonged  chewing/mashing with complete recollection Bolus transport/lingual motion: Brisk tongue motion Oral residue: Residue collection on oral structures (with puree and cracker) Location of oral residue : Tongue; Palate Initiation of pharyngeal swallow : Valleculae (WNL for pt's age)  Pharyngeal Impairment Domain: Pharyngeal Impairment Domain Soft palate elevation: No bolus between soft palate (SP)/pharyngeal wall (PW) Laryngeal elevation: Partial superior movement of thyroid cartilage/partial approximation of arytenoids to epiglottic petiole Anterior hyoid excursion: Partial anterior movement Epiglottic movement: No inversion Laryngeal vestibule closure: Incomplete, narrow column air/contrast in laryngeal vestibule Pharyngeal stripping wave : Present - diminished Pharyngeal contraction (A/P view only): N/A Pharyngoesophageal segment opening: Partial distention/partial duration, partial obstruction of flow Tongue base retraction: Narrow column of contrast or air between tongue base and PPW Pharyngeal residue: Collection of residue within or on pharyngeal structures  Esophageal Impairment Domain: Esophageal Impairment Domain Esophageal clearance upright position: Esophageal retention with retrograde flow below pharyngoesophageal segment (PES) Pill: No data recorded Penetration/Aspiration Scale Score: Penetration/Aspiration Scale Score 1.  Material does not enter airway: Thin liquids (Level 0); Mildly thick liquids (Level 2, nectar thick) 2.  Material enters airway, remains ABOVE vocal cords then ejected out: Thin liquids (Level 0); Mildly thick liquids (Level 2, nectar thick) 3.  Material enters airway, remains ABOVE vocal cords and not ejected out: Thin liquids (Level 0); Mildly thick liquids (Level 2, nectar thick); Puree; Solid 6.  Material enters airway, passes BELOW cords then ejected out: Mildly thick liquids (Level 2, nectar thick) 7.  Material enters airway, passes BELOW cords and not ejected out despite cough  attempt by patient: Thin liquids (Level 0) 8.  Material enters airway, passes BELOW cords without attempt by patient to eject out (silent aspiration) : Thin liquids (Level 0); Mildly thick liquids (Level 2, nectar thick) Compensatory Strategies: Compensatory Strategies Compensatory strategies: Yes Straw: Effective (only with chin tuck; worsened airway protection without using chin tuck strategy) Effective Straw: Thin liquid (Level 0) Multiple swallows: Effective (reduced pharygneal residue) Effective Multiple Swallows: Thin liquid (Level 0); Puree; Solid; Mildly thick liquid (Level 2, nectar thick) Chin tuck: Effective Effective Chin Tuck: Thin liquid (Level 0)   General Information: Caregiver present: Yes (at bedside following study)  Diet Prior to this Study: Regular; Thin liquids (Level 0)   Temperature : Normal   Respiratory Status: Increased WOB   Supplemental O2: Nasal cannula (1L)   History of Recent Intubation: No  Behavior/Cognition: Alert; Cooperative Self-Feeding Abilities: Able to self-feed Baseline vocal quality/speech: Dysphonic Volitional Cough: Able to elicit Volitional Swallow: Able to elicit (delayed) Exam Limitations: Limited visibility (shoulder position) Goal Planning: Prognosis for improved oropharyngeal function: Fair Barriers to Reach Goals: Overall medical prognosis No data recorded Patient/Family Stated Goal: safe PO intake Consulted and agree with results and recommendations: Patient; Family member/caregiver; Nurse Pain: Pain Assessment Pain Assessment: No/denies pain End of Session: Start Time:SLP Start Time (ACUTE ONLY): 1030 Stop Time: SLP Stop Time (ACUTE ONLY): 1100 Time Calculation:SLP Time Calculation (min) (ACUTE ONLY): 30 min Charges: SLP Evaluations $ SLP Speech Visit: 1 Visit SLP Evaluations $MBS Swallow: 1 Procedure SLP visit diagnosis: SLP Visit Diagnosis: Dysphagia, oropharyngeal phase (R13.12) Past Medical History: Past Medical History: Diagnosis Date  Asthma   Hypertension   Past Surgical History: Past Surgical History: Procedure Laterality Date  CATARACT EXTRACTION Left 2007  double hernia repair  2010  HERNIA REPAIR  1984  KNEE SURGERY Left 1992  PACEMAKER IMPLANT N/A 08/30/2023  Procedure: PACEMAKER IMPLANT;  Surgeon: Kennyth Chew, MD;  Location: [facility] INVASIVE CV LAB;  Service: Cardiovascular;  Laterality: N/A;  RIGHT/LEFT HEART CATH AND CORONARY ANGIOGRAPHY N/A 08/10/2023  Procedure: RIGHT/LEFT HEART CATH AND CORONARY ANGIOGRAPHY;  Surgeon: [place], Peter M, MD;  Location: [facility] INVASIVE CV LAB;  Service: Cardiovascular;  Laterality: N/A;  ROTATOR CUFF REPAIR Left 2001  ruptured blood vessel L eye Left 2008 Allyson J Roberts 12/04/2023, 12:47 PM    Scheduled Meds:  apixaban   2.5 mg Oral BID   atorvastatin   20 mg Oral Daily   budesonide -glycopyrrolate -formoterol   2 puff Inhalation BID   diclofenac  Sodium  2 g Topical QID   feeding supplement  237 mL Oral BID BM   losartan   25 mg Oral Daily   metoprolol  succinate  25 mg Oral Daily   multivitamin with minerals  1 tablet Oral Daily   predniSONE   40 mg Oral Q breakfast   spironolactone   12.5 mg Oral Daily   tamsulosin   0.4 mg Oral QPC supper   Continuous Infusions:   LOS: 3 days    Time spent:    Sigurd Pac, MD [facility]   12/05/2023, 2:23 PM

## 2023-12-05 NOTE — Assessment & Plan Note (Signed)
Continue nutritional supplements. °

## 2023-12-05 NOTE — Progress Notes (Signed)
 Nurse requested Mobility Specialist to perform oxygen saturation test with pt which includes removing pt from oxygen both at rest and while ambulating.  Below are the results from that testing.     Patient Saturations on Room Air at Rest = spO2 96%  Patient Saturations on Room Air while Ambulating = sp02 85% .    Patient Saturations on 3 Liters of oxygen while Ambulating = sp02 91%  At end of testing pt left in room on 2  Liters of oxygen.  Reported results to nurse.   Therisa Rana Mobility Specialist Please contact via SecureChat or  Rehab office at [phone]

## 2023-12-06 ENCOUNTER — Ambulatory Visit: Admitting: Internal Medicine

## 2023-12-06 DIAGNOSIS — I509 Heart failure, unspecified: Secondary | ICD-10-CM

## 2023-12-06 DIAGNOSIS — I5023 Acute on chronic systolic (congestive) heart failure: Secondary | ICD-10-CM | POA: Diagnosis not present

## 2023-12-06 DIAGNOSIS — R0600 Dyspnea, unspecified: Secondary | ICD-10-CM

## 2023-12-06 DIAGNOSIS — I10 Essential (primary) hypertension: Secondary | ICD-10-CM | POA: Diagnosis not present

## 2023-12-06 LAB — BASIC METABOLIC PANEL WITH GFR
Anion gap: 9 (ref 5–15)
BUN: 41 mg/dL — ABNORMAL HIGH (ref 8–23)
CO2: 28 mmol/L (ref 22–32)
Calcium: 8.1 mg/dL — ABNORMAL LOW (ref 8.9–10.3)
Chloride: 102 mmol/L (ref 98–111)
Creatinine, Ser: 1.08 mg/dL (ref 0.61–1.24)
GFR, Estimated: >60 mL/min (ref >60)
Glucose, Bld: 130 mg/dL — ABNORMAL HIGH (ref 70–99)
Potassium: 4.5 mmol/L (ref 3.5–5.1)
Sodium: 139 mmol/L (ref 135–145)

## 2023-12-06 LAB — CBC
HCT: 38 % — ABNORMAL LOW (ref 39.0–52.0)
Hemoglobin: 12.3 g/dL — ABNORMAL LOW (ref 13.0–17.0)
MCH: 31.5 pg (ref 26.0–34.0)
MCHC: 32.4 g/dL (ref 30.0–36.0)
MCV: 97.2 fL (ref 80.0–100.0)
Platelets: 181 K/uL (ref 150–400)
RBC: 3.91 MIL/uL — ABNORMAL LOW (ref 4.22–5.81)
RDW: 13.3 % (ref 11.5–15.5)
WBC: 9 K/uL (ref 4.0–10.5)
nRBC: 0 % (ref 0.0–0.2)

## 2023-12-06 LAB — MAGNESIUM: Magnesium: 2.4 mg/dL (ref 1.7–2.4)

## 2023-12-06 NOTE — Progress Notes (Signed)
 [facility]                                                                              Guy Espinoza, is a 89 y.o. male, DOB - 1926-11-30, FMW:[id number] Admit date - 12/01/2023    Outpatient Primary MD for the patient is Nanci Senior, MD  LOS - 4  days  Chief Complaint  Patient presents with   Shortness of Breath       Brief summary   Patient is a 89 year old male with atrial fibrillation, hypertension, coronary artery disease and pulmonary hypertension, who presented with dyspnea.  Reported about 1 to 2 weeks of dyspnea on exertion, denied any chest pain, edema or palpitations. Positive cough and increased sputum production. On the day of admission he was evaluated at the EP clinic and was found hypoxemic, 87 to 88% on ambient air along with trace edema at his lower extremities. He was referred to the ED for further evaluation.  CT chest with centrilobular emphysema, small bilateral pleural effusions, with bilateral basal atelectasis. Stable 7 mm right upper lobe peri fissural subpleural pulmonary nodule, mild cardiomegaly.   Patient was placed on diuresis, bronchodilator therapy and steroids.  09/08 clinically improving, will need supplemental 02 at home.  09/09 patient with worsening increased of breathing and debility Chest radiograph with worsening right lower lobe infiltrate, likely aspiration pneumonia.   Assessment & Plan    Principal Problem:   Acute on chronic systolic CHF (congestive heart failure) (HCC) - 2D echo showed reduced LV systolic function with EF 35 to 40%, global hypokinesis, mild dilatated LV cavity, G2 DD, severe dilatation right and left atrium, moderate to severe MR, moderate TR, no aortic stenosis, trivial pericardial effusion -Continue with spironolactone , losartan  and metoprolol  succinate.  - Continue to hold on furosemide , stop SGLT 2 inh due to frailty    Acute hypoxemic respiratory failure, COPD exacerbation - due to acute  cardiogenic pulmonary edema, aspiration PNA and COPD exacerbation. -O2 sats 97 to 100% on 2 L O2 via [place] -Chest x-ray 09/09 persistent right lower lobe infiltrate.  - Aspiration pneumonia not present on admission, continue antibiotics, nebs, steroids  - Continue flutter valve and incentive spirometer.    Essential hypertension, CAD BP stable, continue losartan , metoprolol , Aldactone     Atrial fibrillation, chronic (HCC) -Continue beta-blocker, apixaban    Hypernatremia Sodium improving, 148 on 9/5  - Sodium 139 today  - Continue Aldactone      Spinal stenosis PT evaluation recommended home health PT OT.      Hyperlipidemia Continue statin    Protein-calorie malnutrition, severe Nutrition Problem: Severe Malnutrition Etiology: chronic illness (CHF, dysphagia) Signs/Symptoms: severe fat depletion, severe muscle depletion Interventions: Ensure Enlive (each supplement provides 350kcal and 20 grams of protein), Magic cup  Underweight, debility Estimated body mass index is 15.63 kg/m as calculated from the following:   Height as of this encounter: 5' 9 (1.753 m).   Weight as of this encounter: 48 kg.  Code Status: DNR DVT Prophylaxis:  apixaban  (ELIQUIS ) tablet 2.5 mg Start: 12/02/23 1000 apixaban  (ELIQUIS ) tablet 2.5 mg   Level of Care: Level of care: Telemetry Cardiac Family Communication:  Updated patient Disposition Plan:      Remains inpatient appropriate: Possible DC home tomorrow   Procedures:  2D echo  Consultants:     Antimicrobials:   Anti-infectives (From admission, onward)    Start     Dose/Rate Route Frequency Ordered Stop   12/05/23 2000  ampicillin -sulbactam (UNASYN ) 1.5 g in sodium chloride  0.9 % 100 mL IVPB        1.5 g 200 mL/hr over 30 Minutes Intravenous Every 12 hours 12/05/23 1952            Medications  apixaban   2.5 mg Oral BID   atorvastatin   20 mg Oral Daily   budesonide -glycopyrrolate -formoterol   2 puff Inhalation BID    diclofenac  Sodium  2 g Topical QID   feeding supplement  237 mL Oral BID BM   losartan   25 mg Oral Daily   metoprolol  succinate  25 mg Oral Daily   multivitamin with minerals  1 tablet Oral Daily   predniSONE   40 mg Oral Q breakfast   spironolactone   12.5 mg Oral Daily   tamsulosin   0.4 mg Oral QPC supper      Subjective:   Guy Espinoza was seen and examined today.  Frail and ill-appearing otherwise no acute complaints.  Patient denies dizziness, chest pain, shortness of breath, abdominal pain, N/V/D/C. No acute events overnight.    Objective:   Vitals:   12/06/23 0500 12/06/23 0600 12/06/23 0751 12/06/23 1256  BP:   119/82 120/74  Pulse:  70 69 72  Resp: 18 18 15  (!) 25  Temp:  (!) 97.5 F (36.4 C) 97.9 F (36.6 C) (!) 97.2 F (36.2 C)  TempSrc:  Oral Oral Oral  SpO2:  99% 99% 100%  Weight:      Height:        Intake/Output Summary (Last 24 hours) at 12/06/2023 1412 Last data filed at 12/06/2023 1041 Gross per 24 hour  Intake --  Output 1150 ml  Net -1150 ml     Wt Readings from Last 3 Encounters:  12/06/23 48 kg  12/01/23 53.1 kg  11/02/23 53.1 kg     Exam General: Alert and oriented x 3, NAD, frail, ill-appearing Cardiovascular: S1 S2 auscultated,  RRR Respiratory: Diminished breath sound at the bases Gastrointestinal: Soft, nontender, nondistended, + bowel sounds Ext: no pedal edema bilaterally Neuro: No new deficits Psych: Normal affect     Data Reviewed:  I have personally reviewed following labs    CBC Lab Results  Component Value Date   WBC 9.0 12/06/2023   RBC 3.91 (L) 12/06/2023   HGB 12.3 (L) 12/06/2023   HCT 38.0 (L) 12/06/2023   MCV 97.2 12/06/2023   MCH 31.5 12/06/2023   PLT 181 12/06/2023   MCHC 32.4 12/06/2023   RDW 13.3 12/06/2023   LYMPHSABS 1.1 04/10/2008   MONOABS 0.5 04/10/2008   EOSABS 0.2 04/10/2008   BASOSABS 0.0 04/10/2008     Last metabolic panel Lab Results  Component Value Date   NA 139 12/06/2023   K 4.5  12/06/2023   CL 102 12/06/2023   CO2 28 12/06/2023   BUN 41 (H) 12/06/2023   CREATININE 1.08 12/06/2023   GLUCOSE 130 (H) 12/06/2023   GFRNONAA >60 12/06/2023   GFRAA >60 06/16/2017   CALCIUM  8.1 (L) 12/06/2023   PROT 7.0 10/24/2023   ALBUMIN 4.5 10/24/2023   BILITOT 1.5 (H) 10/24/2023   ALKPHOS 62 10/24/2023   AST 23 10/24/2023   ALT 19 10/24/2023  ANIONGAP 9 12/06/2023    CBG (last 3)  No results for input(s): GLUCAP in the last 72 hours.    Coagulation Profile: Recent Labs  Lab 12/02/23 0423  INR 1.4*     Radiology Studies: I have personally reviewed the imaging studies  DG Chest 1 View Result Date: 12/05/2023 CLINICAL DATA:  Short of breath EXAM: CHEST  1 VIEW COMPARISON:  12/04/2023 FINDINGS: 2 frontal views of the chest demonstrates stable dual lead pacemaker. The cardiac silhouette remains enlarged. Continued ectasia and atherosclerosis of the thoracic aorta. Stable veiling opacity at the right lung base consistent with consolidation and/or effusion. Persistent vascular congestion. No pneumothorax. No acute bony abnormalities. IMPRESSION: 1. Stable pulmonary vascular congestion and right basilar consolidation and/or effusion. 2. Stable enlarged cardiac silhouette. Electronically Signed   By: Ozell Daring M.D.   On: 12/05/2023 17:26       Jaquelyne Firkus M.D. [facility] 12/06/2023, 2:12 PM  Available via Epic secure chat 7am-7pm After 7 pm, please refer to night coverage provider listed on amion.

## 2023-12-06 NOTE — Progress Notes (Signed)
 Occupational Therapy Treatment Patient Details Name: Guy Espinoza MRN: [id number] DOB: 08/06/1926 Today's Date: 12/06/2023   History of present illness Pt is a 89 y.o. male admitted 9/5 for SOB. CT showed bil pleural effusion & pulmonary nodule.  PMH: HTN, asthma, pacemaker, afib, CAD, CHF, AV block   OT comments  Pt progressing well towards goals. Progressed to complete toilet transfer to regular toilet in bathroom at [facility]. Pt able to complete own hygiene at CGA to min assist as fatigue sets. Pt requiring x3 rest breaks and increased time to complete hygiene and mobilize to recliner in room. Decreased activity tolerance and strength continue to limit pt. Continue to recommend HHOT to optimize independence levels. Will continue to follow acutely.      If plan is discharge home, recommend the following:  A little help with walking and/or transfers;A little help with bathing/dressing/bathroom;Assistance with cooking/housework   Equipment Recommendations  Tub/shower seat       Precautions / Restrictions Precautions Precautions: Fall Recall of Precautions/Restrictions: Intact Restrictions Weight Bearing Restrictions Per Provider Order: No       Mobility Bed Mobility Overal bed mobility: Modified Independent       General bed mobility comments: Increased time and effort    Transfers Overall transfer level: Needs assistance Equipment used: Rolling walker (2 wheels) Transfers: Sit to/from Stand Sit to Stand: Contact guard assist   General transfer comment: CGA for balance. In bathroom as fatigue sets increased reliance on GB     Balance Overall balance assessment: Needs assistance Sitting-balance support: No upper extremity supported, Feet supported Sitting balance-Leahy Scale: Good   Postural control: Posterior lean Standing balance support: Bilateral upper extremity supported, During functional activity, Reliant on assistive device for balance Standing balance-Leahy Scale:  Poor Standing balance comment: Reliant on BUE and external support       ADL either performed or assessed with clinical judgement   ADL Overall ADL's : Needs assistance/impaired     Grooming: Wash/dry hands;Sitting Grooming Details (indicate cue type and reason): D/t fatigue       Toilet Transfer: Contact guard assist;Rolling walker (2 wheels);Minimal assistance;Regular Toilet;Ambulation Toilet Transfer Details (indicate cue type and reason): As fatigue sets min to mod assist Toileting- Clothing Manipulation and Hygiene: Contact guard assist;Minimal assistance;Sit to/from stand Toileting - Clothing Manipulation Details (indicate cue type and reason): Required x2 rest breaks during hygiene     Functional mobility during ADLs: Minimal assistance;Rolling walker (2 wheels) General ADL Comments: Decreased activity tolerance requiring x3 rest breaks throughout session    Extremity/Trunk Assessment Upper Extremity Assessment Upper Extremity Assessment: Generalized weakness   Lower Extremity Assessment Lower Extremity Assessment: Defer to PT evaluation        Vision   Vision Assessment?: No apparent visual deficits         Communication Communication Communication: Impaired Factors Affecting Communication: Hearing impaired   Cognition Arousal: Alert Behavior During Therapy: WFL for tasks assessed/performed Cognition: No apparent impairments     Following commands: Intact        Cueing   Cueing Techniques: Verbal cues        General Comments Pt voided BM and urine RN notified    Pertinent Vitals/ Pain       Pain Assessment Pain Assessment: No/denies pain   Frequency  Min 2X/week        Progress Toward Goals  OT Goals(current goals can now be found in the care plan section)  Progress towards OT goals: Progressing toward goals  Acute  Rehab OT Goals Patient Stated Goal: To rest OT Goal Formulation: With patient Time For Goal Achievement:  12/17/23 Potential to Achieve Goals: Good ADL Goals Pt Will Perform Grooming: with supervision;standing Pt Will Perform Lower Body Dressing: with supervision;sitting/lateral leans;sit to/from stand Pt Will Transfer to Toilet: with supervision;regular height toilet;ambulating Pt Will Perform Toileting - Clothing Manipulation and hygiene: with supervision;sitting/lateral leans;sit to/from stand Pt Will Perform Tub/Shower Transfer: with supervision;ambulating;shower seat  Plan         AM-PAC OT 6 Clicks Daily Activity     Outcome Measure   Help from another person eating meals?: None Help from another person taking care of personal grooming?: A Little Help from another person toileting, which includes using toliet, bedpan, or urinal?: A Little Help from another person bathing (including washing, rinsing, drying)?: A Little Help from another person to put on and taking off regular upper body clothing?: A Little Help from another person to put on and taking off regular lower body clothing?: A Little 6 Click Score: 19    End of Session Equipment Utilized During Treatment: Rolling walker (2 wheels);Gait belt  OT Visit Diagnosis: Unsteadiness on feet (R26.81);Other abnormalities of gait and mobility (R26.89);Muscle weakness (generalized) (M62.81)   Activity Tolerance Patient tolerated treatment well   Patient Left with call bell/phone within reach;in CPM;with chair alarm set;with family/visitor present   Nurse Communication Mobility status        Time: 1100-1139 OT Time Calculation (min): 39 min  Charges: OT General Charges $OT Visit: 1 Visit OT Treatments $Self Care/Home Management : 23-37 mins  Adrianne BROCKS, OT  Acute Rehabilitation Services Office [phone] Secure chat preferred   Adrianne GORMAN Savers 12/06/2023, 12:55 PM

## 2023-12-07 DIAGNOSIS — I5023 Acute on chronic systolic (congestive) heart failure: Secondary | ICD-10-CM | POA: Diagnosis not present

## 2023-12-07 DIAGNOSIS — I509 Heart failure, unspecified: Secondary | ICD-10-CM | POA: Diagnosis not present

## 2023-12-07 DIAGNOSIS — I10 Essential (primary) hypertension: Secondary | ICD-10-CM | POA: Diagnosis not present

## 2023-12-07 DIAGNOSIS — R0600 Dyspnea, unspecified: Secondary | ICD-10-CM | POA: Diagnosis not present

## 2023-12-07 NOTE — Plan of Care (Signed)
  Problem: Clinical Measurements: Goal: Respiratory complications will improve Outcome: Progressing   Problem: Activity: Goal: Risk for activity intolerance will decrease Outcome: Progressing   Problem: Nutrition: Goal: Adequate nutrition will be maintained Outcome: Progressing   Problem: Safety: Goal: Ability to remain free from injury will improve Outcome: Progressing   Problem: Skin Integrity: Goal: Risk for impaired skin integrity will decrease Outcome: Progressing   

## 2023-12-07 NOTE — Progress Notes (Signed)
 [facility]                                                                              Guy Espinoza, is a 89 y.o. male, DOB - Aug 03, 1926, FMW:[id number] Admit date - 12/01/2023    Outpatient Primary MD for the patient is Nanci Senior, MD  LOS - 5  days  Chief Complaint  Patient presents with   Shortness of Breath       Brief summary   Patient is a 89 year old male with atrial fibrillation, hypertension, coronary artery disease and pulmonary hypertension, who presented with dyspnea.  Reported about 1 to 2 weeks of dyspnea on exertion, denied any chest pain, edema or palpitations. Positive cough and increased sputum production. On the day of admission he was evaluated at the EP clinic and was found hypoxemic, 87 to 88% on ambient air along with trace edema at his lower extremities. He was referred to the ED for further evaluation.  CT chest with centrilobular emphysema, small bilateral pleural effusions, with bilateral basal atelectasis. Stable 7 mm right upper lobe peri fissural subpleural pulmonary nodule, mild cardiomegaly.   Patient was placed on diuresis, bronchodilator therapy and steroids.  09/08 clinically improving, will need supplemental 02 at home.  09/09 patient with worsening increased of breathing and debility Chest radiograph with worsening right lower lobe infiltrate, likely aspiration pneumonia.   Assessment & Plan    Principal Problem:   Acute on chronic systolic CHF (congestive heart failure) (HCC) - 2D echo showed reduced LV systolic function with EF 35 to 40%, global hypokinesis, mild dilatated LV cavity, G2 DD, severe dilatation right and left atrium, moderate to severe MR, moderate TR, no aortic stenosis, trivial pericardial effusion -Continue spironolactone , losartan  and metoprolol  succinate.  - Continue to hold furosemide , stop SGLT 2 inh due to frailty    Acute hypoxemic respiratory failure, COPD exacerbation - due to acute cardiogenic  pulmonary edema, aspiration PNA and COPD exacerbation. -O2 sats 97 to 100% on 2 L O2 via [place] -Chest x-ray 09/09 persistent right lower lobe infiltrate.  - Aspiration pneumonia not present on admission, continue antibiotics, nebs, steroids  - Continue flutter valve and incentive spirometer.    Essential hypertension, CAD BP stable, continue losartan , metoprolol , Aldactone     Atrial fibrillation, chronic (HCC) -Continue beta-blocker, apixaban    Hypernatremia Sodium improving, 148 on 9/5  - Continue Aldactone      Spinal stenosis PT evaluation recommended home health PT OT.      Hyperlipidemia Continue statin    Protein-calorie malnutrition, severe Nutrition Problem: Severe Malnutrition Etiology: chronic illness (CHF, dysphagia) Signs/Symptoms: severe fat depletion, severe muscle depletion Interventions: Ensure Enlive (each supplement provides 350kcal and 20 grams of protein), Magic cup  Underweight, debility Estimated body mass index is 17.45 kg/m as calculated from the following:   Height as of this encounter: 5' 9 (1.753 m).   Weight as of this encounter: 53.6 kg.  Code Status: DNR DVT Prophylaxis:  apixaban  (ELIQUIS ) tablet 2.5 mg Start: 12/02/23 1000 apixaban  (ELIQUIS ) tablet 2.5 mg   Level of Care: Level of care: Telemetry Cardiac Family Communication: Updated patient's wife at the bedside Disposition  Plan:      Remains inpatient appropriate: Possible DC home tomorrow, very frail, deconditioned.   Procedures:  2D echo  Consultants:     Antimicrobials:   Anti-infectives (From admission, onward)    Start     Dose/Rate Route Frequency Ordered Stop   12/05/23 2000  ampicillin -sulbactam (UNASYN ) 1.5 g in sodium chloride  0.9 % 100 mL IVPB        1.5 g 200 mL/hr over 30 Minutes Intravenous Every 12 hours 12/05/23 1952            Medications  apixaban   2.5 mg Oral BID   atorvastatin   20 mg Oral Daily   budesonide -glycopyrrolate -formoterol   2 puff  Inhalation BID   diclofenac  Sodium  2 g Topical QID   feeding supplement  237 mL Oral BID BM   losartan   25 mg Oral Daily   metoprolol  succinate  25 mg Oral Daily   multivitamin with minerals  1 tablet Oral Daily   predniSONE   40 mg Oral Q breakfast   spironolactone   12.5 mg Oral Daily   tamsulosin   0.4 mg Oral QPC supper      Subjective:   Guy Espinoza was seen and examined today.  Very frail, ill-appearing.  No acute complaints however not responding to many questions either.  Wife at the bedside.  Objective:   Vitals:   12/07/23 0135 12/07/23 0326 12/07/23 0816 12/07/23 1107  BP:  110/80 117/84 111/73  Pulse: 69 69 70 68  Resp: 18 20 20 15   Temp:  97.9 F (36.6 C) 97.9 F (36.6 C) 97.6 F (36.4 C)  TempSrc:  Oral Oral Axillary  SpO2: 100% 99% 97% 99%  Weight: 53.6 kg     Height:        Intake/Output Summary (Last 24 hours) at 12/07/2023 1429 Last data filed at 12/07/2023 1000 Gross per 24 hour  Intake --  Output 1350 ml  Net -1350 ml     Wt Readings from Last 3 Encounters:  12/07/23 53.6 kg  12/01/23 53.1 kg  11/02/23 53.1 kg    Physical Exam General: Alert and awake, NAD.  Very deconditioned and frail, ill-appearing Cardiovascular: S1 S2 clear, RRR.  Respiratory: CTAB, no wheezing, rales or rhonchi Gastrointestinal: Soft, nontender, nondistended, NBS Ext: no pedal edema bilaterally Neuro: Not following commands Psych: Flat affect   Data Reviewed:  I have personally reviewed following labs    CBC Lab Results  Component Value Date   WBC 9.0 12/06/2023   RBC 3.91 (L) 12/06/2023   HGB 12.3 (L) 12/06/2023   HCT 38.0 (L) 12/06/2023   MCV 97.2 12/06/2023   MCH 31.5 12/06/2023   PLT 181 12/06/2023   MCHC 32.4 12/06/2023   RDW 13.3 12/06/2023   LYMPHSABS 1.1 04/10/2008   MONOABS 0.5 04/10/2008   EOSABS 0.2 04/10/2008   BASOSABS 0.0 04/10/2008     Last metabolic panel Lab Results  Component Value Date   NA 139 12/06/2023   K 4.5 12/06/2023    CL 102 12/06/2023   CO2 28 12/06/2023   BUN 41 (H) 12/06/2023   CREATININE 1.08 12/06/2023   GLUCOSE 130 (H) 12/06/2023   GFRNONAA >60 12/06/2023   GFRAA >60 06/16/2017   CALCIUM  8.1 (L) 12/06/2023   PROT 7.0 10/24/2023   ALBUMIN 4.5 10/24/2023   BILITOT 1.5 (H) 10/24/2023   ALKPHOS 62 10/24/2023   AST 23 10/24/2023   ALT 19 10/24/2023   ANIONGAP 9 12/06/2023    CBG (last 3)  No results for input(s): GLUCAP in the last 72 hours.    Coagulation Profile: Recent Labs  Lab 12/02/23 0423  INR 1.4*     Radiology Studies: I have personally reviewed the imaging studies  No results found.      Nydia Distance M.D. [facility] 12/07/2023, 2:29 PM  Available via Epic secure chat 7am-7pm After 7 pm, please refer to night coverage provider listed on amion.

## 2023-12-07 NOTE — Progress Notes (Addendum)
 Physical Therapy Treatment Patient Details Name: Guy Espinoza MRN: [id number] DOB: [birth year] Today's Date: 12/07/2023   History of Present Illness Pt is a 89 y.o. male admitted 9/5 for SOB. CT showed bil pleural effusion & pulmonary nodule. Increased lethargy 9/11 PT session. PMH: HTN, asthma, pacemaker, afib, CAD, CHF, AV block    PT Comments  Pt received in supine after RN assisted him with hygiene, pt with elevated RR >16 bpm and SpO2 WFL on 3L [place], spouse present. Pt lethargic and c/o generalized malaise, agreeable to therapy session. Pt needing increased assist this date to initiate and perform bed mobility and transfers and with decreased safety awareness and increased impulsivity with transfers and standing tasks. Pt needing mod/maxA for stepping up/down 7 step with single rail to simulate home set-up and was unable to perform 4 steps as he has to enter home. Pt needing min to modA for dynamic standing tasks, including pivot to [facility] and pt attempted to sit halfway between EOB and BSC requiring intervention to prevent fall. Pt and spouse asking about post-acute rehab, PTA discussed with supervising PT Logan B and updated below. Patient will benefit from continued inpatient follow up therapy, <3 hours/day, will continue to assess pending progress if pt safety improves to flip to home but currently unsafe to DC home with level of support available from elderly spouse.  Vital Signs  Patient Position (if appropriate) Orthostatic Vitals  Orthostatic Sitting (on BSC)  BP- Sitting 143/83  Orthostatic Standing at 0 minutes  BP- Standing at 0 minutes 127/75   Orthostatic Lying (after return to supine from standing)  BP- Lying 127/76  Pulse- Lying 73     If plan is discharge home, recommend the following: Help with stairs or ramp for entrance;Assist for transportation;Assistance with cooking/housework;A lot of help with walking and/or transfers;A little help with bathing/dressing/bathroom;Direct  supervision/assist for medications management;Supervision due to cognitive status (waxing/waning alertness)   Can travel by private vehicle     Yes  Equipment Recommendations  None recommended by PT (pending progress)    Recommendations for Other Services       Precautions / Restrictions Precautions Precautions: Fall Recall of Precautions/Restrictions: Impaired Precaution/Restrictions Comments: pt/spouse need reinforcement on reducing aspiration risk; pt with bowel incontinence Restrictions Weight Bearing Restrictions Per Provider Order: No     Mobility  Bed Mobility Overal bed mobility: Needs Assistance Bed Mobility: Rolling, Sit to Supine, Supine to Sit Rolling: Contact guard assist, Used rails   Supine to sit: Min assist Sit to supine: Contact guard assist, Used rails   General bed mobility comments: Increased time and effort, from flat bed without rail needing light minA for trunk raising/stability and CGA for safety bringing legs back in to bed. Pt does not have bed rail at home, sleep in flat bed.    Transfers Overall transfer level: Needs assistance Equipment used: Rolling walker (2 wheels) Transfers: Sit to/from Stand, Bed to chair/wheelchair/BSC Sit to Stand: Contact guard assist, Min assist   Step pivot transfers: Mod assist       General transfer comment: from EOB>RW with mod safety cues/instruction not to pull up on RW and minA for stabilizing upon standing; modA to pivot to [facility] after finishing step up/down due to pt fatigue and lethargy/impulsivity to sit before reaching proximity to seat and pt not reaching back. CGA to transfer to/from BSC>RW>EOB at end of session with safety cues.    Ambulation/Gait Ambulation/Gait assistance: Min assist Gait Distance (Feet): 10 Feet Assistive device: Rolling walker (  2 wheels) Gait Pattern/deviations: Decreased stride length, Trunk flexed, Knee flexed in stance - left, Antalgic, Step-through pattern, Shuffle, Wide base  of support Gait velocity: decreased   Pre-gait activities: standing marches x10 reps ea at [employer] with minA for stability General Gait Details: SpO2 92% and greater on 3L supplemental O2 with moderate dyspnea while ambulating. Distance limited due to pt reporting bowel urgency, BSC pulled up for him to sit on, then pivotal steps back to bed from [facility] due to fatigue after bedside stair training and pre-gait standing exercises.   Stairs Stairs: Yes Stairs assistance: Mod assist, Max assist Stair Management: One rail Right, Step to pattern, Backwards, Forwards Number of Stairs: 2 General stair comments: single 7 step x2 reps at bedside, pt lethargic so defer longer ambulation to hallway stairwell; pt performed initial step with minA to light modA, on second step up/down pt with posterolateral LOB requiring maxA to corret via HHA/gait belt; R rail per home set-up. Pt unable to attempt a third step due to fatigue. Pt spouse present and states she cannot provide this level of assist at home, pt and spouse both requesting short term rehab prior to home after practicing this.   Wheelchair Mobility     Tilt Bed    Modified Rankin (Stroke Patients Only)       Balance Overall balance assessment: Needs assistance Sitting-balance support: No upper extremity supported, Feet supported Sitting balance-Leahy Scale: Fair Sitting balance - Comments: supervision to CGA due to pt lethargy   Standing balance support: Bilateral upper extremity supported, During functional activity, Reliant on assistive device for balance Standing balance-Leahy Scale: Poor Standing balance comment: Reliant on BUE and external support                            Communication Communication Communication: Impaired Factors Affecting Communication: Hearing impaired  Cognition Arousal: Lethargic Behavior During Therapy: Impulsive, Flat affect   PT - Cognitive impairments: Orientation, Attention, Initiation,  Sequencing, Problem solving, Safety/Judgement   Orientation impairments: Time, Situation                   PT - Cognition Comments: Pt more lethargic today, states no when asked if he had trouble sleeping, however per day shift RN he did not sleep well. Pt spouse present and was trying to give him sips of ensure while pt was more lethargic and had eyes closed, PTA instructed her on waiting until pt opens eyes and encouraging him to assist with taking small sips to ensure alertness as per chart review pt is at higher risk of aspiration PNA. When PTA asked if he was in pain or had a specific c/o, pt states no, I just don't feel right. Of note, pt had large BM during session and reports some relief after, RN notified. Following commands: Intact      Cueing Cueing Techniques: Verbal cues  Exercises Other Exercises Other Exercises: standing hip flexion x10 reps ea to work on improved bil foot clearance in RW    General Comments General comments (skin integrity, edema, etc.): BP stable seated/standing and supine; SpO2 92% and above on 3L O2 [place], RR elevated 17-20 while seated/standing      Pertinent Vitals/Pain Pain Assessment Pain Assessment: Faces Faces Pain Scale: Hurts a little bit Pain Location: generalized Pain Descriptors / Indicators: Grimacing Pain Intervention(s): Limited activity within patient's tolerance, Monitored during session, Repositioned    Home Living  Prior Function            PT Goals (current goals can now be found in the care plan section) Acute Rehab PT Goals Patient Stated Goal: to improve mobility PT Goal Formulation: With patient Time For Goal Achievement: 12/17/23 Progress towards PT goals: Progressing toward goals    Frequency    Min 2X/week      PT Plan      Co-evaluation              AM-PAC PT 6 Clicks Mobility   Outcome Measure  Help needed turning from your back to your side while in  a flat bed without using bedrails?: A Little Help needed moving from lying on your back to sitting on the side of a flat bed without using bedrails?: A Little Help needed moving to and from a bed to a chair (including a wheelchair)?: A Little Help needed standing up from a chair using your arms (e.g., wheelchair or bedside chair)?: A Little Help needed to walk in hospital room?: A Lot Help needed climbing 3-5 steps with a railing? : Total 6 Click Score: 15    End of Session Equipment Utilized During Treatment: Gait belt;Oxygen Activity Tolerance: Patient limited by lethargy Patient left: in bed;with call bell/phone within reach;with bed alarm set;with family/visitor present;Other (comment) (spouse present; pt bil heels floated for comfort) Nurse Communication: Mobility status;Precautions;Other (comment) (pt and spouse asking about SNF; pt altered from previous session/more lethargic) PT Visit Diagnosis: Unsteadiness on feet (R26.81);Other abnormalities of gait and mobility (R26.89);Muscle weakness (generalized) (M62.81);Difficulty in walking, not elsewhere classified (R26.2)     Time: 8485-8456 PT Time Calculation (min) (ACUTE ONLY): 29 min  Charges:    $Gait Training: 8-22 mins $Therapeutic Activity: 8-22 mins PT General Charges $$ ACUTE PT VISIT: 1 Visit                     Shuntell Foody P., PTA Acute Rehabilitation Services Secure Chat Preferred 9a-5:30pm Office: [phone]    Connell HERO [facility] 12/07/2023, 4:15 PM

## 2023-12-07 NOTE — Progress Notes (Signed)
 Speech Language Pathology Treatment: Dysphagia  Patient Details Name: Guy Espinoza MRN: [id number] DOB: 06/02/1926 Today's Date: 12/07/2023 Time: 8387-8364 SLP Time Calculation (min) (ACUTE ONLY): 23 min  Assessment / Plan / Recommendation Clinical Impression  Patient was alert, awake, and participated fully in this diet check. Patient and his wife both report that he has difficulty with swallowing liquids more than solids. SLP observed patient's tolerance for thin and nectar-thick liquid. MBS on 9/8 indicated patient has a better toleration for nectar-thick liquids. Patient exhibited coughing following sips of thin liquid. Patient had no overt s/s of aspiration with nectar-thick liquids. Recommending stay on dys 2 diet (minced) but with nectar-thick liquids. Nectar- thick liquids through both a straw or a cup are fine. Patient may have thin water in between meals with cup sips only. SLP will continue to follow.   HPI HPI: Patient is a 89 y.o. male with PMH: a-fib, HTN, asthma, pacemaker, pulmonary hypertension, severe CAD with multiple blockages per cath 07/2023. He presented to the hospital on 12/02/23 for SOB. CT showed bilateral pleural effusion and pulmonary nodule. SLP swallow evaluation ordered secondary to coughing with PO intake. SLP recommended chin tuck to manage aspiration.      SLP Plan  Continue with current plan of care          Recommendations  Diet recommendations: Dysphagia 2 (fine chop);Nectar-thick liquid Liquids provided via: Cup;Straw Medication Administration: Whole meds with puree Supervision: Full supervision/cueing for compensatory strategies Compensations: Minimize environmental distractions;Slow rate;Small sips/bites Postural Changes and/or Swallow Maneuvers: Seated upright 90 degrees                  Oral care BID   Frequent or constant Supervision/Assistance Dysphagia, oropharyngeal phase (R13.12)     Continue with current plan of care     [name]  Graduate SLP Clinican

## 2023-12-07 NOTE — Plan of Care (Signed)

## 2023-12-08 DIAGNOSIS — I10 Essential (primary) hypertension: Secondary | ICD-10-CM | POA: Diagnosis not present

## 2023-12-08 DIAGNOSIS — I509 Heart failure, unspecified: Secondary | ICD-10-CM | POA: Diagnosis not present

## 2023-12-08 DIAGNOSIS — R0600 Dyspnea, unspecified: Secondary | ICD-10-CM | POA: Diagnosis not present

## 2023-12-08 DIAGNOSIS — I5023 Acute on chronic systolic (congestive) heart failure: Secondary | ICD-10-CM | POA: Diagnosis not present

## 2023-12-08 LAB — URINALYSIS, W/ REFLEX TO CULTURE (INFECTION SUSPECTED)
Bilirubin Urine: NEGATIVE
Glucose, UA: 50 mg/dL — AB
Hgb urine dipstick: NEGATIVE
Ketones, ur: NEGATIVE mg/dL
Nitrite: NEGATIVE
Protein, ur: NEGATIVE mg/dL
Specific Gravity, Urine: 1.02 (ref 1.005–1.030)
WBC, UA: >50 WBC/hpf (ref 0–5)
pH: 5 (ref 5.0–8.0)

## 2023-12-08 LAB — BASIC METABOLIC PANEL WITH GFR
Anion gap: 7 (ref 5–15)
BUN: 41 mg/dL — ABNORMAL HIGH (ref 8–23)
CO2: 25 mmol/L (ref 22–32)
Calcium: 8.3 mg/dL — ABNORMAL LOW (ref 8.9–10.3)
Chloride: 107 mmol/L (ref 98–111)
Creatinine, Ser: 0.88 mg/dL (ref 0.61–1.24)
GFR, Estimated: >60 mL/min (ref >60)
Glucose, Bld: 141 mg/dL — ABNORMAL HIGH (ref 70–99)
Potassium: 4.8 mmol/L (ref 3.5–5.1)
Sodium: 139 mmol/L (ref 135–145)

## 2023-12-08 MED ORDER — SODIUM CHLORIDE 0.9 % IV SOLN
1.5000 g | Freq: Three times a day (TID) | INTRAVENOUS | Status: DC
  Administered 2023-12-08 – 2023-12-09 (×2): 1.5 g via INTRAVENOUS
  Filled 2023-12-08 (×3): qty 4

## 2023-12-08 MED ORDER — URELLE 81 MG PO TABS
1.0000 | ORAL_TABLET | Freq: Three times a day (TID) | ORAL | Status: AC
Start: 2023-12-08 — End: 2023-12-11
  Administered 2023-12-08 – 2023-12-11 (×9): 81 mg via ORAL
  Filled 2023-12-08 (×9): qty 1

## 2023-12-08 MED ORDER — INFLUENZA VAC SPLIT HIGH-DOSE 0.5 ML IM SUSY
0.5000 mL | PREFILLED_SYRINGE | INTRAMUSCULAR | Status: AC
  Administered 2023-12-09: 0.5 mL via INTRAMUSCULAR
  Filled 2023-12-08: qty 0.5

## 2023-12-08 NOTE — Progress Notes (Signed)
 Occupational Therapy Treatment Patient Details Name: Guy Espinoza MRN: [id number] DOB: Feb 20, 1927 Today's Date: 12/08/2023   History of present illness Pt is a 89 y.o. male admitted 9/5 for SOB. CT showed bil pleural effusion & pulmonary nodule. Increased lethargy 9/11 PT session. PMH: HTN, asthma, pacemaker, afib, CAD, CHF, AV block   OT comments  Pt appearing more lethargic, and malaise than previous session. VSS on RA, but pt with decreased activity tolerance from previous session, only tolerating toilet hygiene and transfer to recliner with CGA. OT educated pt on use of flutter valve and incentive spirometer, pt performing x10 reps of each. Given decline, OT updated recs to <3 hours of skilled rehab daily. Will continue to follow acutely.       If plan is discharge home, recommend the following:  A little help with walking and/or transfers;A little help with bathing/dressing/bathroom;Assistance with cooking/housework   Equipment Recommendations  Tub/shower seat       Precautions / Restrictions Precautions Precautions: Fall Recall of Precautions/Restrictions: Impaired Restrictions Weight Bearing Restrictions Per Provider Order: No       Mobility Bed Mobility Overal bed mobility: Needs Assistance Bed Mobility: Supine to Sit     Supine to sit: Supervision     General bed mobility comments: S for safety with lines    Transfers Overall transfer level: Needs assistance Equipment used: Rolling walker (2 wheels) Transfers: Sit to/from Stand, Bed to chair/wheelchair/BSC Sit to Stand: Contact guard assist, Min assist     Step pivot transfers: Contact guard assist     General transfer comment: CGA for short step pivot to recliner     Balance Overall balance assessment: Needs assistance Sitting-balance support: No upper extremity supported, Feet supported Sitting balance-Leahy Scale: Fair     Standing balance support: Bilateral upper extremity supported, During  functional activity, Reliant on assistive device for balance Standing balance-Leahy Scale: Poor Standing balance comment: Reliant on BUE and external support       ADL either performed or assessed with clinical judgement   ADL Overall ADL's : Needs assistance/impaired         Upper Body Dressing : Set up;Sitting       Toilet Transfer: Contact guard assist;Rolling walker (2 wheels);Stand-pivot Statistician Details (indicate cue type and reason): Short step pivot to recliner Toileting- Clothing Manipulation and Hygiene: Total assistance;Sit to/from stand Toileting - Clothing Manipulation Details (indicate cue type and reason): Total assist d/t incontience, pt able to assist in hygiene, OT mostly completing for cleanliness     Functional mobility during ADLs: Contact guard assist;Rolling walker (2 wheels) General ADL Comments: Decreased activity tolerance from previous session    Extremity/Trunk Assessment Upper Extremity Assessment Upper Extremity Assessment: Generalized weakness   Lower Extremity Assessment Lower Extremity Assessment: Defer to PT evaluation        Vision   Vision Assessment?: No apparent visual deficits         Communication Communication Communication: Impaired Factors Affecting Communication: Hearing impaired   Cognition Arousal: Lethargic Behavior During Therapy: Flat affect Cognition: No apparent impairments             OT - Cognition Comments: Pt oriented, lethargic but alert for session, appears malaise       Following commands: Intact        Cueing   Cueing Techniques: Verbal cues        General Comments VSS on 3L, pt incontinent of BM. Educated on use incentive spirometer and flutter valve x10 reps  Pertinent Vitals/ Pain       Pain Assessment Pain Assessment: No/denies pain Pain Intervention(s): Monitored during session   Frequency  Min 2X/week        Progress Toward Goals  OT Goals(current goals can now  be found in the care plan section)  Progress towards OT goals: Progressing toward goals  Acute Rehab OT Goals Patient Stated Goal: To rest OT Goal Formulation: With patient Time For Goal Achievement: 12/17/23 Potential to Achieve Goals: Good ADL Goals Pt Will Perform Grooming: with supervision;standing Pt Will Perform Lower Body Dressing: with supervision;sitting/lateral leans;sit to/from stand Pt Will Transfer to Toilet: with supervision;regular height toilet;ambulating Pt Will Perform Toileting - Clothing Manipulation and hygiene: with supervision;sitting/lateral leans;sit to/from stand Pt Will Perform Tub/Shower Transfer: with supervision;ambulating;shower seat  Plan         AM-PAC OT 6 Clicks Daily Activity     Outcome Measure   Help from another person eating meals?: None Help from another person taking care of personal grooming?: A Little Help from another person toileting, which includes using toliet, bedpan, or urinal?: Total Help from another person bathing (including washing, rinsing, drying)?: A Little Help from another person to put on and taking off regular upper body clothing?: A Little Help from another person to put on and taking off regular lower body clothing?: A Little 6 Click Score: 17    End of Session Equipment Utilized During Treatment: Rolling walker (2 wheels);Gait belt;Oxygen  OT Visit Diagnosis: Unsteadiness on feet (R26.81);Other abnormalities of gait and mobility (R26.89);Muscle weakness (generalized) (M62.81)   Activity Tolerance Patient tolerated treatment well   Patient Left in chair;with call bell/phone within reach;with family/visitor present   Nurse Communication Mobility status        Time: 8849-8781 OT Time Calculation (min): 28 min  Charges: OT General Charges $OT Visit: 1 Visit OT Treatments $Self Care/Home Management : 23-37 mins  Guy Espinoza, OT  Acute Rehabilitation Services Office [phone] Secure chat  preferred   Guy Espinoza [employer] 12/08/2023, 1:19 PM

## 2023-12-08 NOTE — Progress Notes (Signed)
 Patient complains of pain when tries to void. Patient has pure wick on voiding amber urine.

## 2023-12-08 NOTE — TOC Initial Note (Cosign Needed Addendum)
 Transition of Care [facility]) - Initial/Assessment Note    Patient Details  Name: Guy Espinoza MRN: [id number] Date of Birth: [birth year]  Transition of [facility]) CM/SW Contact:    Whitfield Campanile, Student-Social Work Phone Number: 12/08/2023, 11:05 AM  Clinical Narrative:                  CSW and MSW Intern introduced selfs to patient and patient's wife. CSW discussed PT recommendation for short term rehab at [facility]. Patient and spouse were agreeable to SNF placement for patient. CSW explained SNF referral process. Patient will be given SNF bed offers once available.  Whitfield Campanile, MSW Intern  Expected Discharge Plan: Skilled Nursing Facility Barriers to Discharge: Continued Medical Work up   Patient Goals and CMS Choice Patient states their goals for this hospitalization and ongoing recovery are:: return home   Choice offered to / list presented to : Spouse      Expected Discharge Plan and Services In-house Referral: Clinical Social Work Discharge Planning Services: CM Consult Post Acute Care Choice: Home Health, Durable Medical Equipment Living arrangements for the past 2 months: Single Family Home                 DME Arranged: Oxygen         HH Arranged: PT, RN, OT          Prior Living Arrangements/Services Living arrangements for the past 2 months: Single Family Home Lives with:: Spouse, Self Patient language and need for interpreter reviewed:: No Do you feel safe going back to the place where you live?: Yes      Need for Family Participation in Patient Care: Yes (Comment) Care giver support system in place?: Yes (comment)   Criminal Activity/Legal Involvement Pertinent to Current Situation/Hospitalization: No - Comment as needed  Activities of Daily Living      Permission Sought/Granted Permission sought to share information with : Facility Industrial/product designer granted to share information with : Yes, Verbal Permission Granted  Share Information  with NAME: Guy Espinoza  Permission granted to share info w AGENCY: SNFs  Permission granted to share info w Relationship: Spouse  Permission granted to share info w Contact Information: [phone]  Emotional Assessment Appearance:: Appears stated age   Affect (typically observed): Frustrated Orientation: : Oriented to Self, Oriented to Place, Oriented to  Time, Oriented to Situation Alcohol / Substance Use: Not Applicable Psych Involvement: No (comment)  Admission diagnosis:  Acute exacerbation of CHF (congestive heart failure) (HCC) [I50.9] Acute decompensated heart failure (HCC) [I50.9] Dyspnea, unspecified type [R06.00] Patient Active Problem List   Diagnosis Date Noted   Protein-calorie malnutrition, severe 12/05/2023   Benign prostatic hyperplasia without urinary obstruction 12/02/2023   Disability of walking 12/02/2023   Essential hypertension 12/02/2023   Pure hypercholesterolemia 12/02/2023   Prediabetes 12/02/2023   Acute on chronic systolic CHF (congestive heart failure) (HCC) 12/02/2023   Atrial fibrillation, chronic (HCC) 12/02/2023   COPD with acute exacerbation (HCC) 12/02/2023   Hypernatremia 12/02/2023   AV block 08/24/2023   Coronary artery disease involving native coronary artery of native heart 08/24/2023   HFrEF (heart failure with reduced ejection fraction) (HCC) 08/24/2023   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/08/2018   Spinal stenosis 09/15/2014   Claudication in peripheral vascular disease (HCC) 09/13/2013   PCP:  Nanci Senior, MD Pharmacy:   [id number] [place], [place] - [address] [place] [address] Phone: [phone] Fax: [phone]     Social Drivers  of Health (SDOH) Social History: SDOH Screenings   Food Insecurity: No Food Insecurity (12/02/2023)  Housing: Low Risk  (12/02/2023)  Transportation Needs: No Transportation Needs (12/02/2023)  Utilities: Patient Declined (12/02/2023)  Social Connections: Unknown  (12/02/2023)  Tobacco Use: Low Risk  (12/01/2023)   SDOH Interventions:     Readmission Risk Interventions     No data to display

## 2023-12-08 NOTE — Progress Notes (Signed)
 [facility]                                                                              [name], is a 89 y.o. male, DOB - July 09, 1926, FMW:[id number] Admit date - 12/01/2023    Outpatient Primary MD for the patient is Nanci Senior, MD  LOS - 6  days  Chief Complaint  Patient presents with   Shortness of Breath       Brief summary   Patient is a 89 year old male with atrial fibrillation, hypertension, coronary artery disease and pulmonary hypertension, who presented with dyspnea.  Reported about 1 to 2 weeks of dyspnea on exertion, denied any chest pain, edema or palpitations. Positive cough and increased sputum production. On the day of admission he was evaluated at the EP clinic and was found hypoxemic, 87 to 88% on ambient air along with trace edema at his lower extremities. He was referred to the ED for further evaluation.  CT chest with centrilobular emphysema, small bilateral pleural effusions, with bilateral basal atelectasis. Stable 7 mm right upper lobe peri fissural subpleural pulmonary nodule, mild cardiomegaly.   Patient was placed on diuresis, bronchodilator therapy and steroids.  09/08 clinically improving, will need supplemental 02 at home.  09/09 patient with worsening increased of breathing and debility Chest radiograph with worsening right lower lobe infiltrate, likely aspiration pneumonia.   Assessment & Plan    Principal Problem:   Acute on chronic systolic CHF (congestive heart failure) (HCC) - 2D echo showed reduced LV systolic function with EF 35 to 40%, global hypokinesis, mild dilatated LV cavity, G2 DD, severe dilatation right and left atrium, moderate to severe MR, moderate TR, no aortic stenosis, trivial pericardial effusion -Continue spironolactone , losartan  and metoprolol  succinate.  - Continue to hold furosemide , stop SGLT 2 inh due to frailty  -Negative balance of 9.5 L   Acute hypoxemic respiratory failure, COPD exacerbation -  due to acute cardiogenic pulmonary edema, aspiration PNA and COPD exacerbation. -O2 sats 97 to 100% on 2 L O2 via [place] -Chest x-ray 09/09 persistent right lower lobe infiltrate.  - Aspiration pneumonia not present on admission, continue antibiotics, nebs, steroids  - Continue flutter valve and incentive spirometer.   Dysuria/urinary retention -In-N-Out cath, UA with large leukocytes few bacteria, WBC >50, currently on IV Unasyn , follow urine culture -Continue Flomax , added urine for dysuria   Essential hypertension, CAD BP stable, continue losartan , metoprolol , Aldactone    Atrial fibrillation, chronic (HCC) -Continue beta-blocker, apixaban    Hypernatremia Sodium improving, 148 on 9/5  - Continue Aldactone     Spinal stenosis, debility -PT evaluation recommended SNF, TOC consult placed     Hyperlipidemia Continue statin    Protein-calorie malnutrition, severe Nutrition Problem: Severe Malnutrition Etiology: chronic illness (CHF, dysphagia) Signs/Symptoms: severe fat depletion, severe muscle depletion Interventions: Ensure Enlive (each supplement provides 350kcal and 20 grams of protein), Magic cup  Underweight, debility Estimated body mass index is 17.22 kg/m as calculated from the following:   Height as of this encounter: 5' 9 (1.753 m).   Weight as of this encounter: 52.9 kg.  Code Status: DNR DVT Prophylaxis:  apixaban  (ELIQUIS ) tablet  2.5 mg Start: 12/02/23 1000 apixaban  (ELIQUIS ) tablet 2.5 mg   Level of Care: Level of care: Telemetry Cardiac Family Communication: Updated patient's wife at the bedside on 9/11 Disposition Plan:      Remains inpatient appropriate: PT Evaluation recommended SNF TOC assisting.  Procedures:  2D echo  Consultants:     Antimicrobials:   Anti-infectives (From admission, onward)    Start     Dose/Rate Route Frequency Ordered Stop   12/08/23 1300  Urelle  (URELLE /URISED) 81 MG tablet 81 mg        1 tablet Oral 3 times daily 12/08/23  1204 12/11/23 1559   12/05/23 2000  ampicillin -sulbactam (UNASYN ) 1.5 g in sodium chloride  0.9 % 100 mL IVPB        1.5 g 200 mL/hr over 30 Minutes Intravenous Every 12 hours 12/05/23 1952            Medications  apixaban   2.5 mg Oral BID   atorvastatin   20 mg Oral Daily   budesonide -glycopyrrolate -formoterol   2 puff Inhalation BID   diclofenac  Sodium  2 g Topical QID   feeding supplement  237 mL Oral BID BM   losartan   25 mg Oral Daily   metoprolol  succinate  25 mg Oral Daily   multivitamin with minerals  1 tablet Oral Daily   predniSONE   40 mg Oral Q breakfast   spironolactone   12.5 mg Oral Daily   tamsulosin   0.4 mg Oral QPC supper   Urelle   1 tablet Oral TID      Subjective:   Guy Espinoza was seen and examined today.  Today having issues with urinary retention, dysuria.  No fever chills or abdominal pain.  Very deconditioned.  No nausea vomiting  Objective:   Vitals:   12/07/23 2323 12/08/23 0142 12/08/23 0358 12/08/23 1119  BP: 115/74  (!) 137/94 119/81  Pulse: 69 70 70 70  Resp: 20 (!) 21 20 19   Temp: (!) 97.5 F (36.4 C)  (!) 97.5 F (36.4 C) 97.8 F (36.6 C)  TempSrc: Oral  Oral Axillary  SpO2: 98% 99% 100% 98%  Weight:  52.9 kg    Height:        Intake/Output Summary (Last 24 hours) at 12/08/2023 1205 Last data filed at 12/08/2023 1103 Gross per 24 hour  Intake 100 ml  Output 1520 ml  Net -1420 ml     Wt Readings from Last 3 Encounters:  12/08/23 52.9 kg  12/01/23 53.1 kg  11/02/23 53.1 kg   Physical Exam General: Alert and oriented, NAD, ill-appearing, frail Cardiovascular: S1 S2 clear, RRR.  Respiratory: CTAB, no wheezing Gastrointestinal: Soft, nontender, nondistended, NBS Ext: no pedal edema bilaterally Neuro: no new deficits Psych: Normal affect   Data Reviewed:  I have personally reviewed following labs    CBC Lab Results  Component Value Date   WBC 9.0 12/06/2023   RBC 3.91 (L) 12/06/2023   HGB 12.3 (L) 12/06/2023   HCT  38.0 (L) 12/06/2023   MCV 97.2 12/06/2023   MCH 31.5 12/06/2023   PLT 181 12/06/2023   MCHC 32.4 12/06/2023   RDW 13.3 12/06/2023   LYMPHSABS 1.1 04/10/2008   MONOABS 0.5 04/10/2008   EOSABS 0.2 04/10/2008   BASOSABS 0.0 04/10/2008     Last metabolic panel Lab Results  Component Value Date   NA 139 12/08/2023   K 4.8 12/08/2023   CL 107 12/08/2023   CO2 25 12/08/2023   BUN 41 (H) 12/08/2023   CREATININE 0.88 12/08/2023  GLUCOSE 141 (H) 12/08/2023   GFRNONAA >60 12/08/2023   GFRAA >60 06/16/2017   CALCIUM  8.3 (L) 12/08/2023   PROT 7.0 10/24/2023   ALBUMIN 4.5 10/24/2023   BILITOT 1.5 (H) 10/24/2023   ALKPHOS 62 10/24/2023   AST 23 10/24/2023   ALT 19 10/24/2023   ANIONGAP 7 12/08/2023    CBG (last 3)  No results for input(s): GLUCAP in the last 72 hours.    Coagulation Profile: Recent Labs  Lab 12/02/23 0423  INR 1.4*     Radiology Studies: I have personally reviewed the imaging studies  No results found.      Nydia Distance M.D. [facility] 12/08/2023, 12:05 PM  Available via Epic secure chat 7am-7pm After 7 pm, please refer to night coverage provider listed on amion.

## 2023-12-08 NOTE — Progress Notes (Signed)
 Patient unable to void since 0930. Bladder scan showed 248 ml. In and out catheter done per protocol 305 ml of urine obtained. Patient in and out catheterized 2 times on day shift. Patient tolerated well total of 625 ml of urine obtained between 7a-7p.

## 2023-12-08 NOTE — Progress Notes (Signed)
 PHARMACY NOTE:  ANTIMICROBIAL RENAL DOSAGE ADJUSTMENT  Current antimicrobial regimen includes a mismatch between antimicrobial dosage and estimated renal function.  As per policy approved by the Pharmacy & Therapeutics and Medical Executive Committees, the antimicrobial dosage will be adjusted accordingly.  Current antimicrobial dosage:  Unasyn  1.5 g IV q12h  Indication: aspiration pneumonia  Renal Function:  Estimated Creatinine Clearance: 37.6 mL/min (by C-G formula based on SCr of 0.88 mg/dL). []      On intermittent HD, scheduled: []      On CRRT    Antimicrobial dosage has been changed to:  Unasyn  1.5 g IV q8h  Additional comments:   Thank you for involving pharmacy in this patient's care.  Delon Sax, PharmD, BCPS Clinical Pharmacist Clinical phone for 12/08/2023 is [phone] 12/08/2023 9:02 PM

## 2023-12-08 NOTE — NC FL2 (Signed)
 [place]  MEDICAID FL2 LEVEL OF CARE FORM     IDENTIFICATION  Patient Name: Guy Espinoza Birthdate: 07/18/1926 Sex: male Admission Date (Current Location): 12/01/2023  [facility] and [place] Number:  Producer, television/film/video and Address:  [facility] [place], [address], [place], [place] [address]      Provider Number: [id number]  Attending Physician Name and Address:  Davia Nydia POUR, MD  Relative Name and Phone Number:  AKONI PARTON (Spouse)    Current Level of Care: Hospital Recommended Level of Care: Skilled Nursing Facility Prior Approval Number:    Date Approved/Denied:   PASRR Number: [id number]  Discharge Plan: SNF    Current Diagnoses: Patient Active Problem List   Diagnosis Date Noted   Protein-calorie malnutrition, severe 12/05/2023   Benign prostatic hyperplasia without urinary obstruction 12/02/2023   Disability of walking 12/02/2023   Essential hypertension 12/02/2023   Pure hypercholesterolemia 12/02/2023   Prediabetes 12/02/2023   Acute on chronic systolic CHF (congestive heart failure) (HCC) 12/02/2023   Atrial fibrillation, chronic (HCC) 12/02/2023   COPD with acute exacerbation (HCC) 12/02/2023   Hypernatremia 12/02/2023   AV block 08/24/2023   Coronary artery disease involving native coronary artery of native heart 08/24/2023   HFrEF (heart failure with reduced ejection fraction) (HCC) 08/24/2023   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/08/2018   Spinal stenosis 09/15/2014   Claudication in peripheral vascular disease (HCC) 09/13/2013    Orientation RESPIRATION BLADDER Height & Weight     Self, Time, Situation, Place  O2 (Nasal Cannula) Continent Weight: 116 lb 10 oz (52.9 kg) Height:  5' 9 (175.3 cm)  BEHAVIORAL SYMPTOMS/MOOD NEUROLOGICAL BOWEL NUTRITION STATUS   (High fall risk)   Incontinent    AMBULATORY STATUS COMMUNICATION OF NEEDS Skin     Verbally Other (Comment) (Ecchymosis left arm and hand, right arm and hand)                        Personal Care Assistance Level of Assistance  Bathing, Dressing, Feeding Bathing Assistance: Maximum assistance Feeding assistance: Limited assistance Dressing Assistance: Maximum assistance     Functional Limitations Info  Sight, Hearing, Speech Sight Info: Impaired (Glasses) Hearing Info: Impaired Speech Info: Adequate    SPECIAL CARE FACTORS FREQUENCY  PT (By licensed PT), OT (By licensed OT)     PT Frequency: 5 x  per week OT Frequency: 5 x per  week            Contractures Contractures Info: Not present    Additional Factors Info  Code Status, Allergies Code Status Info: DNR-Limited Allergies Info: NKA           Current Medications (12/08/2023):  This is the current hospital active medication list Current Facility-Administered Medications  Medication Dose Route Frequency Provider Last Rate Last Admin   acetaminophen  (TYLENOL ) tablet 650 mg  650 mg Oral Q6H PRN Arthea Child, MD       Or   acetaminophen  (TYLENOL ) suppository 650 mg  650 mg Rectal Q6H PRN Arthea Child, MD       albuterol  (PROVENTIL ) (2.5 MG/3ML) 0.083% nebulizer solution 2.5 mg  2.5 mg Nebulization Q2H PRN Arrien, Elidia Sieving, MD       ampicillin -sulbactam (UNASYN ) 1.5 g in sodium chloride  0.9 % 100 mL IVPB  1.5 g Intravenous Q12H Arrien, Elidia Sieving, MD 200 mL/hr at 12/08/23 1131 1.5 g at 12/08/23 1131   apixaban  (ELIQUIS ) tablet 2.5 mg  2.5 mg Oral BID  Arthea Child, MD   2.5 mg at 12/08/23 9147   atorvastatin  (LIPITOR) tablet 20 mg  20 mg Oral Daily Claiborne, Claudia, MD   20 mg at 12/08/23 1120   budesonide -glycopyrrolate -formoterol  (BREZTRI ) 160-9-4.8 MCG/ACT inhaler 2 puff  2 puff Inhalation BID Claiborne, Claudia, MD   2 puff at 12/08/23 0818   diclofenac  Sodium (VOLTAREN ) 1 % topical gel 2 g  2 g Topical QID Arrien, Mauricio Daniel, MD   2 g at 12/06/23 2024   feeding supplement (ENSURE PLUS HIGH PROTEIN) liquid 237 mL  237 mL Oral BID BM Arrien,  Mauricio Daniel, MD   237 mL at 12/08/23 1127   losartan  (COZAAR ) tablet 25 mg  25 mg Oral Daily Claiborne, Claudia, MD   25 mg at 12/08/23 1120   metoprolol  succinate (TOPROL -XL) 24 hr tablet 25 mg  25 mg Oral Daily Claiborne, Claudia, MD   25 mg at 12/08/23 1119   multivitamin with minerals tablet 1 tablet  1 tablet Oral Daily Claiborne, Claudia, MD   1 tablet at 12/08/23 1119   ondansetron  (ZOFRAN ) tablet 4 mg  4 mg Oral Q6H PRN Arthea Child, MD       Or   ondansetron  (ZOFRAN ) injection 4 mg  4 mg Intravenous Q6H PRN Arthea Child, MD       predniSONE  (DELTASONE ) tablet 40 mg  40 mg Oral Q breakfast Arrien, Elidia Sieving, MD   40 mg at 12/08/23 9147   sorbitol  70 % solution 30 mL  30 mL Oral Daily PRN Arthea Child, MD       spironolactone  (ALDACTONE ) tablet 12.5 mg  12.5 mg Oral Daily Arrien, Elidia Sieving, MD   12.5 mg at 12/08/23 1119   tamsulosin  (FLOMAX ) capsule 0.4 mg  0.4 mg Oral QPC supper Claiborne, Claudia, MD   0.4 mg at 12/07/23 1802   traMADol  (ULTRAM ) tablet 50 mg  50 mg Oral Q6H PRN Arrien, Mauricio Daniel, MD   50 mg at 12/08/23 9147     Discharge Medications: Please see discharge summary for a list of discharge medications.  Relevant Imaging Results:  Relevant Lab Results:   Additional Information SSN# [id number]  Whitfield Campanile, Student-Social Work

## 2023-12-09 ENCOUNTER — Inpatient Hospital Stay (HOSPITAL_COMMUNITY)

## 2023-12-09 DIAGNOSIS — I509 Heart failure, unspecified: Secondary | ICD-10-CM | POA: Diagnosis not present

## 2023-12-09 DIAGNOSIS — I10 Essential (primary) hypertension: Secondary | ICD-10-CM | POA: Diagnosis not present

## 2023-12-09 DIAGNOSIS — R0600 Dyspnea, unspecified: Secondary | ICD-10-CM | POA: Diagnosis not present

## 2023-12-09 DIAGNOSIS — I5023 Acute on chronic systolic (congestive) heart failure: Secondary | ICD-10-CM | POA: Diagnosis not present

## 2023-12-09 LAB — CBC
HCT: 37.7 % — ABNORMAL LOW (ref 39.0–52.0)
Hemoglobin: 12.3 g/dL — ABNORMAL LOW (ref 13.0–17.0)
MCH: 31.8 pg (ref 26.0–34.0)
MCHC: 32.6 g/dL (ref 30.0–36.0)
MCV: 97.4 fL (ref 80.0–100.0)
Platelets: 177 K/uL (ref 150–400)
RBC: 3.87 MIL/uL — ABNORMAL LOW (ref 4.22–5.81)
RDW: 13.6 % (ref 11.5–15.5)
WBC: 14.7 K/uL — ABNORMAL HIGH (ref 4.0–10.5)
nRBC: 0 % (ref 0.0–0.2)

## 2023-12-09 LAB — BRAIN NATRIURETIC PEPTIDE: B Natriuretic Peptide: 330.1 pg/mL — ABNORMAL HIGH (ref 0.0–100.0)

## 2023-12-09 MED ORDER — SODIUM CHLORIDE 0.9 % IV SOLN
2.0000 g | INTRAVENOUS | Status: DC
  Administered 2023-12-09: 2 g via INTRAVENOUS
  Filled 2023-12-09: qty 20

## 2023-12-09 MED ORDER — LEVALBUTEROL HCL 0.63 MG/3ML IN NEBU: 0.6300 mg | INHALATION_SOLUTION | Freq: Four times a day (QID) | RESPIRATORY_TRACT | Status: DC | PRN

## 2023-12-09 MED ORDER — BUDESONIDE 0.25 MG/2ML IN SUSP
0.2500 mg | Freq: Two times a day (BID) | RESPIRATORY_TRACT | Status: DC
Start: 2023-12-09 — End: 2023-12-12
  Administered 2023-12-09 – 2023-12-12 (×6): 0.25 mg via RESPIRATORY_TRACT
  Filled 2023-12-09 (×6): qty 2

## 2023-12-09 MED ORDER — IPRATROPIUM-ALBUTEROL 0.5-2.5 (3) MG/3ML IN SOLN: 3.0000 mL | Freq: Four times a day (QID) | RESPIRATORY_TRACT | Status: DC

## 2023-12-09 MED ORDER — IPRATROPIUM-ALBUTEROL 0.5-2.5 (3) MG/3ML IN SOLN
3.0000 mL | Freq: Four times a day (QID) | RESPIRATORY_TRACT | Status: DC
  Administered 2023-12-09: 3 mL via RESPIRATORY_TRACT
  Filled 2023-12-09: qty 3

## 2023-12-09 MED ORDER — IPRATROPIUM-ALBUTEROL 0.5-2.5 (3) MG/3ML IN SOLN
3.0000 mL | RESPIRATORY_TRACT | Status: DC | PRN
  Administered 2023-12-09: 3 mL via RESPIRATORY_TRACT
  Filled 2023-12-09: qty 3

## 2023-12-09 MED ORDER — ARFORMOTEROL TARTRATE 15 MCG/2ML IN NEBU
15.0000 ug | INHALATION_SOLUTION | Freq: Two times a day (BID) | RESPIRATORY_TRACT | Status: DC
  Administered 2023-12-09 – 2023-12-12 (×6): 15 ug via RESPIRATORY_TRACT
  Filled 2023-12-09 (×6): qty 2

## 2023-12-09 MED ORDER — IPRATROPIUM-ALBUTEROL 0.5-2.5 (3) MG/3ML IN SOLN
3.0000 mL | Freq: Two times a day (BID) | RESPIRATORY_TRACT | Status: DC
  Administered 2023-12-10: 3 mL via RESPIRATORY_TRACT
  Filled 2023-12-09: qty 3

## 2023-12-09 MED ORDER — FUROSEMIDE 10 MG/ML IJ SOLN
20.0000 mg | Freq: Once | INTRAMUSCULAR | Status: AC
  Administered 2023-12-09: 20 mg via INTRAVENOUS
  Filled 2023-12-09: qty 2

## 2023-12-09 MED ORDER — MORPHINE SULFATE (PF) 2 MG/ML IV SOLN
1.0000 mg | Freq: Once | INTRAVENOUS | Status: AC
  Administered 2023-12-09: 1 mg via INTRAVENOUS
  Filled 2023-12-09: qty 1

## 2023-12-09 NOTE — Plan of Care (Signed)
  Problem: Education: Goal: Knowledge of General Education information will improve Description: Including pain rating scale, medication(s)/side effects and non-pharmacologic comfort measures Outcome: Progressing   Problem: Health Behavior/Discharge Planning: Goal: Ability to manage health-related needs will improve Outcome: Progressing   Problem: Clinical Measurements: Goal: Ability to maintain clinical measurements within normal limits will improve Outcome: Progressing Goal: Will remain free from infection Outcome: Progressing   Problem: Activity: Goal: Risk for activity intolerance will decrease Outcome: Progressing   Problem: Elimination: Goal: Will not experience complications related to urinary retention Outcome: Progressing   Problem: Pain Managment: Goal: General experience of comfort will improve and/or be controlled Outcome: Progressing

## 2023-12-09 NOTE — Plan of Care (Signed)
  Problem: Clinical Measurements: Goal: Diagnostic test results will improve Outcome: Progressing   Problem: Clinical Measurements: Goal: Respiratory complications will improve Outcome: Progressing   Problem: Nutrition: Goal: Adequate nutrition will be maintained Outcome: Progressing   Problem: Coping: Goal: Level of anxiety will decrease Outcome: Progressing   Problem: Elimination: Goal: Will not experience complications related to bowel motility Outcome: Progressing Goal: Will not experience complications related to urinary retention Outcome: Progressing   Problem: Safety: Goal: Ability to remain free from injury will improve Outcome: Progressing

## 2023-12-09 NOTE — Progress Notes (Signed)
 [facility]                                                                              Guy Espinoza, is a 89 y.o. male, DOB - 03-19-1927, FMW:[id number] Admit date - 12/01/2023    Outpatient Primary MD for the patient is Guy Senior, MD  LOS - 7  days  Chief Complaint  Patient presents with   Shortness of Breath       Brief summary   Patient is a 89 year old male with atrial fibrillation, hypertension, coronary artery disease and pulmonary hypertension, who presented with dyspnea.  Reported about 1 to 2 weeks of dyspnea on exertion, denied any chest pain, edema or palpitations. Positive cough and increased sputum production. On the day of admission he was evaluated at the EP clinic and was found hypoxemic, 87 to 88% on ambient air along with trace edema at his lower extremities. He was referred to the ED for further evaluation.  CT chest with centrilobular emphysema, small bilateral pleural effusions, with bilateral basal atelectasis. Stable 7 mm right upper lobe peri fissural subpleural pulmonary nodule, mild cardiomegaly.   Patient was placed on diuresis, bronchodilator therapy and steroids.  09/08 clinically improving, will need supplemental 02 at home.  09/09 patient with worsening increased of breathing and debility Chest radiograph with worsening right lower lobe infiltrate, likely aspiration pneumonia.   Assessment & Plan    Principal Problem:   Acute on chronic systolic CHF (congestive heart failure) (HCC) - 2D echo showed reduced LV systolic function with EF 35 to 40%, global hypokinesis, mild dilatated LV cavity, G2 DD, severe dilatation right and left atrium, moderate to severe MR, moderate TR, no aortic stenosis, trivial pericardial effusion -Continue spironolactone , losartan  and metoprolol  succinate.  - Lasix  was held, SGLT 2 inh discontinued due to frailty  - Negative balance of 10.6 L however feeling short of breath today, with obtain BNP, chest  x-ray, Lasix  20 mg IV x 1   Acute hypoxemic respiratory failure, COPD exacerbation - due to acute cardiogenic pulmonary edema, aspiration PNA and COPD exacerbation. -O2 sats 97 to 100% on 2 L O2 via [place] -Chest x-ray 09/09 persistent right lower lobe infiltrate.  - Aspiration pneumonia not present on admission, continue antibiotics, nebs, steroids  - Continue flutter valve and incentive spirometer.  - Placed on scheduled Pulmicort , Brovana , DuoNebs, O2, follow chest x-ray, continue IV Rocephin  - Very frail and deconditioned, given comorbidities, no significant improvement in the last 1 week, obtain palliative consult for goals of care, discussed with patient's wife.  Dysuria/urinary retention -In-N-Out cath x3 in last 24 hours, will place Foley catheter - Urine culture showed more than 100,000 colonies of Enterococcus faecalis - Placed on IV Rocephin , follow sensitivities -Continue Flomax   Essential hypertension, CAD BP stable, continue losartan , metoprolol , Aldactone    Atrial fibrillation, chronic (HCC) -Continue beta-blocker, apixaban    Hypernatremia Sodium improving, 148 on 9/5  - Continue Aldactone     Spinal stenosis, debility -PT evaluation recommended SNF, TOC consult placed     Hyperlipidemia Continue statin    Protein-calorie malnutrition, severe Nutrition Problem: Severe Malnutrition Etiology: chronic illness (CHF, dysphagia) Signs/Symptoms: severe  fat depletion, severe muscle depletion Interventions: Ensure Enlive (each supplement provides 350kcal and 20 grams of protein), Magic cup  Underweight, debility Estimated body mass index is 17.06 kg/m as calculated from the following:   Height as of this encounter: 5' 9 (1.753 m).   Weight as of this encounter: 52.4 kg.  Code Status: DNR DVT Prophylaxis:  apixaban  (ELIQUIS ) tablet 2.5 mg Start: 12/02/23 1000 apixaban  (ELIQUIS ) tablet 2.5 mg   Level of Care: Level of care: Telemetry Cardiac Family Communication:  Updated patient's wife at the bedside today Disposition Plan:      Remains inpatient appropriate: PT Evaluation recommended SNF TOC assisting.  Procedures:  2D echo  Consultants:   Palliative medicine  Antimicrobials:   Anti-infectives (From admission, onward)    Start     Dose/Rate Route Frequency Ordered Stop   12/09/23 1400  cefTRIAXone  (ROCEPHIN ) 2 g in sodium chloride  0.9 % 100 mL IVPB        2 g 200 mL/hr over 30 Minutes Intravenous Every 24 hours 12/09/23 0844     12/08/23 2200  ampicillin -sulbactam (UNASYN ) 1.5 g in sodium chloride  0.9 % 100 mL IVPB  Status:  Discontinued        1.5 g 200 mL/hr over 30 Minutes Intravenous Every 8 hours 12/08/23 2102 12/09/23 0954   12/08/23 1300  Urelle  (URELLE /URISED) 81 MG tablet 81 mg        1 tablet Oral 3 times daily 12/08/23 1204 12/11/23 1559   12/05/23 2000  ampicillin -sulbactam (UNASYN ) 1.5 g in sodium chloride  0.9 % 100 mL IVPB  Status:  Discontinued        1.5 g 200 mL/hr over 30 Minutes Intravenous Every 12 hours 12/05/23 1952 12/08/23 2102          Medications  apixaban   2.5 mg Oral BID   arformoterol   15 mcg Nebulization BID   atorvastatin   20 mg Oral Daily   budesonide  (PULMICORT ) nebulizer solution  0.25 mg Nebulization BID   diclofenac  Sodium  2 g Topical QID   feeding supplement  237 mL Oral BID BM   furosemide   20 mg Intravenous Once   ipratropium-albuterol   3 mL Nebulization QID   losartan   25 mg Oral Daily   metoprolol  succinate  25 mg Oral Daily    morphine  injection  1 mg Intravenous Once   multivitamin with minerals  1 tablet Oral Daily   predniSONE   40 mg Oral Q breakfast   spironolactone   12.5 mg Oral Daily   tamsulosin   0.4 mg Oral QPC supper   Urelle   1 tablet Oral TID      Subjective:   [name] was seen and examined today.  Very frail and deconditioned, wife at the bedside, having some shortness of breath.  Needed In-N-Out cath x 3 in the last 24 hours, no acute chest pain, abdominal  pain.  No fevers  Objective:   Vitals:   12/09/23 0727 12/09/23 0847 12/09/23 0849 12/09/23 1119  BP: 110/72 119/82 119/82 125/83  Pulse: 73 70 70 73  Resp: 20 20  (!) 22  Temp: (!) 97.4 F (36.3 C)   97.7 F (36.5 C)  TempSrc: Oral   Oral  SpO2: 98% 99%  99%  Weight:      Height:        Intake/Output Summary (Last 24 hours) at 12/09/2023 1238 Last data filed at 12/09/2023 0530 Gross per 24 hour  Intake --  Output 825 ml  Net -825 ml  Wt Readings from Last 3 Encounters:  12/09/23 52.4 kg  12/01/23 53.1 kg  11/02/23 53.1 kg   Physical Exam General: Awake and alert, ill-appearing, frail Cardiovascular: S1 S2 clear, RRR.  Respiratory: Diminished breath sound at the bases Gastrointestinal: Soft, nontender, nondistended, NBS Ext: no pedal edema bilaterally Neuro: no new deficits Psych: Normal affect  Data Reviewed:  I have personally reviewed following labs    CBC Lab Results  Component Value Date   WBC 14.7 (H) 12/09/2023   RBC 3.87 (L) 12/09/2023   HGB 12.3 (L) 12/09/2023   HCT 37.7 (L) 12/09/2023   MCV 97.4 12/09/2023   MCH 31.8 12/09/2023   PLT 177 12/09/2023   MCHC 32.6 12/09/2023   RDW 13.6 12/09/2023   LYMPHSABS 1.1 04/10/2008   MONOABS 0.5 04/10/2008   EOSABS 0.2 04/10/2008   BASOSABS 0.0 04/10/2008     Last metabolic panel Lab Results  Component Value Date   NA 139 12/08/2023   K 4.8 12/08/2023   CL 107 12/08/2023   CO2 25 12/08/2023   BUN 41 (H) 12/08/2023   CREATININE 0.88 12/08/2023   GLUCOSE 141 (H) 12/08/2023   GFRNONAA >60 12/08/2023   GFRAA >60 06/16/2017   CALCIUM  8.3 (L) 12/08/2023   PROT 7.0 10/24/2023   ALBUMIN 4.5 10/24/2023   BILITOT 1.5 (H) 10/24/2023   ALKPHOS 62 10/24/2023   AST 23 10/24/2023   ALT 19 10/24/2023   ANIONGAP 7 12/08/2023    CBG (last 3)  No results for input(s): GLUCAP in the last 72 hours.    Coagulation Profile: No results for input(s): INR, PROTIME in the last 168  hours.    Radiology Studies: I have personally reviewed the imaging studies  No results found.      Nydia Distance M.D. [facility] 12/09/2023, 12:38 PM  Available via Epic secure chat 7am-7pm After 7 pm, please refer to night coverage provider listed on amion.

## 2023-12-10 DIAGNOSIS — Z7189 Other specified counseling: Secondary | ICD-10-CM

## 2023-12-10 DIAGNOSIS — I509 Heart failure, unspecified: Secondary | ICD-10-CM | POA: Diagnosis not present

## 2023-12-10 DIAGNOSIS — R0600 Dyspnea, unspecified: Secondary | ICD-10-CM | POA: Diagnosis not present

## 2023-12-10 DIAGNOSIS — I10 Essential (primary) hypertension: Secondary | ICD-10-CM | POA: Diagnosis not present

## 2023-12-10 DIAGNOSIS — Z515 Encounter for palliative care: Secondary | ICD-10-CM | POA: Diagnosis not present

## 2023-12-10 DIAGNOSIS — I5023 Acute on chronic systolic (congestive) heart failure: Secondary | ICD-10-CM | POA: Diagnosis not present

## 2023-12-10 LAB — BASIC METABOLIC PANEL WITH GFR
Anion gap: 11 (ref 5–15)
BUN: 40 mg/dL — ABNORMAL HIGH (ref 8–23)
CO2: 26 mmol/L (ref 22–32)
Calcium: 8.4 mg/dL — ABNORMAL LOW (ref 8.9–10.3)
Chloride: 104 mmol/L (ref 98–111)
Creatinine, Ser: 0.81 mg/dL (ref 0.61–1.24)
GFR, Estimated: >60 mL/min (ref >60)
Glucose, Bld: 101 mg/dL — ABNORMAL HIGH (ref 70–99)
Potassium: 4.7 mmol/L (ref 3.5–5.1)
Sodium: 141 mmol/L (ref 135–145)

## 2023-12-10 LAB — URINE CULTURE: Culture: >=100000 — AB

## 2023-12-10 MED ORDER — CHLORHEXIDINE GLUCONATE CLOTH 2 % EX PADS
6.0000 | MEDICATED_PAD | Freq: Every day | CUTANEOUS | Status: DC
  Administered 2023-12-10 – 2023-12-12 (×3): 6 via TOPICAL

## 2023-12-10 MED ORDER — AMPICILLIN SODIUM 500 MG IJ SOLR
500.0000 mg | Freq: Four times a day (QID) | INTRAMUSCULAR | Status: DC
  Filled 2023-12-10 (×2): qty 2

## 2023-12-10 MED ORDER — SODIUM CHLORIDE 0.9 % IV SOLN
2.0000 g | Freq: Four times a day (QID) | INTRAVENOUS | Status: DC
  Administered 2023-12-10 – 2023-12-12 (×7): 2 g via INTRAVENOUS
  Filled 2023-12-10 (×8): qty 2000

## 2023-12-10 MED ORDER — AMPICILLIN SODIUM 500 MG IJ SOLR
500.0000 mg | Freq: Three times a day (TID) | INTRAMUSCULAR | Status: DC
  Administered 2023-12-10: 500 mg via INTRAVENOUS
  Filled 2023-12-10 (×4): qty 2

## 2023-12-10 MED ORDER — MORPHINE SULFATE (PF) 2 MG/ML IV SOLN
1.0000 mg | INTRAVENOUS | Status: DC | PRN
  Administered 2023-12-10: 1 mg via INTRAVENOUS
  Filled 2023-12-10: qty 1

## 2023-12-10 NOTE — TOC Progression Note (Addendum)
 Transition of Care [facility]) - Initial/Assessment Note    Patient Details  Name: Guy Espinoza MRN: [id number] Date of Birth: 30-Mar-1926  Transition of Care [facility]) CM/SW Contact:    Britt JULIANNA Bennetts, LCSW Phone Number: 12/10/2023, 12:38 PM  Clinical Narrative:                 CSW spoke with pt's spouse and the facility of choice is [facility].   CSW contacted Tonya in admissions at [facility] to secure bed offer.  Bascom will review referral and inform CSW if SNF bed is still available.    1400-  CSW received a returned call from Tonya at [facility].  The SNF can accept the patient when medically ready pending insurance authorization is approved.  Insurance authorization has been requested and is pending.  Reference number= [id number]  TOC will continue to follow.    Expected Discharge Plan: Skilled Nursing Facility Barriers to Discharge: Continued Medical Work up   Patient Goals and CMS Choice Patient states their goals for this hospitalization and ongoing recovery are:: return home   Choice offered to / list presented to : Spouse      Expected Discharge Plan and Services In-house Referral: Clinical Social Work Discharge Planning Services: CM Consult Post Acute Care Choice: Home Health, Durable Medical Equipment Living arrangements for the past 2 months: Single Family Home                 DME Arranged: Oxygen         HH Arranged: PT, RN, OT          Prior Living Arrangements/Services Living arrangements for the past 2 months: Single Family Home Lives with:: Spouse, Self Patient language and need for interpreter reviewed:: No Do you feel safe going back to the place where you live?: Yes      Need for Family Participation in Patient Care: Yes (Comment) Care giver support system in place?: Yes (comment)   Criminal Activity/Legal Involvement Pertinent to Current Situation/Hospitalization: No - Comment as needed  Activities of Daily Living   ADL Screening (condition at time  of admission) Independently performs ADLs?: No Does the patient have a NEW difficulty with bathing/dressing/toileting/self-feeding that is expected to last >3 days?: No Does the patient have a NEW difficulty with getting in/out of bed, walking, or climbing stairs that is expected to last >3 days?: No Does the patient have a NEW difficulty with communication that is expected to last >3 days?: No Is the patient deaf or have difficulty hearing?: No Does the patient have difficulty seeing, even when wearing glasses/contacts?: No Does the patient have difficulty concentrating, remembering, or making decisions?: No  Permission Sought/Granted Permission sought to share information with : Facility Industrial/product designer granted to share information with : Yes, Verbal Permission Granted  Share Information with NAME: DURRELL BARAJAS  Permission granted to share info w AGENCY: SNFs  Permission granted to share info w Relationship: Spouse  Permission granted to share info w Contact Information: [phone]  Emotional Assessment Appearance:: Appears stated age   Affect (typically observed): Frustrated Orientation: : Oriented to Self, Oriented to Place, Oriented to  Time, Oriented to Situation Alcohol / Substance Use: Not Applicable Psych Involvement: No (comment)  Admission diagnosis:  Acute exacerbation of CHF (congestive heart failure) (HCC) [I50.9] Acute decompensated heart failure (HCC) [I50.9] Dyspnea, unspecified type [R06.00] Patient Active Problem List   Diagnosis Date Noted   Protein-calorie malnutrition, severe 12/05/2023   Benign prostatic hyperplasia without urinary  obstruction 12/02/2023   Disability of walking 12/02/2023   Essential hypertension 12/02/2023   Pure hypercholesterolemia 12/02/2023   Prediabetes 12/02/2023   Acute on chronic systolic CHF (congestive heart failure) (HCC) 12/02/2023   Atrial fibrillation, chronic (HCC) 12/02/2023   COPD with acute  exacerbation (HCC) 12/02/2023   Hypernatremia 12/02/2023   AV block 08/24/2023   Coronary artery disease involving native coronary artery of native heart 08/24/2023   HFrEF (heart failure with reduced ejection fraction) (HCC) 08/24/2023   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/08/2018   Spinal stenosis 09/15/2014   Claudication in peripheral vascular disease (HCC) 09/13/2013   PCP:  Nanci Senior, MD Pharmacy:   [id number] [address], [place] - [address] [place] [address] Phone: [phone] Fax: [phone]     Social Drivers of Health (SDOH) Social History: SDOH Screenings   Food Insecurity: No Food Insecurity (12/02/2023)  Housing: Low Risk  (12/02/2023)  Transportation Needs: No Transportation Needs (12/02/2023)  Utilities: Patient Declined (12/02/2023)  Social Connections: Unknown (12/02/2023)  Tobacco Use: Low Risk  (12/01/2023)   SDOH Interventions:     Readmission Risk Interventions     No data to display

## 2023-12-10 NOTE — Progress Notes (Signed)
 [facility]                                                                              Guy Espinoza, is a 89 y.o. male, DOB - September 28, 1926, FMW:[id number] Admit date - 12/01/2023    Outpatient Primary MD for the patient is Nanci Senior, MD  LOS - 8  days  Chief Complaint  Patient presents with   Shortness of Breath       Brief summary   Patient is a 89 year old male with atrial fibrillation, hypertension, coronary artery disease and pulmonary hypertension, who presented with dyspnea.  Reported about 1 to 2 weeks of dyspnea on exertion, denied any chest pain, edema or palpitations. Positive cough and increased sputum production. On the day of admission he was evaluated at the EP clinic and was found hypoxemic, 87 to 88% on ambient air along with trace edema at his lower extremities. He was referred to the ED for further evaluation.  CT chest with centrilobular emphysema, small bilateral pleural effusions, with bilateral basal atelectasis. Stable 7 mm right upper lobe peri fissural subpleural pulmonary nodule, mild cardiomegaly.   Patient was placed on diuresis, bronchodilator therapy and steroids.  09/08 clinically improving, will need supplemental 02 at home.  09/09 patient with worsening increased of breathing and debility Chest radiograph with worsening right lower lobe infiltrate, likely aspiration pneumonia.   Assessment & Plan    Principal Problem:   Acute on chronic systolic CHF (congestive heart failure) (HCC) - 2D echo showed reduced LV systolic function with EF 35 to 40%, global hypokinesis, mild dilatated LV cavity, G2 DD, severe dilatation right and left atrium, moderate to severe MR, moderate TR, no aortic stenosis, trivial pericardial effusion -Continue spironolactone , losartan  and metoprolol  succinate.  - Lasix  was held, SGLT 2 inh discontinued due to frailty  - Feeling better today with breathing, negative balance of 11.5 L, received Lasix  20 mg IV x 1  on 9/13    Acute hypoxemic respiratory failure, COPD exacerbation - due to acute cardiogenic pulmonary edema, aspiration PNA and COPD exacerbation. -O2 sats 97 to 100% on 2 L O2 via [place] -Chest x-ray 09/09 persistent right lower lobe infiltrate.  - Aspiration pneumonia not present on admission, continue antibiotics, nebs, steroids  - Continue flutter valve and incentive spirometer.  -Continue Pulmicort , Brovana , DuoNebs, O2, IV Rocephin  - Somewhat appears better today - Palliative medicine consulted for GOC   Dysuria/urinary retention -In-N-Out cath x3 in last 24 hours, will place Foley catheter - Urine culture showed more than 100,000 colonies of Enterococcus faecalis, sensitive to ampicillin  -Continue Flomax  - ampicillin  IV versus p.o. per pharmacy  Essential hypertension, CAD BP stable, continue losartan , metoprolol , Aldactone    Atrial fibrillation, chronic (HCC) -Continue beta-blocker, apixaban    Hypernatremia Sodium improving, 148 on 9/5  - Continue Aldactone     Spinal stenosis, debility -PT evaluation recommended SNF, TOC consult placed     Hyperlipidemia Continue statin    Protein-calorie malnutrition, severe Nutrition Problem: Severe Malnutrition Etiology: chronic illness (CHF, dysphagia) Signs/Symptoms: severe fat depletion, severe muscle depletion Interventions: Ensure Enlive (each supplement provides 350kcal and 20 grams of protein), Magic cup  Underweight, debility Estimated body mass index is 17.09 kg/m as calculated from the following:   Height as of this encounter: 5' 9 (1.753 m).   Weight as of this encounter: 52.5 kg.  Code Status: DNR DVT Prophylaxis:  apixaban  (ELIQUIS ) tablet 2.5 mg Start: 12/02/23 1000 apixaban  (ELIQUIS ) tablet 2.5 mg   Level of Care: Level of care: Telemetry Cardiac Family Communication: Updated patient's wife at the bedside on 9/13 Disposition Plan:      Remains inpatient appropriate: PT Evaluation recommended SNF TOC  assisting.  Procedures:  2D echo  Consultants:   Palliative medicine  Antimicrobials:   Anti-infectives (From admission, onward)    Start     Dose/Rate Route Frequency Ordered Stop   12/09/23 1400  cefTRIAXone  (ROCEPHIN ) 2 g in sodium chloride  0.9 % 100 mL IVPB        2 g 200 mL/hr over 30 Minutes Intravenous Every 24 hours 12/09/23 0844     12/08/23 2200  ampicillin -sulbactam (UNASYN ) 1.5 g in sodium chloride  0.9 % 100 mL IVPB  Status:  Discontinued        1.5 g 200 mL/hr over 30 Minutes Intravenous Every 8 hours 12/08/23 2102 12/09/23 0954   12/08/23 1300  Urelle  (URELLE /URISED) 81 MG tablet 81 mg        1 tablet Oral 3 times daily 12/08/23 1204 12/11/23 1559   12/05/23 2000  ampicillin -sulbactam (UNASYN ) 1.5 g in sodium chloride  0.9 % 100 mL IVPB  Status:  Discontinued        1.5 g 200 mL/hr over 30 Minutes Intravenous Every 12 hours 12/05/23 1952 12/08/23 2102          Medications  apixaban   2.5 mg Oral BID   arformoterol   15 mcg Nebulization BID   atorvastatin   20 mg Oral Daily   budesonide  (PULMICORT ) nebulizer solution  0.25 mg Nebulization BID   Chlorhexidine  Gluconate Cloth  6 each Topical Q0600   diclofenac  Sodium  2 g Topical QID   feeding supplement  237 mL Oral BID BM   losartan   25 mg Oral Daily   metoprolol  succinate  25 mg Oral Daily   multivitamin with minerals  1 tablet Oral Daily   predniSONE   40 mg Oral Q breakfast   spironolactone   12.5 mg Oral Daily   tamsulosin   0.4 mg Oral QPC supper   Urelle   1 tablet Oral TID      Subjective:   Guy Espinoza was seen and examined today.  Frail and deconditioned, however states shortness of breath is somewhat better today.  No acute chest pain, abdominal pain, nausea or vomiting.  No fevers  Objective:   Vitals:   12/10/23 0334 12/10/23 0400 12/10/23 0800 12/10/23 0826  BP:  (!) 113/98 92/60   Pulse: 70 69 71 72  Resp: 20 (!) 21 20 (!) 21  Temp:  98.6 F (37 C) 98.6 F (37 C)   TempSrc:  Axillary  Oral   SpO2: 97% 97% 97% 98%  Weight: 52.5 kg     Height:        Intake/Output Summary (Last 24 hours) at 12/10/2023 1203 Last data filed at 12/10/2023 0815 Gross per 24 hour  Intake 580 ml  Output 1700 ml  Net -1120 ml     Wt Readings from Last 3 Encounters:  12/10/23 52.5 kg  12/01/23 53.1 kg  11/02/23 53.1 kg   Physical Exam General: Alert and oriented, NAD, frail, ill-appearing Cardiovascular: S1 S2 clear, RRR.  Respiratory: Decreased  BS at the bases Gastrointestinal: Soft, nontender, nondistended, NBS Ext: no pedal edema bilaterally Neuro: no new deficits Psych: Normal affect    Data Reviewed:  I have personally reviewed following labs    CBC Lab Results  Component Value Date   WBC 14.7 (H) 12/09/2023   RBC 3.87 (L) 12/09/2023   HGB 12.3 (L) 12/09/2023   HCT 37.7 (L) 12/09/2023   MCV 97.4 12/09/2023   MCH 31.8 12/09/2023   PLT 177 12/09/2023   MCHC 32.6 12/09/2023   RDW 13.6 12/09/2023   LYMPHSABS 1.1 04/10/2008   MONOABS 0.5 04/10/2008   EOSABS 0.2 04/10/2008   BASOSABS 0.0 04/10/2008     Last metabolic panel Lab Results  Component Value Date   NA 141 12/10/2023   K 4.7 12/10/2023   CL 104 12/10/2023   CO2 26 12/10/2023   BUN 40 (H) 12/10/2023   CREATININE 0.81 12/10/2023   GLUCOSE 101 (H) 12/10/2023   GFRNONAA >60 12/10/2023   GFRAA >60 06/16/2017   CALCIUM  8.4 (L) 12/10/2023   PROT 7.0 10/24/2023   ALBUMIN 4.5 10/24/2023   BILITOT 1.5 (H) 10/24/2023   ALKPHOS 62 10/24/2023   AST 23 10/24/2023   ALT 19 10/24/2023   ANIONGAP 11 12/10/2023    CBG (last 3)  No results for input(s): GLUCAP in the last 72 hours.    Coagulation Profile: No results for input(s): INR, PROTIME in the last 168 hours.    Radiology Studies: I have personally reviewed the imaging studies  DG CHEST PORT 1 VIEW Result Date: 12/09/2023 CLINICAL DATA:  Shortness of breath EXAM: PORTABLE CHEST 1 VIEW COMPARISON:  Film from earlier in the same day.  FINDINGS: Cardiac shadow is stable. Pacing device is again seen. Aortic calcifications are noted. The lungs are well aerated bilaterally. Increasing airspace opacity in the right base is noted consistent with developing infiltrate. IMPRESSION: Increasing right basilar infiltrate. Electronically Signed   By: Oneil Devonshire M.D.   On: 12/09/2023 21:05   DG CHEST PORT 1 VIEW Result Date: 12/09/2023 CLINICAL DATA:  Dyspnea EXAM: PORTABLE CHEST 1 VIEW COMPARISON:  12/05/2023 FINDINGS: Dual lead pacer noted. Atherosclerotic calcification of the aortic arch. The patient is rotated to the right on today's radiograph, reducing diagnostic sensitivity and specificity. Substantial degenerative glenohumeral arthropathy bilaterally. Upper normal cardiac size. Improved aeration at the right lung base. Equivocal blunting of the right lateral costophrenic angle, cannot exclude trace right pleural effusion. IMPRESSION: 1. Improved aeration at the right lung base. 2. Equivocal blunting of the right lateral costophrenic angle, cannot exclude trace right pleural effusion. 3. Substantial degenerative glenohumeral arthropathy bilaterally. 4. Aortic Atherosclerosis (ICD10-I70.0). Electronically Signed   By: Ryan Salvage M.D.   On: 12/09/2023 12:54        Davonte Siebenaler M.D. [facility] 12/10/2023, 12:03 PM  Available via Epic secure chat 7am-7pm After 7 pm, please refer to night coverage provider listed on amion.

## 2023-12-10 NOTE — Plan of Care (Signed)
  Problem: Clinical Measurements: Goal: Diagnostic test results will improve Outcome: Progressing   Problem: Clinical Measurements: Goal: Respiratory complications will improve Outcome: Progressing   Problem: Nutrition: Goal: Adequate nutrition will be maintained Outcome: Progressing   Problem: Coping: Goal: Level of anxiety will decrease Outcome: Progressing   Problem: Safety: Goal: Ability to remain free from injury will improve Outcome: Progressing   Problem: Skin Integrity: Goal: Risk for impaired skin integrity will decrease Outcome: Progressing

## 2023-12-10 NOTE — Plan of Care (Signed)
  Problem: Clinical Measurements: Goal: Ability to maintain clinical measurements within normal limits will improve Outcome: Progressing Goal: Will remain free from infection Outcome: Progressing Goal: Diagnostic test results will improve Outcome: Progressing Goal: Respiratory complications will improve Outcome: Progressing Goal: Cardiovascular complication will be avoided Outcome: Progressing   Problem: Coping: Goal: Level of anxiety will decrease Outcome: Progressing   Problem: Pain Managment: Goal: General experience of comfort will improve and/or be controlled Outcome: Progressing   Problem: Safety: Goal: Ability to remain free from injury will improve Outcome: Progressing

## 2023-12-10 NOTE — Consult Note (Signed)
 Palliative Care Consult Note                                  Date: 12/10/2023   Patient Name: Guy Espinoza  DOB: March 04, 1927  MRN: [id number]  Age / Sex: 89 y.o., male  PCP: Nanci Senior, MD Referring Physician: Davia Nydia POUR, MD  Reason for Consultation: Establishing goals of care  HPI/Patient Profile: 89 y.o. male  with past medical history of chronic systolic CHF, atrial fibrillation, coronary artery disease, pulmonary hypertension, asthma, and spinal stenosis who presented to the ED on 12/01/2023 with dyspnea and was admitted with acute on chronic CHF.  Patient was treated with diuretics, bronchodilator, and steroids.  By 9/8, patient was clinically improving and would need supplemental O2 at home.  On 9/9, patient with increased work of breathing and chest x-ray with worsening right lower lobe infiltrate, likely aspiration pneumonia.  Palliative Medicine has been consulted for goals of care discussions. Patient and family are faced with anticipatory care needs and complex medical decision making.   Clinical Assessment and Goals of Care:   Extensive chart review has been completed prior to meeting with patient/family including labs, vital signs, imaging, progress/consult notes, orders, medications and available advance directive documents.    Patient is hospitalized with a severe exacerbation of CHF, as well as aspiration pneumonia.  I reviewed echocardiogram 08/01/2023 which showed LVEF 35 to 40%, grade 2 diastolic dysfunction, moderate to severe MR, and moderate TR.  I met with patient and his wife Guy Espinoza to discuss diagnosis, prognosis, GOC, EOL wishes, disposition, and options.  Patient appears quite uncomfortable, and is unable to participate in conversation due to his profound dyspnea.  I introduced Palliative Medicine as specialized medical care for people living with serious illness. It focuses on providing relief from the  symptoms and stress of a serious illness.   Created space and opportunity for patient and family to express thoughts and feelings regarding current medical situation. Values and goals of care were attempted to be elicited.  Life Review: Patient and wife have been married 71 years. They have one daughter and 3 grandchildren.   Discussion: We discussed patient's current illness and what it means in the larger context of his ongoing co-morbidities. Current clinical status was reviewed.  Discussed natural trajectory of advanced heart failure, as a progressive and life limiting disease.  Wife states that patient's health has been going downhill.  I shared my concern that patient may be approaching end-of-life.  Offered education on the limitations of medical interventions to prolong quality of life when the body fails to thrive.  Wife is understandably tearful and emotional.  She shares that patient has expressed he wants to go peacefully.  I encouraged consideration of a comfort path, to focus on symptom management while allowing a natural course to occur.  For now, patient and wife are agreeable to try PRN opioids to relieve patient's dyspnea.  Wife wants to speak with her daughter, and we plan for additional GOC discussion tomorrow.  Questions and concerns addressed.  Emotional support provided.  Hard choices book provided.   Review of Systems  Respiratory:  Positive for shortness of breath.     Objective:   Primary Diagnoses: Present on Admission:  Coronary artery disease involving native coronary artery of native heart  Essential hypertension  Spinal stenosis  Pure hypercholesterolemia   Physical Exam Vitals reviewed.  Constitutional:  General: He is in acute distress.     Comments: Frail and chronically ill-appearing  Pulmonary:     Effort: Tachypnea and accessory muscle usage present.  Neurological:     Mental Status: He is alert.    Vital Signs:  BP 106/72  (BP Location: Right Arm)   Pulse 68   Temp 98.4 F (36.9 C) (Oral)   Resp (!) 22   Ht 5' 9 (1.753 m)   Wt 52.5 kg   SpO2 97%   BMI 17.09 kg/m   Palliative Assessment/Data: PPS 30%     Assessment & Plan:   SUMMARY OF RECOMMENDATIONS   DNR with limited interventions Continue current supportive interventions Plan for follow-up tomorrow  Primary Decision Maker: PATIENT with support from wife  Existing Vynca/ACP Documentation: None  Symptom Management:  Morphine  1 mg IV every 3 hours as needed for pain or dyspnea  Prognosis:  Unable to determine  Discharge Planning:  To Be Determined   Discussed with: Dr. Davia and RN   Thank you for allowing us  to participate in the care of [facility]  Billing based on MDM: High  Problems Addressed: One or more chronic illnesses with severe exacerbation, progression, or side effects of treatment.  Amount and/or Complexity of Data: Category 1:Review of prior external note(s) from each unique source and Assessment requiring an independent historian(s) and Category 2:Independent interpretation of a test performed by another physician/other qualified health care professional (not separately reported)  Risks: Parenteral controlled substances and Decision not to resuscitate or to de-escalate care because of poor prognosis   Signed by: Recardo Loll, NP Palliative Medicine Team  Team Phone # [phone]  For individual providers, please see AMION

## 2023-12-11 ENCOUNTER — Ambulatory Visit: Admitting: General Practice

## 2023-12-11 DIAGNOSIS — Z515 Encounter for palliative care: Secondary | ICD-10-CM | POA: Diagnosis not present

## 2023-12-11 DIAGNOSIS — Z23 Encounter for immunization: Secondary | ICD-10-CM | POA: Diagnosis not present

## 2023-12-11 DIAGNOSIS — Z66 Do not resuscitate: Secondary | ICD-10-CM | POA: Diagnosis not present

## 2023-12-11 DIAGNOSIS — I2510 Atherosclerotic heart disease of native coronary artery without angina pectoris: Secondary | ICD-10-CM | POA: Diagnosis not present

## 2023-12-11 DIAGNOSIS — R0600 Dyspnea, unspecified: Secondary | ICD-10-CM | POA: Diagnosis not present

## 2023-12-11 DIAGNOSIS — I509 Heart failure, unspecified: Secondary | ICD-10-CM | POA: Diagnosis not present

## 2023-12-11 DIAGNOSIS — I5023 Acute on chronic systolic (congestive) heart failure: Secondary | ICD-10-CM | POA: Diagnosis not present

## 2023-12-11 DIAGNOSIS — Z7189 Other specified counseling: Secondary | ICD-10-CM | POA: Diagnosis not present

## 2023-12-11 DIAGNOSIS — E43 Unspecified severe protein-calorie malnutrition: Secondary | ICD-10-CM | POA: Diagnosis not present

## 2023-12-11 DIAGNOSIS — I110 Hypertensive heart disease with heart failure: Secondary | ICD-10-CM | POA: Diagnosis not present

## 2023-12-11 LAB — CBC
HCT: 40.4 % (ref 39.0–52.0)
Hemoglobin: 13 g/dL (ref 13.0–17.0)
MCH: 31.6 pg (ref 26.0–34.0)
MCHC: 32.2 g/dL (ref 30.0–36.0)
MCV: 98.1 fL (ref 80.0–100.0)
Platelets: 192 K/uL (ref 150–400)
RBC: 4.12 MIL/uL — ABNORMAL LOW (ref 4.22–5.81)
RDW: 13.4 % (ref 11.5–15.5)
WBC: 12.9 K/uL — ABNORMAL HIGH (ref 4.0–10.5)
nRBC: 0 % (ref 0.0–0.2)

## 2023-12-11 NOTE — Progress Notes (Signed)
 Palliative Medicine Progress Note   Patient Name: Guy Espinoza       Date: 12/11/2023 DOB: 02-11-1927  Age: 89 y.o. MRN#: [id number] Attending Physician: Davia Nydia POUR, MD Primary Care Physician: Nanci Senior, MD Admit Date: 12/01/2023    HPI/Patient Profile: 89 y.o. male  with past medical history of chronic systolic CHF, atrial fibrillation, coronary artery disease, pulmonary hypertension, asthma, and spinal stenosis who presented to the ED on 12/01/2023 with dyspnea and was admitted with acute on chronic CHF.  Patient was treated with diuretics, bronchodilator, and steroids.  By 9/8, patient was clinically improving and would need supplemental O2 at home.  On 9/9, patient with increased work of breathing and chest x-ray with worsening right lower lobe infiltrate, likely aspiration pneumonia.   Palliative Medicine has been consulted for goals of care discussions. Patient and family are faced with anticipatory care needs and complex medical decision making.    Subjective: Chart reviewed. Update received from RN.  Patient assessed.  He is currently resting in bed, and appears comfortable.  I spoke with his wife/Guy Espinoza at bedside in follow-up for palliative care needs and emotional support.  She feels that patient's condition has improved today.  She reports that he has ate/drank well and was motivated to work with PT.  We discussed potential disposition plan for rehab to allow improvement in functional status.  Wife agrees this is appropriate for now.  However I did encourage wife to consider the addition of hospice support at home in the future if patient continues to decline.   Objective:  Physical Exam Vitals reviewed.  Constitutional:      General: He is not in acute distress.     Comments: Frail and chronically ill-appearing  Cardiovascular:     Rate and Rhythm: Normal rate.  Pulmonary:     Effort: Pulmonary effort is normal.              Palliative Medicine Assessment & Plan   Assessment: Principal Problem:   Acute on chronic systolic CHF (congestive heart failure) (HCC) Active Problems:   Coronary artery disease involving native coronary artery of native heart   Essential hypertension   Spinal stenosis   Pure hypercholesterolemia   Atrial fibrillation, chronic (HCC)   COPD with acute exacerbation (HCC)   Hypernatremia   Protein-calorie malnutrition, severe  Recommendations/Plan: DNR with limited interventions Continue current supportive interventions Plan for follow-up tomorrow  Primary Decision Maker: PATIENT with support from wife  Existing Vynca/ACP Documentation: None  Symptom Management:  Morphine  1 mg IV every 3 hours as needed for pain or dyspnea   Prognosis:  Unable to determine  Discharge Planning:  To Be Determined   Care plan was discussed with Dr. Davia, RN, and [facility] team  Thank you for allowing the Palliative Medicine Team to assist in the care of this patient.   Time: 38 minutes   Recardo KATHEE Loll, NP   Please contact Palliative Medicine Team phone at [phone] for questions and concerns.  For individual providers, please see AMION.

## 2023-12-11 NOTE — Progress Notes (Signed)
 Mobility Specialist Progress Note:    12/11/23 1412  Mobility  Activity Pivoted/transferred from bed to chair  Level of Assistance Minimal assist, patient does 75% or more  Assistive Device Front wheel walker  Distance Ambulated (ft) 2 ft  Activity Response Tolerated well  Mobility Referral Yes  Mobility visit 1 Mobility  Mobility Specialist Start Time (ACUTE ONLY) 1404  Mobility Specialist Stop Time (ACUTE ONLY) 1412  Mobility Specialist Time Calculation (min) (ACUTE ONLY) 8 min   Pt received in chair, requesting to return to bed. Tolerated well, VSS stable. MinA with RW to stand. Pt lying in bed with all needs met, call bell and family in room.  Ibrahim Mcpheeters Mobility Specialist Please contact via Special educational needs teacher or  Rehab office at [phone]

## 2023-12-11 NOTE — Plan of Care (Signed)
 RN reported patient has 15 run of V. tach.  Getting an EKG. Patient has history of paroxysmal atrial fibrillation currently on beta-blocker and apixaban .   This patient is 89 year old being admitted for acute on chronic CHF exacerbation and acute on chronic hypoxic respiratory failure in the context of severe PAD and CHF exacerbation.

## 2023-12-11 NOTE — Progress Notes (Signed)
 This chaplain responded to the unit consult for spiritual care. The chaplain closed the consult for creating the Pt. Advance Directive as the Pt. wife meets with PMT NP-Julia for GOC.   The Pt. wife-[name] is visiting at the bedside. The Pt. is resting comfortably as [name] tells the story of the couple's many years of marriage, careers, and family. The chaplain listened reflectively to [name] share the importance of the Pt. remaining comfortable and her role as his wife and decision maker. The chaplain understands [name] is beginning to think about what comes next. The Pt. and [name] are supported by their daughter.  The chaplain provided education on chaplain support for the Pt. and [name].  Chaplain Leeroy Hummer [phone]

## 2023-12-11 NOTE — TOC Progression Note (Signed)
 Transition of Care [facility]) - Progression Note    Patient Details  Name: SASAN WILKIE MRN: [id number] Date of Birth: 1926-07-08  [facility] of [facility] CM/SW Contact  Montie LOISE Louder, [place] Phone Number: 12/11/2023, 2:26 PM  Clinical Narrative:     Sent updated clinicals to insurance- awaiting decision   Montie Louder, MSW, LCSW Clinical Social Worker    Expected Discharge Plan: Skilled Nursing Facility Barriers to Discharge: English as a second language teacher, Continued Medical Work up               Expected Discharge Plan and Services In-house Referral: Clinical Social Work Discharge Planning Services: [employer] Consult Post Acute Care Choice: Home Health, Durable Medical Equipment Living arrangements for the past 2 months: Single Family Home                 DME Arranged: Oxygen         HH Arranged: PT, RN, OT           Social Drivers of Health (SDOH) Interventions SDOH Screenings   Food Insecurity: No Food Insecurity (12/02/2023)  Housing: Low Risk  (12/02/2023)  Transportation Needs: No Transportation Needs (12/02/2023)  Utilities: Patient Declined (12/02/2023)  Social Connections: Unknown (12/02/2023)  Tobacco Use: Low Risk  (12/01/2023)    Readmission Risk Interventions     No data to display

## 2023-12-11 NOTE — Progress Notes (Signed)
 [facility]                                                                              Guy Espinoza, is a 89 y.o. male, DOB - 1926/05/22, FMW:[id number] Admit date - 12/01/2023    Outpatient Primary MD for the patient is Guy Senior, MD  LOS - 9  days  Chief Complaint  Patient presents with   Shortness of Breath       Brief summary   Patient is a 89 year old male with atrial fibrillation, hypertension, coronary artery disease and pulmonary hypertension, who presented with dyspnea.  Reported about 1 to 2 weeks of dyspnea on exertion, denied any chest pain, edema or palpitations. Positive cough and increased sputum production. On the day of admission he was evaluated at the EP clinic and was found hypoxemic, 87 to 88% on ambient air along with trace edema at his lower extremities. He was referred to the ED for further evaluation.  CT chest with centrilobular emphysema, small bilateral pleural effusions, with bilateral basal atelectasis. Stable 7 mm right upper lobe peri fissural subpleural pulmonary nodule, mild cardiomegaly.   Patient was placed on diuresis, bronchodilator therapy and steroids.  09/08 clinically improving, will need supplemental 02 at home.  09/09 patient with worsening increased of breathing and debility Chest radiograph with worsening right lower lobe infiltrate, likely aspiration pneumonia.   Assessment & Plan    Principal Problem:   Acute on chronic systolic CHF (congestive heart failure) (HCC) - 2D echo showed reduced LV systolic function with EF 35 to 40%, global hypokinesis, mild dilatated LV cavity, G2 DD, severe dilatation right and left atrium, moderate to severe MR, moderate TR, no aortic stenosis, trivial pericardial effusion -Continue spironolactone , losartan  and metoprolol  succinate.  - Lasix  was held, SGLT 2 inh discontinued due to frailty  - Currently feels okay, negative balance of 12.2 L, no acute shortness of breath.  Received 20  mg IV Lasix  9/13.     Acute hypoxemic respiratory failure, COPD exacerbation Aspiration PNA, not POA - due to acute cardiogenic pulmonary edema, aspiration PNA and COPD exacerbation. -O2 sats 97 to 100% on 2 L O2 via [place] -Chest x-ray 09/09 persistent right lower lobe infiltrate.  - continue antibiotics, Pulmicort , Brovana , DuoNebs, O2, IV Unasyn  - Palliative medicine consulted for GOC   Urinary retention, Enterococcus UTI -Foley catheter placed - Urine culture showed more than 100,000 colonies of Enterococcus faecalis -  continue IV ampicillin   Essential hypertension, CAD BP stable, continue losartan , metoprolol , Aldactone    Atrial fibrillation, chronic (HCC) -Continue beta-blocker, apixaban    Hypernatremia Sodium improving, 148 on 9/5  - Continue Aldactone     Spinal stenosis, debility -PT evaluation recommended SNF, TOC consult placed     Hyperlipidemia Continue statin    Protein-calorie malnutrition, severe Nutrition Problem: Severe Malnutrition Etiology: chronic illness (CHF, dysphagia) Signs/Symptoms: severe fat depletion, severe muscle depletion Interventions: Ensure Enlive (each supplement provides 350kcal and 20 grams of protein), Magic cup  Underweight, debility Estimated body mass index is 17.97 kg/m as calculated from the following:   Height as of this encounter: 5' 9 (1.753 m).   Weight as of  this encounter: 55.2 kg.  Code Status: DNR DVT Prophylaxis:  apixaban  (ELIQUIS ) tablet 2.5 mg Start: 12/02/23 1000 apixaban  (ELIQUIS ) tablet 2.5 mg   Level of Care: Level of care: Telemetry Cardiac Family Communication: Updated patient's wife at the bedside on 9/13 Disposition Plan:      Remains inpatient appropriate: PT Evaluation recommended SNF TOC assisting.  Procedures:  2D echo  Consultants:   Palliative medicine  Antimicrobials:   Anti-infectives (From admission, onward)    Start     Dose/Rate Route Frequency Ordered Stop   12/10/23 2000   ampicillin  (OMNIPEN) 2 g in sodium chloride  0.9 % 100 mL IVPB        2 g 300 mL/hr over 20 Minutes Intravenous Every 6 hours 12/10/23 1514 12/17/23 1759   12/10/23 1400  ampicillin  (OMNIPEN) 500 mg in sodium chloride  0.9 % 50 mL IVPB  Status:  Discontinued        500 mg 150 mL/hr over 20 Minutes Intravenous Every 8 hours 12/10/23 1226 12/10/23 1514   12/10/23 1300  ampicillin  (OMNIPEN) 500 mg in sodium chloride  0.9 % 50 mL IVPB  Status:  Discontinued        500 mg 150 mL/hr over 20 Minutes Intravenous Every 6 hours 12/10/23 1221 12/10/23 1226   12/09/23 1400  cefTRIAXone  (ROCEPHIN ) 2 g in sodium chloride  0.9 % 100 mL IVPB  Status:  Discontinued        2 g 200 mL/hr over 30 Minutes Intravenous Every 24 hours 12/09/23 0844 12/10/23 1221   12/08/23 2200  ampicillin -sulbactam (UNASYN ) 1.5 g in sodium chloride  0.9 % 100 mL IVPB  Status:  Discontinued        1.5 g 200 mL/hr over 30 Minutes Intravenous Every 8 hours 12/08/23 2102 12/09/23 0954   12/08/23 1300  Urelle  (URELLE /URISED) 81 MG tablet 81 mg        1 tablet Oral 3 times daily 12/08/23 1204 12/11/23 0845   12/05/23 2000  ampicillin -sulbactam (UNASYN ) 1.5 g in sodium chloride  0.9 % 100 mL IVPB  Status:  Discontinued        1.5 g 200 mL/hr over 30 Minutes Intravenous Every 12 hours 12/05/23 1952 12/08/23 2102          Medications  apixaban   2.5 mg Oral BID   arformoterol   15 mcg Nebulization BID   atorvastatin   20 mg Oral Daily   budesonide  (PULMICORT ) nebulizer solution  0.25 mg Nebulization BID   Chlorhexidine  Gluconate Cloth  6 each Topical Q0600   diclofenac  Sodium  2 g Topical QID   feeding supplement  237 mL Oral BID BM   losartan   25 mg Oral Daily   metoprolol  succinate  25 mg Oral Daily   multivitamin with minerals  1 tablet Oral Daily   predniSONE   40 mg Oral Q breakfast   spironolactone   12.5 mg Oral Daily   tamsulosin   0.4 mg Oral QPC supper      Subjective:   [name] was seen and examined today.  No  acute complaints.  Feels somewhat better today.   Objective:   Vitals:   12/11/23 0831 12/11/23 0834 12/11/23 0905 12/11/23 0908  BP: 124/89 124/89    Pulse: 70 73    Resp: 20     Temp: 98 F (36.7 C)     TempSrc: Oral     SpO2: 100%  100% 98%  Weight:      Height:        Intake/Output Summary (Last  24 hours) at 12/11/2023 1219 Last data filed at 12/11/2023 9377 Gross per 24 hour  Intake 660 ml  Output 1350 ml  Net -690 ml     Wt Readings from Last 3 Encounters:  12/11/23 55.2 kg  12/01/23 53.1 kg  11/02/23 53.1 kg   Physical Exam General: Alert and oriented frail, ill-appearing Cardiovascular: S1 S2 clear, RRR.  Respiratory: CTAB, no wheezing Gastrointestinal: Soft, nontender, nondistended, NBS Ext: no pedal edema bilaterally Neuro: no new deficits Psych: Normal affect, generalized debility   Data Reviewed:  I have personally reviewed following labs    CBC Lab Results  Component Value Date   WBC 12.9 (H) 12/11/2023   RBC 4.12 (L) 12/11/2023   HGB 13.0 12/11/2023   HCT 40.4 12/11/2023   MCV 98.1 12/11/2023   MCH 31.6 12/11/2023   PLT 192 12/11/2023   MCHC 32.2 12/11/2023   RDW 13.4 12/11/2023   LYMPHSABS 1.1 04/10/2008   MONOABS 0.5 04/10/2008   EOSABS 0.2 04/10/2008   BASOSABS 0.0 04/10/2008     Last metabolic panel Lab Results  Component Value Date   NA 141 12/10/2023   K 4.7 12/10/2023   CL 104 12/10/2023   CO2 26 12/10/2023   BUN 40 (H) 12/10/2023   CREATININE 0.81 12/10/2023   GLUCOSE 101 (H) 12/10/2023   GFRNONAA >60 12/10/2023   GFRAA >60 06/16/2017   CALCIUM  8.4 (L) 12/10/2023   PROT 7.0 10/24/2023   ALBUMIN 4.5 10/24/2023   BILITOT 1.5 (H) 10/24/2023   ALKPHOS 62 10/24/2023   AST 23 10/24/2023   ALT 19 10/24/2023   ANIONGAP 11 12/10/2023    CBG (last 3)  No results for input(s): GLUCAP in the last 72 hours.    Coagulation Profile: No results for input(s): INR, PROTIME in the last 168 hours.    Radiology Studies:  I have personally reviewed the imaging studies  DG CHEST PORT 1 VIEW Result Date: 12/09/2023 CLINICAL DATA:  Shortness of breath EXAM: PORTABLE CHEST 1 VIEW COMPARISON:  Film from earlier in the same day. FINDINGS: Cardiac shadow is stable. Pacing device is again seen. Aortic calcifications are noted. The lungs are well aerated bilaterally. Increasing airspace opacity in the right base is noted consistent with developing infiltrate. IMPRESSION: Increasing right basilar infiltrate. Electronically Signed   By: Oneil Devonshire M.D.   On: 12/09/2023 21:05   DG CHEST PORT 1 VIEW Result Date: 12/09/2023 CLINICAL DATA:  Dyspnea EXAM: PORTABLE CHEST 1 VIEW COMPARISON:  12/05/2023 FINDINGS: Dual lead pacer noted. Atherosclerotic calcification of the aortic arch. The patient is rotated to the right on today's radiograph, reducing diagnostic sensitivity and specificity. Substantial degenerative glenohumeral arthropathy bilaterally. Upper normal cardiac size. Improved aeration at the right lung base. Equivocal blunting of the right lateral costophrenic angle, cannot exclude trace right pleural effusion. IMPRESSION: 1. Improved aeration at the right lung base. 2. Equivocal blunting of the right lateral costophrenic angle, cannot exclude trace right pleural effusion. 3. Substantial degenerative glenohumeral arthropathy bilaterally. 4. Aortic Atherosclerosis (ICD10-I70.0). Electronically Signed   By: Ryan Salvage M.D.   On: 12/09/2023 12:54        Vrinda Heckstall M.D. [facility] 12/11/2023, 12:19 PM  Available via Epic secure chat 7am-7pm After 7 pm, please refer to night coverage provider listed on amion.

## 2023-12-11 NOTE — Progress Notes (Signed)
 Physical Therapy Treatment Patient Details Name: Guy Espinoza MRN: [id number] DOB: 09-03-1926 Today's Date: 12/11/2023   History of Present Illness Pt is a 89 y.o. male admitted 9/5 for SOB. CT showed bil pleural effusion & pulmonary nodule. Chest x-ray 09/09 showing persistent right lower lobe infiltrate. Increased lethargy 9/11 PT session, palliative care consulted by MD 9/14. PMH: HTN, asthma, pacemaker, afib, CAD, CHF, AV block    PT Comments  Pt received in supine, alert and polite with restless LE, pt agreeable to therapy session. Pt cooperative with supine BLE ROM/exercises for warm-up and pt chair set up in anticipation of lunch arrival. Once pt working on L/R rolling to transfer to EOB, pt observed to be soiled, NT called to room to assist with bed bath/linen change and PTA plan to return to work more on OOB/standing and gait progression after bath. Patient will benefit from continued inpatient follow up therapy, <3 hours/day, pending discussion with palliative care team on plan post-acute.    If plan is discharge home, recommend the following: Help with stairs or ramp for entrance;Assist for transportation;Assistance with cooking/housework;A lot of help with walking and/or transfers;A little help with bathing/dressing/bathroom;Direct supervision/assist for medications management;Supervision due to cognitive status;Assistance with feeding;Direct supervision/assist for financial management   Can travel by private vehicle     Yes  Equipment Recommendations  None recommended by PT (TBD pending progress and plan)    Recommendations for Other Services       Precautions / Restrictions Precautions Precautions: Fall Recall of Precautions/Restrictions: Impaired Precaution/Restrictions Comments: bowel incontinence and now has foley; high aspiration risk- nectar thick liquids Restrictions Weight Bearing Restrictions Per Provider Order: No     Mobility  Bed Mobility Overal bed mobility:  Needs Assistance Bed Mobility: Rolling Rolling: Supervision, Contact guard assist, Used rails         General bed mobility comments: rolling slightly to L/R sides for clean-up, but NT x2 arriving to give more thorough bed bath/gown change at end of session, defer EOB/OOB until pt cleaned up fully as pt agreeable to get OOB to chair after bath.    Transfers                   General transfer comment: pt bed very soiled and needs bath, defer transfer OOB until after clean-up; pt reports no further bowel urgency and does not need BSC at this time.    Ambulation/Gait                   Stairs             Wheelchair Mobility     Tilt Bed    Modified Rankin (Stroke Patients Only)       Balance Overall balance assessment: Needs assistance     Sitting balance - Comments: defer- plan to assess later after bath                                    Communication Communication Communication: Impaired Factors Affecting Communication: Hearing impaired  Cognition Arousal: Alert Behavior During Therapy: Flat affect, Restless   PT - Cognitive impairments: Attention, Initiation, Sequencing, Problem solving, Safety/Judgement, Awareness                       PT - Cognition Comments: Pt more alert today than previous session wtih this PTA, but when pt began mobilizing to EOB,  pt gown noted to be soiled and bed linens/pad under him also soiled, pt had not notified staff; NT/RN notified. Spouse present and supportive. Following commands: Intact      Cueing Cueing Techniques: Verbal cues, Gestural cues, Tactile cues  Exercises Other Exercises Other Exercises: supine BLE AROM: ankle pumps, heel slides, hip ab/adduction x10 reps ea prior to EOB/OOB Other Exercises: IS x 3 reps pt achieves 150-450 mL, pt encouraged to perform 5-10x hourly as able.    General Comments General comments (skin integrity, edema, etc.): HR 73 bpm, SpO2 99% on 2L O2  [place] and RR ~24 rpm, BP 113/77 taken supine; had planned to check seated BP but once rolling, pt observed to be soiled by BM, NT x2 arriving to assist with bed bath. SpO2 weaned to RA in anticipation of transfer and reading 95% on RA but RR increased to 30 rpm on RA, so pt returned to 2L [place] due to increased WOB on RA.      Pertinent Vitals/Pain Pain Assessment Pain Assessment: No/denies pain Pain Intervention(s): Monitored during session, Repositioned    Home Living                          Prior Function            PT Goals (current goals can now be found in the care plan section) Acute Rehab PT Goals Patient Stated Goal: to improve mobility PT Goal Formulation: With patient Time For Goal Achievement: 12/17/23 Progress towards PT goals: Progressing toward goals    Frequency    Min 2X/week      PT Plan      Co-evaluation              AM-PAC PT 6 Clicks Mobility   Outcome Measure  Help needed turning from your back to your side while in a flat bed without using bedrails?: A Little Help needed moving from lying on your back to sitting on the side of a flat bed without using bedrails?: A Little Help needed moving to and from a bed to a chair (including a wheelchair)?: A Little Help needed standing up from a chair using your arms (e.g., wheelchair or bedside chair)?: A Little Help needed to walk in hospital room?: Total (<72ft) Help needed climbing 3-5 steps with a railing? : Total 6 Click Score: 14    End of Session Equipment Utilized During Treatment: Oxygen Activity Tolerance: Treatment limited secondary to medical complications (Comment);Other (comment);Patient limited by fatigue (bowel incontinence and need for clean-up) Patient left: in bed;with call bell/phone within reach;with bed alarm set;with family/visitor present;Other (comment);with nursing/sitter in room (NT x2 arriving to give him a bath) Nurse Communication: Mobility  status;Precautions;Other (comment) (needs a bath; elevated RR when on RA, decreases when he's on 2L [place]) PT Visit Diagnosis: Unsteadiness on feet (R26.81);Other abnormalities of gait and mobility (R26.89);Muscle weakness (generalized) (M62.81);Difficulty in walking, not elsewhere classified (R26.2)     Time: 8771-8758 PT Time Calculation (min) (ACUTE ONLY): 13 min  Charges:    $Therapeutic Exercise: 8-22 mins PT General Charges $$ ACUTE PT VISIT: 1 Visit                     Arisbeth Purrington P., PTA Acute Rehabilitation Services Secure Chat Preferred 9a-5:30pm Office: [phone]    Connell HERO [facility] 12/11/2023, 1:54 PM

## 2023-12-11 NOTE — Progress Notes (Signed)
 PT Cancellation Note  Patient Details Name: OSIAS RESNICK MRN: [id number] DOB: Oct 20, 1926   Cancelled Treatment:    Reason Eval/Treat Not Completed: (P) Other (comment) (pt and spouse meeting with member of care team. Plan to return a little later for PT session as per chart review, pt needs PT note for insurance auth.) Will continue efforts per plan of care this date as schedule permits.   Shunte Senseney M Jyssica Rief 12/11/2023, 10:48 AM

## 2023-12-11 NOTE — Progress Notes (Signed)
 Nutrition Follow-up  DOCUMENTATION CODES:   Severe malnutrition in context of chronic illness, Underweight (CHF, dysphagia)  INTERVENTION:  Ensure Plus High Protein po BID, each supplement provides 350 kcal and 20 grams of protein.  Magic cup TID with meals, each supplement provides 290 kcal and 9 grams of protein  Encouraged adequate intake of meals and supplements to meet calorie and protein needs    NUTRITION DIAGNOSIS:   Severe Malnutrition related to chronic illness (CHF, dysphagia) as evidenced by severe fat depletion, severe muscle depletion. New diagnosis   GOAL:   Patient will meet greater than or equal to 90% of their needs Progressing  MONITOR:   PO intake, Supplement acceptance  REASON FOR ASSESSMENT:   Consult Assessment of nutrition requirement/status  ASSESSMENT:   Pt with medical history significant for atrial fibrillation on Eliquis , hypertension, pacemaker, CAD, pulmonary hypertension, and severe coronary artery disease with multiple blockages per cath 07/2023.  He is currently under medical management.  9/5 admitted 9/8 MBS, SLP recommends DYS 2  9/15 DYS 2, fair appetite   Spoke to spouse at bedside, patient sleepy this afternoon. Patient tolerating dysphagia diet with fair intake, 50% breakfast eaten this morning. Spouse reports patient likes the magic cups and ensure, would like to continue. Palliative following for GOC.   Average Meal Completion 9/8: 32% average intake x  recorded meals  Labs: Glu 101 BUN 41   Meds:  MVI w/ minerals daily  Prednisone  daily   NUTRITION - FOCUSED PHYSICAL EXAM:  Flowsheet Row Most Recent Value  Orbital Region Severe depletion  Upper Arm Region Severe depletion  Thoracic and Lumbar Region Severe depletion  Buccal Region Severe depletion  Temple Region Severe depletion  Clavicle Bone Region Severe depletion  Clavicle and Acromion Bone Region Severe depletion  Scapular Bone Region Severe depletion   Dorsal Hand Severe depletion  Patellar Region Severe depletion  Anterior Thigh Region Severe depletion  Posterior Calf Region Severe depletion  Edema (RD Assessment) None  Hair Reviewed  Eyes Reviewed  Mouth Reviewed  Skin Reviewed  Nails Reviewed    Diet Order:   Diet Order             DIET DYS 2 Room service appropriate? Yes; Fluid consistency: Nectar Thick  Diet effective now                   EDUCATION NEEDS:   No education needs have been identified at this time  Skin:  Skin Assessment: Reviewed RN Assessment  Last BM:  9/15  Height:   Ht Readings from Last 1 Encounters:  12/01/23 5' 9 (1.753 m)    Weight:   Wt Readings from Last 1 Encounters:  12/11/23 55.2 kg    Ideal Body Weight:  72.7 kg  BMI:  Body mass index is 17.97 kg/m.  Estimated Nutritional Needs:   Kcal:  1600-1800  Protein:  80-95 grams  Fluid:  1.2 L  Dason Mosley, MS, RD, LDN Clinical Dietitian  Please see AMiON for contact information.

## 2023-12-11 NOTE — Progress Notes (Signed)
 Physical Therapy Treatment Patient Details Name: Guy Espinoza MRN: [id number] DOB: 1926-07-08 Today's Date: 12/11/2023   History of Present Illness Pt is a 89 y.o. male admitted 9/5 for SOB. CT showed bil pleural effusion & pulmonary nodule. Chest x-ray 09/09 showing persistent right lower lobe infiltrate. Increased lethargy 9/11 PT session, palliative care consulted by MD 9/14. PMH: HTN, asthma, pacemaker, afib, CAD, CHF, AV block    PT Comments  Pt received in supine after receiving bed bath, pt agreeable to work more on transfer and gait training in second session as mobility in initial session had been limited. Pt needing up to minA for stability during gait trial using RW and very unsteady without external assist. Pt quick to fatigue and needing 2L O2 [place] to keep RR below 30 rpm at rest in chair. Spouse present and preparing to assist him to eat, RN notified as pt requesting more water and pt SLP notes indicate he needs nectar thick liquids. Patient will benefit from continued inpatient follow up therapy, <3 hours/day.    If plan is discharge home, recommend the following: Help with stairs or ramp for entrance;Assist for transportation;Assistance with cooking/housework;A lot of help with walking and/or transfers;A little help with bathing/dressing/bathroom;Direct supervision/assist for medications management;Supervision due to cognitive status;Assistance with feeding;Direct supervision/assist for financial management   Can travel by private vehicle     Yes  Equipment Recommendations  None recommended by PT (TBD pending progress and plan)    Recommendations for Other Services       Precautions / Restrictions Precautions Precautions: Fall Recall of Precautions/Restrictions: Impaired Precaution/Restrictions Comments: bowel incontinence and now has foley; high aspiration risk- nectar thick liquids Restrictions Weight Bearing Restrictions Per Provider Order: No     Mobility  Bed  Mobility Overal bed mobility: Needs Assistance Bed Mobility: Rolling, Supine to Sit Rolling: Used rails, Supervision   Supine to sit: Contact guard, Used rails, HOB elevated     General bed mobility comments: Increased time/effort to sit up to R EOB, CGA and assist with lines/bed pad, cues for improved body mechanics.    Transfers Overall transfer level: Needs assistance Equipment used: Rolling walker (2 wheels) Transfers: Sit to/from Stand Sit to Stand: Contact guard assist, From elevated surface           General transfer comment: bed>RW, vcs to push from bed, pt placing BUE on RW handles despite cues; tactile and verbal cues to push from bed, pt achieves upright wtih CGA on second attempt. Cues for safer UE placement with stand>sit in chair.    Ambulation/Gait Ambulation/Gait assistance: Min assist Gait Distance (Feet): 6 Feet Assistive device: Rolling walker (2 wheels) Gait Pattern/deviations: Decreased stride length, Trunk flexed, Knee flexed in stance - left, Step-through pattern, Shuffle, Wide base of support Gait velocity: decreased     General Gait Details: SpO2 95% on RA while ambulating short distance at bedside, RR elevated to >30 rpm. Consistent minA needed for steadying along with RW support. Distance limited due to pt fatigue and requesting to sit up in chair to eat his lunch which had just arrived, spouse present and agreeable to assist him. 2L O2 [place] reapplied in chair as pt RR elevated despite SpO2 94-95% on RA.   Stairs             Wheelchair Mobility     Tilt Bed    Modified Rankin (Stroke Patients Only)       Balance Overall balance assessment: Needs assistance Sitting-balance support: No upper  extremity supported, Feet supported Sitting balance-Leahy Scale: Fair Sitting balance - Comments: defer- plan to assess later after bath   Standing balance support: Bilateral upper extremity supported, During functional activity, Reliant on  assistive device for balance Standing balance-Leahy Scale: Poor Standing balance comment: Reliant on BUE and external support                            Communication Communication Communication: Impaired Factors Affecting Communication: Hearing impaired  Cognition Arousal: Alert Behavior During Therapy: Flat affect, Restless   PT - Cognitive impairments: Attention, Initiation, Sequencing, Problem solving, Safety/Judgement, Awareness                       PT - Cognition Comments: Following 1-step commands well. Following commands: Intact      Cueing Cueing Techniques: Verbal cues, Gestural cues, Tactile cues  Exercises Other Exercises Other Exercises: supine BLE AROM: ankle pumps, heel slides, hip ab/adduction x10 reps ea prior to EOB/OOB Other Exercises: IS x 3 reps pt achieves 150-450 mL, pt encouraged to perform 5-10x hourly as able.    General Comments General comments (skin integrity, edema, etc.): see gait comments; no dizziness reported but pt quick to fatigue in standing.      Pertinent Vitals/Pain Pain Assessment Pain Assessment: No/denies pain Pain Intervention(s): Limited activity within patient's tolerance, Monitored during session, Repositioned    Home Living                          Prior Function            PT Goals (current goals can now be found in the care plan section) Acute Rehab PT Goals Patient Stated Goal: to improve mobility PT Goal Formulation: With patient Time For Goal Achievement: 12/17/23 Progress towards PT goals: Progressing toward goals    Frequency    Min 2X/week      PT Plan      Co-evaluation              AM-PAC PT 6 Clicks Mobility   Outcome Measure  Help needed turning from your back to your side while in a flat bed without using bedrails?: A Little Help needed moving from lying on your back to sitting on the side of a flat bed without using bedrails?: A Little Help needed  moving to and from a bed to a chair (including a wheelchair)?: A Little Help needed standing up from a chair using your arms (e.g., wheelchair or bedside chair)?: A Little Help needed to walk in hospital room?: Total (<11ft) Help needed climbing 3-5 steps with a railing? : Total 6 Click Score: 14    End of Session Equipment Utilized During Treatment: Oxygen Activity Tolerance: Treatment limited secondary to medical complications (Comment);Other (comment);Patient limited by fatigue (bowel incontinence and need for clean-up) Patient left: in bed;with call bell/phone within reach;with bed alarm set;with family/visitor present;Other (comment);with nursing/sitter in room (NT x2 arriving to give him a bath) Nurse Communication: Mobility status;Precautions;Other (comment) (needs a bath; elevated RR when on RA, decreases when he's on 2L [place]) PT Visit Diagnosis: Unsteadiness on feet (R26.81);Other abnormalities of gait and mobility (R26.89);Muscle weakness (generalized) (M62.81);Difficulty in walking, not elsewhere classified (R26.2)     Time: 8691-8678 PT Time Calculation (min) (ACUTE ONLY): 13 min  Charges:    $Therapeutic Activity: 8-22 mins PT General Charges $$ ACUTE PT VISIT: 1 Visit  Connell SQUIBB., PTA Acute Rehabilitation Services Secure Chat Preferred 9a-5:30pm Office: [phone]    Connell HERO [facility] 12/11/2023, 2:09 PM

## 2023-12-12 ENCOUNTER — Inpatient Hospital Stay (HOSPITAL_COMMUNITY)
Admission: EM | Admit: 2023-12-12 | Discharge: 2023-12-18 | Disposition: A | Discharge status: Hospice | Source: Skilled Nursing Facility | Attending: Internal Medicine | Admitting: Internal Medicine

## 2023-12-12 ENCOUNTER — Other Ambulatory Visit: Payer: Self-pay

## 2023-12-12 ENCOUNTER — Emergency Department (HOSPITAL_COMMUNITY)

## 2023-12-12 ENCOUNTER — Inpatient Hospital Stay (HOSPITAL_COMMUNITY)

## 2023-12-12 ENCOUNTER — Encounter (HOSPITAL_COMMUNITY): Payer: Self-pay | Admitting: Internal Medicine

## 2023-12-12 DIAGNOSIS — Z66 Do not resuscitate: Secondary | ICD-10-CM | POA: Diagnosis present

## 2023-12-12 DIAGNOSIS — J441 Chronic obstructive pulmonary disease with (acute) exacerbation: Secondary | ICD-10-CM | POA: Diagnosis present

## 2023-12-12 DIAGNOSIS — J9601 Acute respiratory failure with hypoxia: Secondary | ICD-10-CM | POA: Diagnosis present

## 2023-12-12 DIAGNOSIS — D638 Anemia in other chronic diseases classified elsewhere: Secondary | ICD-10-CM | POA: Diagnosis present

## 2023-12-12 DIAGNOSIS — M48061 Spinal stenosis, lumbar region without neurogenic claudication: Secondary | ICD-10-CM

## 2023-12-12 DIAGNOSIS — M4800 Spinal stenosis, site unspecified: Secondary | ICD-10-CM | POA: Diagnosis present

## 2023-12-12 DIAGNOSIS — Z792 Long term (current) use of antibiotics: Secondary | ICD-10-CM

## 2023-12-12 DIAGNOSIS — B952 Enterococcus as the cause of diseases classified elsewhere: Secondary | ICD-10-CM | POA: Diagnosis present

## 2023-12-12 DIAGNOSIS — E43 Unspecified severe protein-calorie malnutrition: Secondary | ICD-10-CM | POA: Diagnosis present

## 2023-12-12 DIAGNOSIS — I7389 Other specified peripheral vascular diseases: Secondary | ICD-10-CM | POA: Diagnosis present

## 2023-12-12 DIAGNOSIS — I5023 Acute on chronic systolic (congestive) heart failure: Secondary | ICD-10-CM | POA: Diagnosis present

## 2023-12-12 DIAGNOSIS — N390 Urinary tract infection, site not specified: Secondary | ICD-10-CM | POA: Diagnosis present

## 2023-12-12 DIAGNOSIS — R1310 Dysphagia, unspecified: Secondary | ICD-10-CM | POA: Diagnosis present

## 2023-12-12 DIAGNOSIS — R54 Age-related physical debility: Secondary | ICD-10-CM | POA: Diagnosis present

## 2023-12-12 DIAGNOSIS — I5043 Acute on chronic combined systolic (congestive) and diastolic (congestive) heart failure: Secondary | ICD-10-CM | POA: Diagnosis present

## 2023-12-12 DIAGNOSIS — R339 Retention of urine, unspecified: Secondary | ICD-10-CM | POA: Diagnosis present

## 2023-12-12 DIAGNOSIS — I110 Hypertensive heart disease with heart failure: Secondary | ICD-10-CM | POA: Diagnosis present

## 2023-12-12 DIAGNOSIS — Z79899 Other long term (current) drug therapy: Secondary | ICD-10-CM

## 2023-12-12 DIAGNOSIS — Z515 Encounter for palliative care: Secondary | ICD-10-CM

## 2023-12-12 DIAGNOSIS — Z7984 Long term (current) use of oral hypoglycemic drugs: Secondary | ICD-10-CM

## 2023-12-12 DIAGNOSIS — I509 Heart failure, unspecified: Secondary | ICD-10-CM | POA: Diagnosis not present

## 2023-12-12 DIAGNOSIS — J9621 Acute and chronic respiratory failure with hypoxia: Secondary | ICD-10-CM | POA: Diagnosis present

## 2023-12-12 DIAGNOSIS — R7989 Other specified abnormal findings of blood chemistry: Secondary | ICD-10-CM | POA: Diagnosis present

## 2023-12-12 DIAGNOSIS — I10 Essential (primary) hypertension: Secondary | ICD-10-CM | POA: Diagnosis present

## 2023-12-12 DIAGNOSIS — Z950 Presence of cardiac pacemaker: Secondary | ICD-10-CM

## 2023-12-12 DIAGNOSIS — I4430 Unspecified atrioventricular block: Secondary | ICD-10-CM | POA: Diagnosis present

## 2023-12-12 DIAGNOSIS — I447 Left bundle-branch block, unspecified: Secondary | ICD-10-CM | POA: Diagnosis present

## 2023-12-12 DIAGNOSIS — I2510 Atherosclerotic heart disease of native coronary artery without angina pectoris: Secondary | ICD-10-CM | POA: Diagnosis present

## 2023-12-12 DIAGNOSIS — Z7951 Long term (current) use of inhaled steroids: Secondary | ICD-10-CM

## 2023-12-12 DIAGNOSIS — I272 Pulmonary hypertension, unspecified: Secondary | ICD-10-CM | POA: Diagnosis present

## 2023-12-12 DIAGNOSIS — Z681 Body mass index (BMI) 19 or less, adult: Secondary | ICD-10-CM

## 2023-12-12 DIAGNOSIS — Z79891 Long term (current) use of opiate analgesic: Secondary | ICD-10-CM

## 2023-12-12 DIAGNOSIS — Z825 Family history of asthma and other chronic lower respiratory diseases: Secondary | ICD-10-CM

## 2023-12-12 DIAGNOSIS — I482 Chronic atrial fibrillation, unspecified: Secondary | ICD-10-CM | POA: Diagnosis present

## 2023-12-12 DIAGNOSIS — N400 Enlarged prostate without lower urinary tract symptoms: Secondary | ICD-10-CM | POA: Diagnosis present

## 2023-12-12 DIAGNOSIS — R0600 Dyspnea, unspecified: Secondary | ICD-10-CM | POA: Diagnosis not present

## 2023-12-12 DIAGNOSIS — E7800 Pure hypercholesterolemia, unspecified: Secondary | ICD-10-CM | POA: Diagnosis present

## 2023-12-12 DIAGNOSIS — Z7901 Long term (current) use of anticoagulants: Secondary | ICD-10-CM

## 2023-12-12 DIAGNOSIS — Z23 Encounter for immunization: Secondary | ICD-10-CM | POA: Diagnosis present

## 2023-12-12 DIAGNOSIS — R1319 Other dysphagia: Secondary | ICD-10-CM

## 2023-12-12 LAB — CBC WITH DIFFERENTIAL/PLATELET
Abs Immature Granulocytes: 0.11 K/uL — ABNORMAL HIGH (ref 0.00–0.07)
Basophils Absolute: 0 K/uL (ref 0.0–0.1)
Basophils Relative: 0 %
Eosinophils Absolute: 0 K/uL (ref 0.0–0.5)
Eosinophils Relative: 0 %
HCT: 38.4 % — ABNORMAL LOW (ref 39.0–52.0)
Hemoglobin: 12.5 g/dL — ABNORMAL LOW (ref 13.0–17.0)
Immature Granulocytes: 1 %
Lymphocytes Relative: 2 %
Lymphs Abs: 0.3 K/uL — ABNORMAL LOW (ref 0.7–4.0)
MCH: 31.6 pg (ref 26.0–34.0)
MCHC: 32.6 g/dL (ref 30.0–36.0)
MCV: 97.2 fL (ref 80.0–100.0)
Monocytes Absolute: 0.6 K/uL (ref 0.1–1.0)
Monocytes Relative: 4 %
Neutro Abs: 13 K/uL — ABNORMAL HIGH (ref 1.7–7.7)
Neutrophils Relative %: 93 %
Platelets: 187 K/uL (ref 150–400)
RBC: 3.95 MIL/uL — ABNORMAL LOW (ref 4.22–5.81)
RDW: 13.4 % (ref 11.5–15.5)
WBC: 14.1 K/uL — ABNORMAL HIGH (ref 4.0–10.5)
nRBC: 0 % (ref 0.0–0.2)

## 2023-12-12 LAB — BASIC METABOLIC PANEL WITH GFR
Anion gap: 8 (ref 5–15)
Anion gap: 11 (ref 5–15)
BUN: 34 mg/dL — ABNORMAL HIGH (ref 8–23)
BUN: 36 mg/dL — ABNORMAL HIGH (ref 8–23)
CO2: 28 mmol/L (ref 22–32)
CO2: 25 mmol/L (ref 22–32)
Calcium: 8.4 mg/dL — ABNORMAL LOW (ref 8.9–10.3)
Calcium: 8.2 mg/dL — ABNORMAL LOW (ref 8.9–10.3)
Chloride: 103 mmol/L (ref 98–111)
Chloride: 100 mmol/L (ref 98–111)
Creatinine, Ser: 0.99 mg/dL (ref 0.61–1.24)
Creatinine, Ser: 0.9 mg/dL (ref 0.61–1.24)
GFR, Estimated: >60 mL/min (ref >60)
GFR, Estimated: >60 mL/min (ref >60)
Glucose, Bld: 134 mg/dL — ABNORMAL HIGH (ref 70–99)
Glucose, Bld: 179 mg/dL — ABNORMAL HIGH (ref 70–99)
Potassium: 4.6 mmol/L (ref 3.5–5.1)
Potassium: 4.3 mmol/L (ref 3.5–5.1)
Sodium: 139 mmol/L (ref 135–145)
Sodium: 136 mmol/L (ref 135–145)

## 2023-12-12 LAB — BLOOD GAS, VENOUS
Acid-Base Excess: 6.6 mmol/L — ABNORMAL HIGH (ref 0.0–2.0)
Bicarbonate: 31.3 mmol/L — ABNORMAL HIGH (ref 20.0–28.0)
Drawn by: 8669
O2 Saturation: 97 %
Patient temperature: 37
pCO2, Ven: 44 mmHg (ref 44–60)
pH, Ven: 7.46 — ABNORMAL HIGH (ref 7.25–7.43)
pO2, Ven: 69 mmHg — ABNORMAL HIGH (ref 32–45)

## 2023-12-12 LAB — COMPREHENSIVE METABOLIC PANEL WITH GFR
ALT: 95 U/L — ABNORMAL HIGH (ref 0–44)
AST: 55 U/L — ABNORMAL HIGH (ref 15–41)
Albumin: 2.7 g/dL — ABNORMAL LOW (ref 3.5–5.0)
Alkaline Phosphatase: 69 U/L (ref 38–126)
Anion gap: 11 (ref 5–15)
BUN: 36 mg/dL — ABNORMAL HIGH (ref 8–23)
CO2: 21 mmol/L — ABNORMAL LOW (ref 22–32)
Calcium: 8.2 mg/dL — ABNORMAL LOW (ref 8.9–10.3)
Chloride: 102 mmol/L (ref 98–111)
Creatinine, Ser: 0.83 mg/dL (ref 0.61–1.24)
GFR, Estimated: >60 mL/min (ref >60)
Glucose, Bld: 141 mg/dL — ABNORMAL HIGH (ref 70–99)
Potassium: 5 mmol/L (ref 3.5–5.1)
Sodium: 134 mmol/L — ABNORMAL LOW (ref 135–145)
Total Bilirubin: 1 mg/dL (ref 0.0–1.2)
Total Protein: 5.2 g/dL — ABNORMAL LOW (ref 6.5–8.1)

## 2023-12-12 LAB — TROPONIN I (HIGH SENSITIVITY)
Troponin I (High Sensitivity): 14 ng/L (ref <18)
Troponin I (High Sensitivity): 15 ng/L (ref <18)

## 2023-12-12 LAB — CK: Total CK: 31 U/L — ABNORMAL LOW (ref 49–397)

## 2023-12-12 LAB — BRAIN NATRIURETIC PEPTIDE: B Natriuretic Peptide: 418.4 pg/mL — ABNORMAL HIGH (ref 0.0–100.0)

## 2023-12-12 LAB — MAGNESIUM: Magnesium: 2.2 mg/dL (ref 1.7–2.4)

## 2023-12-12 LAB — PROCALCITONIN: Procalcitonin: <0.1 ng/mL

## 2023-12-12 LAB — LACTIC ACID, PLASMA: Lactic Acid, Venous: 2.2 mmol/L (ref 0.5–1.9)

## 2023-12-12 LAB — PHOSPHORUS: Phosphorus: 4.1 mg/dL (ref 2.5–4.6)

## 2023-12-12 MED ORDER — METOPROLOL SUCCINATE ER 25 MG PO TB24
12.5000 mg | ORAL_TABLET | Freq: Every day | ORAL | Status: DC
Start: 2023-12-13 — End: 2023-12-18
  Administered 2023-12-13 – 2023-12-18 (×6): 12.5 mg via ORAL
  Filled 2023-12-12 (×6): qty 1

## 2023-12-12 MED ORDER — SODIUM CHLORIDE 0.9% FLUSH
3.0000 mL | Freq: Two times a day (BID) | INTRAVENOUS | Status: DC
  Administered 2023-12-12 – 2023-12-13 (×2): 3 mL via INTRAVENOUS

## 2023-12-12 MED ORDER — POLYETHYLENE GLYCOL 3350 17 G PO PACK: 17.0000 g | PACK | Freq: Every day | ORAL | Status: DC | PRN

## 2023-12-12 MED ORDER — ONDANSETRON HCL 4 MG PO TABS: 4.0000 mg | ORAL_TABLET | Freq: Four times a day (QID) | ORAL | Status: DC | PRN

## 2023-12-12 MED ORDER — SENNA 8.6 MG PO TABS
1.0000 | ORAL_TABLET | Freq: Two times a day (BID) | ORAL | Status: DC
  Administered 2023-12-13 – 2023-12-18 (×10): 8.6 mg via ORAL
  Filled 2023-12-12 (×11): qty 1

## 2023-12-12 MED ORDER — IPRATROPIUM-ALBUTEROL 0.5-2.5 (3) MG/3ML IN SOLN
3.0000 mL | Freq: Once | RESPIRATORY_TRACT | Status: AC
  Administered 2023-12-12: 3 mL via RESPIRATORY_TRACT
  Filled 2023-12-12: qty 3

## 2023-12-12 MED ORDER — TRAMADOL HCL 50 MG PO TABS: 50.0000 mg | ORAL_TABLET | Freq: Two times a day (BID) | ORAL | 0 refills | Status: DC | PRN

## 2023-12-12 MED ORDER — TRAMADOL HCL 50 MG PO TABS: 50.0000 mg | ORAL_TABLET | Freq: Two times a day (BID) | ORAL | Status: DC | PRN

## 2023-12-12 MED ORDER — ONDANSETRON HCL 4 MG/2ML IJ SOLN
4.0000 mg | Freq: Four times a day (QID) | INTRAMUSCULAR | Status: DC | PRN
  Administered 2023-12-17: 4 mg via INTRAVENOUS
  Filled 2023-12-12: qty 2

## 2023-12-12 MED ORDER — SPIRONOLACTONE 25 MG PO TABS: 12.5000 mg | ORAL_TABLET | Freq: Every day | ORAL | Status: DC

## 2023-12-12 MED ORDER — HYDROCODONE-ACETAMINOPHEN 5-325 MG PO TABS: 1.0000 | ORAL_TABLET | ORAL | Status: DC | PRN

## 2023-12-12 MED ORDER — ALBUTEROL SULFATE (2.5 MG/3ML) 0.083% IN NEBU
2.5000 mg | INHALATION_SOLUTION | RESPIRATORY_TRACT | Status: DC | PRN
  Administered 2023-12-14 – 2023-12-16 (×3): 2.5 mg via RESPIRATORY_TRACT
  Filled 2023-12-12 (×3): qty 3

## 2023-12-12 MED ORDER — FUROSEMIDE 20 MG PO TABS: 20.0000 mg | ORAL_TABLET | Freq: Every day | ORAL | Status: DC | PRN

## 2023-12-12 MED ORDER — MELATONIN 3 MG PO TABS
3.0000 mg | ORAL_TABLET | Freq: Every day | ORAL | Status: DC
  Administered 2023-12-13 – 2023-12-17 (×5): 3 mg via ORAL
  Filled 2023-12-12 (×5): qty 1

## 2023-12-12 MED ORDER — AMPICILLIN 500 MG PO CAPS
500.0000 mg | ORAL_CAPSULE | Freq: Four times a day (QID) | ORAL | Status: DC
  Administered 2023-12-12: 500 mg via ORAL
  Filled 2023-12-12 (×2): qty 1

## 2023-12-12 MED ORDER — ACETAMINOPHEN 650 MG RE SUPP: 650.0000 mg | Freq: Four times a day (QID) | RECTAL | Status: DC | PRN

## 2023-12-12 MED ORDER — MORPHINE SULFATE (PF) 2 MG/ML IV SOLN
2.0000 mg | INTRAVENOUS | Status: DC | PRN
  Administered 2023-12-13: 2 mg via INTRAVENOUS
  Filled 2023-12-12: qty 1

## 2023-12-12 MED ORDER — AMPICILLIN 500 MG PO CAPS: 500.0000 mg | ORAL_CAPSULE | Freq: Four times a day (QID) | ORAL | Status: DC

## 2023-12-12 MED ORDER — FUROSEMIDE 10 MG/ML IJ SOLN
40.0000 mg | Freq: Once | INTRAMUSCULAR | Status: AC
  Administered 2023-12-12: 40 mg via INTRAVENOUS
  Filled 2023-12-12: qty 4

## 2023-12-12 MED ORDER — APIXABAN 2.5 MG PO TABS
2.5000 mg | ORAL_TABLET | Freq: Two times a day (BID) | ORAL | Status: DC
  Administered 2023-12-13 – 2023-12-18 (×11): 2.5 mg via ORAL
  Filled 2023-12-12 (×11): qty 1

## 2023-12-12 MED ORDER — GUAIFENESIN ER 600 MG PO TB12
600.0000 mg | ORAL_TABLET | Freq: Two times a day (BID) | ORAL | Status: DC
  Administered 2023-12-13 – 2023-12-18 (×11): 600 mg via ORAL
  Filled 2023-12-12 (×11): qty 1

## 2023-12-12 MED ORDER — DOCUSATE SODIUM 100 MG PO CAPS
100.0000 mg | ORAL_CAPSULE | Freq: Two times a day (BID) | ORAL | Status: DC
Start: 2023-12-12 — End: 2023-12-18
  Administered 2023-12-13 – 2023-12-18 (×10): 100 mg via ORAL
  Filled 2023-12-12 (×11): qty 1

## 2023-12-12 MED ORDER — SODIUM CHLORIDE 0.9% FLUSH: 3.0000 mL | INTRAVENOUS | Status: DC | PRN

## 2023-12-12 MED ORDER — SODIUM CHLORIDE 0.9 % IV SOLN: 250.0000 mL | INTRAVENOUS | Status: DC | PRN

## 2023-12-12 MED ORDER — ATORVASTATIN CALCIUM 10 MG PO TABS
20.0000 mg | ORAL_TABLET | Freq: Every day | ORAL | Status: DC
Start: 2023-12-13 — End: 2023-12-18
  Administered 2023-12-13 – 2023-12-18 (×6): 20 mg via ORAL
  Filled 2023-12-12 (×6): qty 2

## 2023-12-12 MED ORDER — DICLOFENAC SODIUM 1 % EX GEL: 2.0000 g | Freq: Four times a day (QID) | CUTANEOUS | Status: DC

## 2023-12-12 MED ORDER — METHYLPREDNISOLONE SODIUM SUCC 125 MG IJ SOLR
125.0000 mg | Freq: Once | INTRAMUSCULAR | Status: AC
  Administered 2023-12-12: 125 mg via INTRAVENOUS
  Filled 2023-12-12: qty 2

## 2023-12-12 MED ORDER — PREDNISONE 20 MG PO TABS
40.0000 mg | ORAL_TABLET | Freq: Every day | ORAL | Status: DC
  Administered 2023-12-13 – 2023-12-16 (×4): 40 mg via ORAL
  Filled 2023-12-12 (×4): qty 2

## 2023-12-12 MED ORDER — BUDESON-GLYCOPYRROL-FORMOTEROL 160-9-4.8 MCG/ACT IN AERO
2.0000 | INHALATION_SPRAY | Freq: Two times a day (BID) | RESPIRATORY_TRACT | Status: DC
  Administered 2023-12-14 – 2023-12-18 (×7): 2 via RESPIRATORY_TRACT
  Filled 2023-12-12 (×2): qty 5.9

## 2023-12-12 MED ORDER — TAMSULOSIN HCL 0.4 MG PO CAPS
0.4000 mg | ORAL_CAPSULE | Freq: Every day | ORAL | Status: DC
  Administered 2023-12-13 – 2023-12-18 (×6): 0.4 mg via ORAL
  Filled 2023-12-12 (×6): qty 1

## 2023-12-12 MED ORDER — IPRATROPIUM-ALBUTEROL 0.5-2.5 (3) MG/3ML IN SOLN: 3.0000 mL | RESPIRATORY_TRACT | Status: DC | PRN

## 2023-12-12 MED ORDER — AMPICILLIN 500 MG PO CAPS
500.0000 mg | ORAL_CAPSULE | Freq: Four times a day (QID) | ORAL | Status: DC
Start: 2023-12-13 — End: 2023-12-13
  Administered 2023-12-13 (×2): 500 mg via ORAL
  Filled 2023-12-12 (×7): qty 1

## 2023-12-12 MED ORDER — ACETAMINOPHEN 325 MG PO TABS: 650.0000 mg | ORAL_TABLET | Freq: Four times a day (QID) | ORAL | Status: DC | PRN

## 2023-12-12 MED ORDER — LOPERAMIDE HCL 2 MG PO CAPS
4.0000 mg | ORAL_CAPSULE | Freq: Once | ORAL | Status: AC
  Administered 2023-12-12: 4 mg via ORAL
  Filled 2023-12-12: qty 2

## 2023-12-12 MED ORDER — PREDNISONE 10 MG PO TABS: ORAL_TABLET | ORAL | Status: DC

## 2023-12-12 MED ORDER — IPRATROPIUM-ALBUTEROL 0.5-2.5 (3) MG/3ML IN SOLN
3.0000 mL | Freq: Four times a day (QID) | RESPIRATORY_TRACT | Status: DC
  Administered 2023-12-13 (×2): 3 mL via RESPIRATORY_TRACT
  Filled 2023-12-12 (×2): qty 3

## 2023-12-12 NOTE — Assessment & Plan Note (Signed)
 Chronic stable continue Eliquis  2.5 mg p.o. twice daily Start metoprolol  blood pressure improved

## 2023-12-12 NOTE — Progress Notes (Signed)
 Report call to Lucie Fellows, LPN @ [place]. No questions at this time. Patient awaiting [facility] transport.

## 2023-12-12 NOTE — Assessment & Plan Note (Signed)
 Actually somewhat soft blood pressures hold blood pressure meds for now

## 2023-12-12 NOTE — Assessment & Plan Note (Signed)
 Status post pacemaker

## 2023-12-12 NOTE — H&P (Signed)
 Guy Espinoza FMW:[id number] DOB: 20-Aug-1926 DOA: 12/12/2023     PCP: Guy Senior, MD   Outpatient Specialists:   CARDS Dr. Soyla DELENA Merck, MD   Patient arrived to ER on 12/12/23 at 1659 Referred by Attending Guy Elsie CROME, MD   Patient coming from:    From facility   SNF,      Chief Complaint:   Chief Complaint  Patient presents with   Shortness of Breath    HPI: Guy Espinoza is a 89 y.o. male with medical history significant of A-fib, HTN, CAD pulmonary hypertension, hyponatremia, spinal stenosis COPD, hypercholesteremia, protein calorie malnutrition    Presented with worsening hypoxia and increased work of breathing Patient was just discharged from the hospital today for CHF was negative 12 L  He is on 2L at baseline  Went to SNF he was hypoxic and wheezy  Got duo-nebs now back to 2 L  His CXR did show some pulmonary edema Patient initially arrived with appear to be in respiratory distress breathing throat 40 times a minute initially requiring nonrebreather and started to feel somewhat better Patient was just discharged today to nursing home facility but seem to have decompensated shortly thereafter. His prior admission felt to be secondary to cardiogenic pulmonary edema aspiration pneumonia and COPD exacerbation patient was discharged on ampicillin  500 every 6 hours for the next 5 days prednisone  taper  He had recent echogram done that showed EF of 35-40% with global hypokinesis severe dilatation of right and left atrium moderate to severe MR moderate TR aortic stenosis and trivial pericardial effusion  At the time of discharge his spironolactone  losartan  and metoprolol  was continued But his SGLT 2 was discontinued because he is frail Patient diuresed well while in the emergency department and his Lasix  was changed to as needed  Patient have had troubles with urinary retention and a Enterococcus UTI, Foley catheter was placed Patient was discharged on  oral ampicillin   Denies significant ETOH intake   Does not smoke      Regarding pertinent Chronic problems:    Hyperlipidemia -  on statins Lipitor (atorvastatin )  Lipid Panel     Component Value Date/Time   CHOL 104 10/24/2023 0948   TRIG 88 10/24/2023 0948   HDL 61 10/24/2023 0948   CHOLHDL 1.7 10/24/2023 0948   LDLCALC 26 10/24/2023 0948   LABVLDL 17 10/24/2023 0948     HTN on losartan  metoprolol  Aldactone    chronic CHF diastolic/systolic/ combined - last echo  Recent Results (from the past 56199 hours)  ECHOCARDIOGRAM COMPLETE   Collection Time: 08/01/23  2:48 PM  Result Value   AV Area mean vel 1.51   AR max vel 1.52   AV Area VTI 1.43   Ao pk vel 1.35   AV Mean grad 4.0   AV Peak grad 7.3   Single Plane A2C EF 47.2   Single Plane A4C EF 42.4   Calc EF 46.6   Est EF 35 - 40%   Narrative      ECHOCARDIOGRAM REPORT       IMPRESSIONS    1. No left ventricular thrombus is seen. Left ventricular ejection fraction, by estimation, is 35 to 40%. The left ventricle has moderately decreased function. The left ventricle demonstrates global hypokinesis. The left ventricular internal cavity size  was mildly dilated. Left ventricular diastolic parameters are consistent with Grade II diastolic dysfunction (pseudonormalization). Elevated left atrial pressure.  2. Right ventricular systolic function is normal. The  right ventricular size is mildly enlarged. There is severely elevated pulmonary artery systolic pressure. The estimated right ventricular systolic pressure is 72.8 mmHg.  3. Left atrial size was severely dilated.  4. Right atrial size was severely dilated.  5. The mitral valve is abnormal. Moderate to severe mitral valve regurgitation.  6. Tricuspid valve regurgitation is moderate.  7. The aortic valve is grossly normal. Aortic valve regurgitation is trivial. No aortic stenosis is present.  8. The inferior vena cava is dilated in size with <50% respiratory variability,  suggesting right atrial pressure of 15 mmHg.           CAD  - On Aspirin , statin, betablocker,                  -  followed by cardiology                    COPD - on duoneb    A. Fib -   atrial fibrillation CHA2DS2 vas score    5     current  on anticoagulation with   Eliquis ,     -  Rate control:  Currently controlled with  Toprolol,    BPH on Flomax     Chronic anemia - baseline hg Hemoglobin & Hematocrit  Recent Labs    12/09/23 0659 12/11/23 0909 12/12/23 1720  HGB 12.3* 13.0 12.5*    While in ER:   Respiratory distress improved with minimal intervention somewhat better DuoNeb patient started on IV steroids also given a dose of Lasix     Lab Orders         Comprehensive metabolic panel         CBC with Differential         Brain natriuretic peptide         I-Stat venous blood gas, ED       CXR - Cardiomegaly with interstitial thickening suspicious for pulmonary edema. Small bilateral pleural effusions.    Following Medications were ordered in ER: Medications  ipratropium-albuterol  (DUONEB) 0.5-2.5 (3) MG/3ML nebulizer solution 3 mL (3 mLs Nebulization Given 12/12/23 1734)  methylPREDNISolone  sodium succinate (SOLU-MEDROL ) 125 mg/2 mL injection 125 mg (125 mg Intravenous Given 12/12/23 1735)  furosemide  (LASIX ) injection 40 mg (40 mg Intravenous Given 12/12/23 1841)       ED Triage Vitals  Encounter Vitals Group     BP 12/12/23 1707 121/85     Girls Systolic BP Percentile --      Girls Diastolic BP Percentile --      Boys Systolic BP Percentile --      Boys Diastolic BP Percentile --      Pulse Rate 12/12/23 1707 70     Resp 12/12/23 1707 (!) 28     Temp 12/12/23 1712 97.8 F (36.6 C)     Temp Source 12/12/23 1712 Oral     SpO2 12/12/23 1707 100 %     Weight 12/12/23 1710 121 lb 0.5 oz (54.9 kg)     Height 12/12/23 1710 5' 9 (1.753 m)     Head Circumference --      Peak Flow --      Pain Score 12/12/23 1710 0     Pain Loc --      Pain Education --       Exclude from Growth Chart --   UFJK(75)@     _________________________________________ Significant initial  Findings: Abnormal Labs Reviewed  COMPREHENSIVE METABOLIC PANEL WITH GFR - Abnormal; Notable for  the following components:      Result Value   Sodium 134 (*)    CO2 21 (*)    Glucose, Bld 141 (*)    BUN 36 (*)    Calcium  8.2 (*)    Total Protein 5.2 (*)    Albumin 2.7 (*)    AST 55 (*)    ALT 95 (*)    All other components within normal limits  CBC WITH DIFFERENTIAL/PLATELET - Abnormal; Notable for the following components:   WBC 14.1 (*)    RBC 3.95 (*)    Hemoglobin 12.5 (*)    HCT 38.4 (*)    Neutro Abs 13.0 (*)    Lymphs Abs 0.3 (*)    Abs Immature Granulocytes 0.11 (*)    All other components within normal limits  BRAIN NATRIURETIC PEPTIDE - Abnormal; Notable for the following components:   B Natriuretic Peptide 418.4 (*)    All other components within normal limits      _________________________ Troponin  ordered Cardiac Panel (last 3 results) Recent Labs    12/12/23 1720 12/12/23 1926  TROPONINIHS 14 15    ECG: Ordered  Appears paced on tele  BNP (last 3 results) Recent Labs    12/02/23 0423 12/09/23 1401 12/12/23 1720  BNP 1,075.2* 330.1* 418.4*     The recent clinical data is shown below. Vitals:   12/12/23 1720 12/12/23 1815 12/12/23 1900 12/12/23 1934  BP:  109/63 112/69   Pulse:  70 70   Resp:  19 (!) 26   Temp:      TempSrc:      SpO2: 100% 100% 100% 100%  Weight:      Height:          WBC     Component Value Date/Time   WBC 14.1 (H) 12/12/2023 1720   LYMPHSABS 0.3 (L) 12/12/2023 1720   MONOABS 0.6 12/12/2023 1720   EOSABS 0.0 12/12/2023 1720   BASOSABS 0.0 12/12/2023 1720     Lactic Acid, Venous ordered    Procalcitonin   Ordered      UA  not ordered     Susceptibility data from last 90 days. Collected Specimen Info Organism AMPICILLIN  Nitrofurantoin Susc lslt TELAVANCIN  12/08/23 Urine, Random Enterococcus  faecalis  S  S  S  VBG pending   __________________________________________________________ Recent Labs  Lab 12/06/23 0304 12/08/23 0927 12/10/23 0756 12/12/23 0955 12/12/23 1720  NA 139 139 141 139 134*  K 4.5 4.8 4.7 4.6 5.0  CO2 28 25 26 28  21*  GLUCOSE 130* 141* 101* 134* 141*  BUN 41* 41* 40* 34* 36*  CREATININE 1.08 0.88 0.81 0.99 0.83  CALCIUM  8.1* 8.3* 8.4* 8.4* 8.2*  MG 2.4  --   --   --   --     Cr   stable,   Lab Results  Component Value Date   CREATININE 0.83 12/12/2023   CREATININE 0.99 12/12/2023   CREATININE 0.81 12/10/2023    Recent Labs  Lab 12/12/23 1720  AST 55*  ALT 95*  ALKPHOS 69  BILITOT 1.0  PROT 5.2*  ALBUMIN 2.7*   Lab Results  Component Value Date   CALCIUM  8.2 (L) 12/12/2023    Plt: Lab Results  Component Value Date   PLT 187 12/12/2023    Recent Labs  Lab 12/06/23 0304 12/09/23 0659 12/11/23 0909 12/12/23 1720  WBC 9.0 14.7* 12.9* 14.1*  NEUTROABS  --   --   --  13.0*  HGB 12.3* 12.3* 13.0 12.5*  HCT 38.0* 37.7* 40.4 38.4*  MCV 97.2 97.4 98.1 97.2  PLT 181 177 192 187    HG/HCT   stable,     Component Value Date/Time   HGB 12.5 (L) 12/12/2023 1720   HGB 12.2 (L) 08/24/2023 1120   HCT 38.4 (L) 12/12/2023 1720   HCT 37.5 08/24/2023 1120   MCV 97.2 12/12/2023 1720   MCV 98 (H) 08/24/2023 1120    _______________________________________________ Hospitalist was called for admission for  COPD exacerbation    The following Work up has been ordered so far:  Orders Placed This Encounter  Procedures   DG Chest Portable 1 View   Comprehensive metabolic panel   CBC with Differential   Brain natriuretic peptide   Consult to hospitalist   I-Stat venous blood gas, ED   ED EKG     OTHER Significant initial  Findings:  labs showing:     DM  labs:  HbA1C: No results for input(s): HGBA1C in the last 8760 hours.     CBG (last 3)  No results for input(s): GLUCAP in the last 72 hours.        Cultures:     Component Value Date/Time   SDES URINE, RANDOM 12/08/2023 1129   SPECREQUEST  12/08/2023 1129    NONE Reflexed from [id number] Performed at [facility], [address] [place], [place] [address]    CULT >=100,000 COLONIES/mL ENTEROCOCCUS FAECALIS (A) 12/08/2023 1129   REPTSTATUS 12/10/2023 FINAL 12/08/2023 1129     Radiological Exams on Admission: DG Chest Portable 1 View Result Date: 12/12/2023 CLINICAL DATA:  Wheezing.  Shortness of breath. EXAM: PORTABLE CHEST 1 VIEW COMPARISON:  12/09/2023, CT 12/01/2023 FINDINGS: Left-sided pacemaker in place. The heart is enlarged. Aortic atherosclerosis and tortuosity. Improved right lung base aeration. There are small bilateral pleural effusions. Interstitial thickening suspicious for pulmonary edema. No pneumothorax. Chronic shoulder arthropathy. IMPRESSION: 1. Cardiomegaly with interstitial thickening suspicious for pulmonary edema. Small bilateral pleural effusions. 2. Improved right lung base aeration from prior. Electronically Signed   By: Andrea Gasman M.D.   On: 12/12/2023 18:11   _______________________________________________________________________________________________________ Latest  Blood pressure 112/69, pulse 70, temperature 97.8 F (36.6 C), temperature source Oral, resp. rate (!) 26, height 5' 9 (1.753 m), weight 54.9 kg, SpO2 100%.   Vitals  labs and radiology finding personally reviewed  Review of Systems:    Pertinent positives include:  , fatigue, shortness of breath at rest.   dyspnea on exertion, Constitutional:  No weight loss, night sweats, Fevers, chills weight loss  HEENT:  No headaches, Difficulty swallowing,Tooth/dental problems,Sore throat,  No sneezing, itching, ear ache, nasal congestion, post nasal drip,  Cardio-vascular:  No chest pain, Orthopnea, PND, anasarca, dizziness, palpitations.no Bilateral lower extremity swelling  GI:  No heartburn, indigestion, abdominal pain, nausea, vomiting, diarrhea,  change in bowel habits, loss of appetite, melena, blood in stool, hematemesis Resp:  no  No excess mucus, no productive cough, No non-productive cough, No coughing up of blood.No change in color of mucus.No wheezing. Skin:  no rash or lesions. No jaundice GU:  no dysuria, change in color of urine, no urgency or frequency. No straining to urinate.  No flank pain.  Musculoskeletal:  No joint pain or no joint swelling. No decreased range of motion. No back pain.  Psych:  No change in mood or affect. No depression or anxiety. No memory loss.  Neuro: no localizing neurological complaints, no tingling, no weakness, no  double vision, no gait abnormality, no slurred speech, no confusion  All systems reviewed and apart from HOPI all are negative _______________________________________________________________________________________________ Past Medical History:   Past Medical History:  Diagnosis Date   Asthma    Hypertension       Past Surgical History:  Procedure Laterality Date   CATARACT EXTRACTION Left 2007   double hernia repair  2010   HERNIA REPAIR  1984   KNEE SURGERY Left 1992   PACEMAKER IMPLANT N/A 08/30/2023   Procedure: PACEMAKER IMPLANT;  Surgeon: Kennyth Chew, MD;  Location: [facility] INVASIVE CV LAB;  Service: Cardiovascular;  Laterality: N/A;   RIGHT/LEFT HEART CATH AND CORONARY ANGIOGRAPHY N/A 08/10/2023   Procedure: RIGHT/LEFT HEART CATH AND CORONARY ANGIOGRAPHY;  Surgeon: [place]az[place], Peter M, MD;  Location: [facility] INVASIVE CV LAB;  Service: Cardiovascular;  Laterality: N/A;   ROTATOR CUFF REPAIR Left 2001   ruptured blood vessel L eye Left 2008    Social History:  Ambulatory   walker     reports that he has never smoked. He has never used smokeless tobacco. He reports that he does not drink alcohol and does not use drugs.   Family History:   Family History  Problem Relation Age of Onset   Bronchitis Mother    Cancer Mother        ovarian    ______________________________________________________________________________________________ Allergies: No Known Allergies   Prior to Admission medications   Medication Sig Start Date End Date Taking? Authorizing Provider  acetaminophen  (TYLENOL ) 650 MG CR tablet Take 1,300 mg by mouth every 8 (eight) hours as needed for pain. Patient taking differently: Take 1,300 mg by mouth as needed for pain.    [provider]  ampicillin  (PRINCIPEN) 500 MG capsule Take 1 capsule (500 mg total) by mouth every 6 (six) hours for 20 doses. X 5 days 12/12/23 12/17/23  Rai, Nydia POUR, MD  apixaban  (ELIQUIS ) 2.5 MG TABS tablet Take 1 tablet (2.5 mg total) by mouth 2 (two) times daily. 11/02/23   Terra Fairy PARAS, PA-C  atorvastatin  (LIPITOR) 20 MG tablet TAKE 1 TABLET BY MOUTH EVERY DAY 10/31/23   Emelia Josefa HERO, NP  diclofenac  Sodium (VOLTAREN ) 1 % GEL Apply 2 g topically 4 (four) times daily. 12/12/23   Rai, Nydia POUR, MD  Ferrous Sulfate (IRON) 325 (65 Fe) MG TABS Take 1 tablet by mouth daily.    [provider]  furosemide  (LASIX ) 20 MG tablet Take 1 tablet (20 mg total) by mouth daily as needed for fluid or edema. 12/12/23 12/11/24  Rai, Nydia POUR, MD  hydrochlorothiazide (HYDRODIURIL) 25 MG tablet Take 25 mg by mouth daily.    [provider]  ipratropium-albuterol  (DUONEB) 0.5-2.5 (3) MG/3ML SOLN Take 3 mLs by nebulization every 4 (four) hours as needed (Shortness of breath, wheezing). 12/12/23   Rai, Nydia POUR, MD  losartan  (COZAAR ) 25 MG tablet Take 25 mg by mouth daily. 06/21/23   [provider]  metoprolol  succinate (TOPROL  XL) 25 MG 24 hr tablet Take 1 tablet (25 mg) by mouth once daily AT BEDTIME. Hold for blood pressure < 100/50 09/14/23   Ursuy, Renee Lynn, PA-C  Multiple Vitamins-Minerals (MULTIVITAMIN ADULT) TABS Take 1 tablet by mouth daily.    [provider]  Omega-3 Fatty Acids (FISH OIL PO) Take 350 mg by mouth daily.    [provider]   ondansetron  (ZOFRAN ) 4 MG tablet Take 1 tablet (4 mg total) by mouth every 6 (six) hours as needed for nausea. 12/12/23  Rai, Ripudeep MARLA, MD  predniSONE  (DELTASONE ) 10 MG tablet Prednisone  dosing: Take  Prednisone  30mg  (3 tabs) x 2 days, then 20mg  (2 tabs) x 2days, then 10mg  (1 tab) x 2days, then OFF. 12/12/23   Rai, Ripudeep K, MD  spironolactone  (ALDACTONE ) 25 MG tablet Take 0.5 tablets (12.5 mg total) by mouth daily. 12/13/23   Rai, Nydia MARLA, MD  tamsulosin  (FLOMAX ) 0.4 MG CAPS capsule Take 0.4 mg by mouth daily after supper.    [provider]  traMADol  (ULTRAM ) 50 MG tablet Take 1 tablet (50 mg total) by mouth every 12 (twelve) hours as needed for severe pain (pain score 7-10). 12/12/23   Rai, Ripudeep K, MD  TRELEGY ELLIPTA 100-62.5-25 MCG/ACT AEPB Inhale 1 puff into the lungs daily. 06/21/23   [provider]    ___________________________________________________________________________________________________ Physical Exam:    12/12/2023    7:00 PM 12/12/2023    6:15 PM 12/12/2023    5:10 PM  Vitals with BMI  Height   5' 9  Weight   121 lbs 1 oz  BMI   17.87  Systolic 112 109   Diastolic 69 63   Pulse 70 70      1. General:  in No  Acute distress   Chronically ill   -appearing 2. Psychological: Alert and   Oriented 3. Head/ENT:  Dry Mucous Membranes                          Head Non traumatic, neck supple                     Poor Dentition 4. SKIN: decreased Skin turgor,  Skin clean Dry and intact no rash    5. Heart: Regular rate and rhythm no  Murmur, no Rub or gallop 6. Lungs:   occasional wheezes some  crackles   7. Abdomen: Soft,  non-tender, Non distended bowel sounds present 8. Lower extremities: no clubbing, cyanosis, no  edema 9. Neurologically Grossly intact, moving all 4 extremities equally  10. MSK: Normal range of motion    Chart has been reviewed   _____________________________________________________________________________________  Assessment/Plan  89 y.o. male with medical history significant of A-fib, HTN, CAD pulmonary hypertension, hyponatremia, spinal stenosis COPD, hypercholesteremia, protein calorie malnutrition  Admitted for acute respiratory failure secondary to systolic CHF exacerbation COPD exacerbation   Present on Admission:  COPD exacerbation (HCC)  Acute on chronic systolic CHF (congestive heart failure) (HCC)  Essential hypertension  Coronary artery disease involving native coronary artery of native heart  Atrial fibrillation, chronic (HCC)  Spinal stenosis  COPD with acute exacerbation (HCC)  Pure hypercholesterolemia  AV block  Urinary retention  Elevated LFTs  Acute respiratory failure with hypoxia (HCC)     Acute on chronic systolic CHF (congestive heart failure) (HCC)  patient appears to be euvolemic clinically with somewhat soft blood pressures after Lasix  was administered.  He have had significant diuresis just recently but chest x-ray did show some interstitial thickening suspicious for pulmonary edema initially we will see how patient responds to 1 dose of Lasix  in the emergency department he may need additional diuresis tomorrow continue to monitor fluid status strict I's and O's patient just had recent echogram done troponin not elevated  Essential hypertension Actually somewhat soft blood pressures hold blood pressure meds for now  Coronary artery disease involving native coronary artery of native heart Continue Lipitor 20 mg a day When blood pressure improves will restart  metoprolol   Atrial fibrillation, chronic (HCC) Chronic stable continue Eliquis  2.5 mg p.o. twice daily Start metoprolol  blood pressure improved  Spinal stenosis Chronic stable  COPD with acute exacerbation (HCC) Still needs frequent nebulizers also noted that patient seems to have intermittent increase in respiratory  drive in the setting of feeling anxious he is able to be redirected which is somewhat helpful.  Patient states that he has frequent episodes like this. Continue management for COPD exacerbation for now  Pure hypercholesterolemia Continue Lipitor 20 mg a day  AV block Status post pacemaker  Urinary retention Continue to maintain Foley catheter History of Enterococcus UTI continue antibiotics  Elevated LFTs Mild elevation could be in the setting of CHF.  Continue to monitor Also check CK Noted albumin down to 2.7 suspect poor nutritional status check prealbumin May benefit from right upper quadrant ultrasound to further evaluate liver function  Acute respiratory failure with hypoxia (HCC)  this patient has acute respiratory failure with Hypoxia  as documented by the presence of following: O2 saturatio< 90% on RA   Likely due to:  CHF exacerbation vs COPD exacerbation,   Provide O2 therapy and titrate as needed  Continuous pulse ox   check Pulse ox with ambulation prior to discharge   may need  TC consult for home O2 set up    flutter valve ordered    Other plan as per orders.  DVT prophylaxis: Eliquis     Code Status: DNR/DNI  as per patient   I had personally discussed CODE STATUS with patient  ACP   none    Family Communication:   Family not at  Bedside    Diet dysphagia 2 diet with nectar thick fluids   Disposition Plan:                             Back to current facility when stable                           Following barriers for discharge:                                                   Work of breathing improves       Consult Orders  (From admission, onward)           Start     Ordered   12/12/23 1834  Consult to hospitalist  Once       Provider:  (Not yet assigned)  Question Answer Comment  Place call to: [facility]   Reason for Consult Admit      12/12/23 1833                               Transition of care consulted                    Nutrition    consulted                Consults called:    NONE   Admission status:  ED Disposition     ED Disposition  Admit   Condition  --   Comment  Hospital Area: [facility] [[id number]]  Level of  Care: Progressive [102]  Admit to Progressive based on following criteria: RESPIRATORY PROBLEMS hypoxemic/hypercapnic respiratory failure that is responsive to NIPPV (BiPAP) or High Flow Nasal Cannula (6-80 lpm). Frequent assessment/intervention, no > Q2 hrs < Q4 hrs, to maintain oxygenation and pulmonary hygiene.  May admit patient to [facility] or [facility] if equivalent level of care is available:: No  Covid Evaluation: Asymptomatic - no recent exposure (last 10 days) testing not required  Diagnosis: COPD exacerbation [facility]) [[id number]]  Admitting Physician: Derrin Currey [[id number]]  Attending Physician: Arvell Pulsifer [[id number]]  Certification:: I certify this patient will need inpatient services for at least 2 midnights  Expected Medical Readiness: 12/14/2023            inpatient     I Expect 2 midnight stay secondary to severity of patient's current illness need for inpatient interventions justified by the following:  hemodynamic instability despite optimal treatment (tachycardia  hypotension  tachypnea  hypoxia )   Severe lab/radiological/exam abnormalities including:   Systolic CHF COPD exacerbation and extensive comorbidities including: CHF  COPD/asthma  That are currently affecting medical management.   I expect  patient to be hospitalized for 2 midnights requiring inpatient medical care.  Patient is at high risk for adverse outcome (such as loss of life or disability) if not treated.  Indication for inpatient stay as follows:   Hemodynamic instability despite maximal medical therapy,   New or worsening hypoxia    Need for IV diuretics    Level of care    progressive    Clancy Leiner 12/12/2023, 10:22 PM    [facility]      after 2 AM please page floor coverage   If 7AM-7PM, please contact the day team taking care of the patient using [email or web address]

## 2023-12-12 NOTE — ED Notes (Signed)
 Patient transported to Ultrasound

## 2023-12-12 NOTE — TOC Transition Note (Signed)
 Transition of Care [facility]) - Discharge Note   Patient Details  Name: Guy Espinoza MRN: [id number] Date of Birth: 04-May-1926  Transition of Care [facility]) CM/SW Contact:  Montie LOISE Louder, LCSW Phone Number: 12/12/2023, 1:03 PM   Clinical Narrative:     Patient will Discharge to: [facility] Discharge Date: 12/12/2023 Family Notified: spouse Transport By: ROME  Per MD patient is ready for discharge. RN, patient, and facility notified of discharge. Discharge Summary sent to facility. RN given number for [phone]. Ambulance transport requested for patient.   Clinical Social Worker signing off.  Montie Louder, MSW, LCSW Clinical Social Worker      Final next level of care: Skilled Nursing Facility Barriers to Discharge: Barriers Resolved   Patient Goals and CMS Choice Patient states their goals for this hospitalization and ongoing recovery are:: return home   Choice offered to / list presented to : Spouse      Discharge Placement              Patient chooses bed at: [facility] Patient to be transferred to facility by: [facility] Name of family member notified: spouse Patient and family notified of of transfer: 12/12/23  Discharge Plan and Services Additional resources added to the After Visit Summary for   In-house Referral: Clinical Social Work Discharge Planning Services: CM Consult Post Acute Care Choice: Home Health, Durable Medical Equipment          DME Arranged: Oxygen         HH Arranged: PT, RN, OT          Social Drivers of Health (SDOH) Interventions SDOH Screenings   Food Insecurity: No Food Insecurity (12/02/2023)  Housing: Low Risk  (12/02/2023)  Transportation Needs: No Transportation Needs (12/02/2023)  Utilities: Patient Declined (12/02/2023)  Social Connections: Unknown (12/02/2023)  Tobacco Use: Low Risk  (12/01/2023)     Readmission Risk Interventions     No data to display

## 2023-12-12 NOTE — TOC Progression Note (Signed)
 Transition of Care [facility]) - Progression Note    Patient Details  Name: Guy Espinoza MRN: [id number] Date of Birth: May 01, 1926  Transition of Care [facility]) CM/SW Contact  Montie LOISE Louder, [place] Phone Number: 12/12/2023, 9:02 AM  Clinical Narrative:     Received insurance authorization  [id number] [phone] reference #[id number]  9-15 to 9/17  Montie Louder, MSW, LCSW Clinical Social Worker     Expected Discharge Plan: Skilled Nursing Facility Barriers to Discharge: Continued Medical Work up               Expected Discharge Plan and Services In-house Referral: Clinical Social Work Discharge Planning Services: [employer] Consult Post Acute Care Choice: Home Health, Durable Medical Equipment Living arrangements for the past 2 months: Single Family Home                 DME Arranged: Oxygen         HH Arranged: PT, RN, OT           Social Drivers of Health (SDOH) Interventions SDOH Screenings   Food Insecurity: No Food Insecurity (12/02/2023)  Housing: Low Risk  (12/02/2023)  Transportation Needs: No Transportation Needs (12/02/2023)  Utilities: Patient Declined (12/02/2023)  Social Connections: Unknown (12/02/2023)  Tobacco Use: Low Risk  (12/01/2023)    Readmission Risk Interventions     No data to display

## 2023-12-12 NOTE — Subjective & Objective (Signed)
 Patient was just discharged from the hospital today for CHF was negative 12 L  He is on 2L at baseline  Went to SNF he was hypoxic and wheezy  Got duo-nebs now back to 2 L  His CXR did show some pulmonary edema

## 2023-12-12 NOTE — ED Notes (Signed)
 CCMD contacted to verify patient on cardiac monitoring.

## 2023-12-12 NOTE — ED Provider Notes (Signed)
 [place] EM[facility] DEPARTMENT AT [facility] Provider Note  CSN: [id number] Arrival date & time: 12/12/23 1659  Chief Complaint(s) Shortness of Breath  HPI Guy Espinoza is a 89 y.o. male history of CHF, COPD, atrial fibrillation on Eliquis , coronary artery disease, just admitted for COPD/CHF exacerbations, presenting for shortness of breath.  Patient was discharged today to SNF.  Shortly after arriving SNF patient developed recurrent shortness of breath, difficulty breathing.  Patient was discharged on 2 L nasal cannula, paramedics found patient to be saturating in the 80s and placed him on nonrebreather.  Patient just reports severe dyspnea.  No leg swelling or chest pain.  Symptoms similar to when he was in the hospital.  No fevers or chills, cough since leaving hospital.   Past Medical History Past Medical History:  Diagnosis Date   Asthma    Hypertension    Patient Active Problem List   Diagnosis Date Noted   Protein-calorie malnutrition, severe 12/05/2023   Benign prostatic hyperplasia without urinary obstruction 12/02/2023   Disability of walking 12/02/2023   Essential hypertension 12/02/2023   Pure hypercholesterolemia 12/02/2023   Prediabetes 12/02/2023   Acute on chronic systolic CHF (congestive heart failure) (HCC) 12/02/2023   Atrial fibrillation, chronic (HCC) 12/02/2023   COPD with acute exacerbation (HCC) 12/02/2023   Hypernatremia 12/02/2023   AV block 08/24/2023   Coronary artery disease involving native coronary artery of native heart 08/24/2023   HFrEF (heart failure with reduced ejection fraction) (HCC) 08/24/2023   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/08/2018   Spinal stenosis 09/15/2014   Claudication in peripheral vascular disease (HCC) 09/13/2013   Home Medication(s) Prior to Admission medications   Medication Sig Start Date End Date Taking? Authorizing Provider  acetaminophen  (TYLENOL ) 650 MG CR tablet Take 1,300 mg by mouth every 8 (eight) hours as  needed for pain. Patient taking differently: Take 1,300 mg by mouth as needed for pain.    [provider]  ampicillin  (PRINCIPEN) 500 MG capsule Take 1 capsule (500 mg total) by mouth every 6 (six) hours for 20 doses. X 5 days 12/12/23 12/17/23  Rai, Nydia POUR, MD  apixaban  (ELIQUIS ) 2.5 MG TABS tablet Take 1 tablet (2.5 mg total) by mouth 2 (two) times daily. 11/02/23   Terra Fairy PARAS, PA-C  atorvastatin  (LIPITOR) 20 MG tablet TAKE 1 TABLET BY MOUTH EVERY DAY 10/31/23   Emelia Josefa HERO, NP  diclofenac  Sodium (VOLTAREN ) 1 % GEL Apply 2 g topically 4 (four) times daily. 12/12/23   Rai, Nydia POUR, MD  Ferrous Sulfate (IRON) 325 (65 Fe) MG TABS Take 1 tablet by mouth daily.    [provider]  furosemide  (LASIX ) 20 MG tablet Take 1 tablet (20 mg total) by mouth daily as needed for fluid or edema. 12/12/23 12/11/24  Rai, Nydia POUR, MD  hydrochlorothiazide (HYDRODIURIL) 25 MG tablet Take 25 mg by mouth daily.    [provider]  ipratropium-albuterol  (DUONEB) 0.5-2.5 (3) MG/3ML SOLN Take 3 mLs by nebulization every 4 (four) hours as needed (Shortness of breath, wheezing). 12/12/23   Rai, Nydia POUR, MD  losartan  (COZAAR ) 25 MG tablet Take 25 mg by mouth daily. 06/21/23   [provider]  metoprolol  succinate (TOPROL  XL) 25 MG 24 hr tablet Take 1 tablet (25 mg) by mouth once daily AT BEDTIME. Hold for blood pressure < 100/50 09/14/23   Ursuy, Renee Lynn, PA-C  Multiple Vitamins-Minerals (MULTIVITAMIN ADULT) TABS Take 1 tablet by mouth daily.    [provider]  Omega-3  Fatty Acids (FISH OIL PO) Take 350 mg by mouth daily.    [provider]  ondansetron  (ZOFRAN ) 4 MG tablet Take 1 tablet (4 mg total) by mouth every 6 (six) hours as needed for nausea. 12/12/23   Rai, Nydia POUR, MD  predniSONE  (DELTASONE ) 10 MG tablet Prednisone  dosing: Take  Prednisone  30mg  (3 tabs) x 2 days, then 20mg  (2 tabs) x 2days, then 10mg  (1 tab) x 2days, then OFF. 12/12/23   Rai,  Ripudeep K, MD  spironolactone  (ALDACTONE ) 25 MG tablet Take 0.5 tablets (12.5 mg total) by mouth daily. 12/13/23   Rai, Nydia POUR, MD  tamsulosin  (FLOMAX ) 0.4 MG CAPS capsule Take 0.4 mg by mouth daily after supper.    [provider]  traMADol  (ULTRAM ) 50 MG tablet Take 1 tablet (50 mg total) by mouth every 12 (twelve) hours as needed for severe pain (pain score 7-10). 12/12/23   Rai, Ripudeep K, MD  TRELEGY ELLIPTA 100-62.5-25 MCG/ACT AEPB Inhale 1 puff into the lungs daily. 06/21/23   [provider]                                                                                                                                    Past Surgical History Past Surgical History:  Procedure Laterality Date   CATARACT EXTRACTION Left 2007   double hernia repair  2010   HERNIA REPAIR  1984   KNEE SURGERY Left 1992   PACEMAKER IMPLANT N/A 08/30/2023   Procedure: PACEMAKER IMPLANT;  Surgeon: Kennyth Chew, MD;  Location[facility] INVASIVE CV LAB;  Service: Cardiovascular;  Laterality: N/A;   RIGHT/LEFT HEART CATH AND CORONARY ANGIOGRAPHY N/A 08/10/2023   Procedure: RIGHT/LEFT HEART CATH AND CORONARY ANGIOGRAPHY;  Surgeon: [place], Peter M, MD;  Location: [facility] INVASIVE CV LAB;  Service: Cardiovascular;  Laterality: N/A;   ROTATOR CUFF REPAIR Left 2001   ruptured blood vessel L eye Left 2008   Family History Family History  Problem Relation Age of Onset   Bronchitis Mother    Cancer Mother        ovarian    Social History Social History   Tobacco Use   Smoking status: Never   Smokeless tobacco: Never  Vaping Use   Vaping status: Never Used  Substance Use Topics   Alcohol use: Never   Drug use: Never   Allergies Patient has no known allergies.  Review of Systems Review of Systems  All other systems reviewed and are negative.   Physical Exam Vital Signs  I have reviewed the triage vital signs BP 117/69   Pulse 69   Temp 97.8 F (36.6 C) (Oral)   Resp (!) 24   Ht 5'  9 (1.753 m)   Wt 54.9 kg   SpO2 100%   BMI 17.87 kg/m  Physical Exam Vitals and nursing note reviewed.  Constitutional:      General: He is not in acute distress.  Appearance: Normal appearance.  HENT:     Mouth/Throat:     Mouth: Mucous membranes are moist.  Eyes:     Conjunctiva/sclera: Conjunctivae normal.  Cardiovascular:     Rate and Rhythm: Normal rate and regular rhythm.  Pulmonary:     Effort: Tachypnea, accessory muscle usage and respiratory distress present.     Breath sounds: Examination of the right-upper field reveals wheezing. Examination of the left-upper field reveals wheezing. Examination of the right-middle field reveals wheezing. Examination of the left-middle field reveals wheezing. Examination of the right-lower field reveals rales. Examination of the left-lower field reveals rales. Wheezing and rales present.  Abdominal:     General: Abdomen is flat.     Palpations: Abdomen is soft.     Tenderness: There is no abdominal tenderness.  Musculoskeletal:     Right lower leg: No edema.     Left lower leg: No edema.  Skin:    General: Skin is warm and dry.     Capillary Refill: Capillary refill takes less than 2 seconds.  Neurological:     Mental Status: He is alert and oriented to person, place, and time. Mental status is at baseline.  Psychiatric:        Mood and Affect: Mood normal.        Behavior: Behavior normal.     ED Results and Treatments Labs (all labs ordered are listed, but only abnormal results are displayed) Labs Reviewed  COMPREHENSIVE METABOLIC PANEL WITH GFR - Abnormal; Notable for the following components:      Result Value   Sodium 134 (*)    CO2 21 (*)    Glucose, Bld 141 (*)    BUN 36 (*)    Calcium  8.2 (*)    Total Protein 5.2 (*)    Albumin 2.7 (*)    AST 55 (*)    ALT 95 (*)    All other components within normal limits  CBC WITH DIFFERENTIAL/PLATELET - Abnormal; Notable for the following components:   WBC 14.1 (*)     RBC 3.95 (*)    Hemoglobin 12.5 (*)    HCT 38.4 (*)    Neutro Abs 13.0 (*)    Lymphs Abs 0.3 (*)    Abs Immature Granulocytes 0.11 (*)    All other components within normal limits  BRAIN NATRIURETIC PEPTIDE - Abnormal; Notable for the following components:   B Natriuretic Peptide 418.4 (*)    All other components within normal limits  I-STAT VENOUS BLOOD GAS, ED  TROPONIN I (HIGH SENSITIVITY)  TROPONIN I (HIGH SENSITIVITY)                                                                                                                          Radiology DG Chest Portable 1 View Result Date: 12/12/2023 CLINICAL DATA:  Wheezing.  Shortness of breath. EXAM: PORTABLE CHEST 1 VIEW COMPARISON:  12/09/2023, CT 12/01/2023 FINDINGS: Left-sided pacemaker in place. The heart is enlarged.  Aortic atherosclerosis and tortuosity. Improved right lung base aeration. There are small bilateral pleural effusions. Interstitial thickening suspicious for pulmonary edema. No pneumothorax. Chronic shoulder arthropathy. IMPRESSION: 1. Cardiomegaly with interstitial thickening suspicious for pulmonary edema. Small bilateral pleural effusions. 2. Improved right lung base aeration from prior. Electronically Signed   By: Andrea Gasman M.D.   On: 12/12/2023 18:11    Pertinent labs & imaging results that were available during my care of the patient were reviewed by me and considered in my medical decision making (see MDM for details).  Medications Ordered in ED Medications  ipratropium-albuterol  (DUONEB) 0.5-2.5 (3) MG/3ML nebulizer solution 3 mL (3 mLs Nebulization Given 12/12/23 1734)  methylPREDNISolone  sodium succinate (SOLU-MEDROL ) 125 mg/2 mL injection 125 mg (125 mg Intravenous Given 12/12/23 1735)  furosemide  (LASIX ) injection 40 mg (40 mg Intravenous Given 12/12/23 1841)                                                                                                                                      Procedures Procedures  (including critical care time)  Medical Decision Making / ED Course   MDM:  89 year old presenting to the emergency department shortness of breath.  Patient was just discharged from the hospital today.  He presents again for continued shortness of breath.  Was found to be hypoxic with the paramedics.  He has increased work of breathing.  No significant lower extremity swelling.  Suspect possible combination of CHF and COPD which is why patient was previously in the hospital as well.  He did have signs of wheezing and possible trace crackles.  Initially received DuoNeb and steroids and felt better after this.  His BNP was slightly elevated and chest x-ray showed evidence of CHF so we will also give Lasix .  Do not think that we would be able to discharge patient back to his SNF given his immediate bounce back to the hospital.  Low concern for other process such as pneumonia, pulmonary embolism, pneumothorax.  Patient is on chronic anticoagulation.  Chest x-ray without focal infiltrate and no pneumothorax.  Discussed with Dr. Silvester who will admit patient back into the hospital.      Additional history obtained: -Additional history obtained from family and ems -External records from outside source obtained and reviewed including: Chart review including previous notes, labs, imaging, consultation notes including inpt notes    Lab Tests: -I ordered, reviewed, and interpreted labs.   The pertinent results include:   Labs Reviewed  COMPREHENSIVE METABOLIC PANEL WITH GFR - Abnormal; Notable for the following components:      Result Value   Sodium 134 (*)    CO2 21 (*)    Glucose, Bld 141 (*)    BUN 36 (*)    Calcium  8.2 (*)    Total Protein 5.2 (*)    Albumin 2.7 (*)    AST 55 (*)    ALT 95 (*)  All other components within normal limits  CBC WITH DIFFERENTIAL/PLATELET - Abnormal; Notable for the following components:   WBC 14.1 (*)    RBC 3.95 (*)     Hemoglobin 12.5 (*)    HCT 38.4 (*)    Neutro Abs 13.0 (*)    Lymphs Abs 0.3 (*)    Abs Immature Granulocytes 0.11 (*)    All other components within normal limits  BRAIN NATRIURETIC PEPTIDE - Abnormal; Notable for the following components:   B Natriuretic Peptide 418.4 (*)    All other components within normal limits  I-STAT VENOUS BLOOD GAS, ED  TROPONIN I (HIGH SENSITIVITY)  TROPONIN I (HIGH SENSITIVITY)    Notable for elevated BNP, normal troponin    Imaging Studies ordered: I ordered imaging studies including CXR On my interpretation imaging demonstrates pulm edema  I independently visualized and interpreted imaging. I agree with the radiologist interpretation   Medicines ordered and prescription drug management: Meds ordered this encounter  Medications   ipratropium-albuterol  (DUONEB) 0.5-2.5 (3) MG/3ML nebulizer solution 3 mL   methylPREDNISolone  sodium succinate (SOLU-MEDROL ) 125 mg/2 mL injection 125 mg    IV methylprednisolone  will be converted to either a q12h or q24h frequency with the same total daily dose (TDD).  Ordered Dose: 1 to 125 mg TDD; convert to: TDD q24h.  Ordered Dose: 126 to 250 mg TDD; convert to: TDD div q12h.  Ordered Dose: >250 mg TDD; DAW.   furosemide  (LASIX ) injection 40 mg    -I have reviewed the patients home medicines and have made adjustments as needed   Consultations Obtained: I requested consultation with the hospitalist,  and discussed lab and imaging findings as well as pertinent plan - they recommend: admission   Reevaluation: After the interventions noted above, I reevaluated the patient and found that their symptoms have improved  Co morbidities that complicate the patient evaluation  Past Medical History:  Diagnosis Date   Asthma    Hypertension       Dispostion: Disposition decision including need for hospitalization was considered, and patient admitted to the hospital.    Final Clinical Impression(s) / ED  Diagnoses Final diagnoses:  COPD exacerbation (HCC)  Acute on chronic congestive heart failure, unspecified heart failure type (HCC)     This chart was dictated using voice recognition software.  Despite best efforts to proofread,  errors can occur which can change the documentation meaning.    Francesca Elsie CROME, MD 12/12/23 2001

## 2023-12-12 NOTE — Assessment & Plan Note (Addendum)
 Mild elevation could be in the setting of CHF.  Continue to monitor Also check CK Noted albumin down to 2.7 suspect poor nutritional status check prealbumin May benefit from right upper quadrant ultrasound to further evaluate liver function

## 2023-12-12 NOTE — Assessment & Plan Note (Addendum)
 patient appears to be euvolemic clinically with somewhat soft blood pressures after Lasix  was administered.  He have had significant diuresis just recently but chest x-ray did show some interstitial thickening suspicious for pulmonary edema initially we will see how patient responds to 1 dose of Lasix  in the emergency department he may need additional diuresis tomorrow continue to monitor fluid status strict I's and O's patient just had recent echogram done troponin not elevated

## 2023-12-12 NOTE — Assessment & Plan Note (Signed)
 this patient has acute respiratory failure with Hypoxia  as documented by the presence of following: O2 saturatio< 90% on RA   Likely due to:  CHF exacerbation vs COPD exacerbation,   Provide O2 therapy and titrate as needed  Continuous pulse ox   check Pulse ox with ambulation prior to discharge   may need  TC consult for home O2 set up    flutter valve ordered

## 2023-12-12 NOTE — Discharge Summary (Signed)
 Physician Discharge Summary   Patient: Guy Espinoza MRN: [id number] DOB: 1926-09-30  Admit date:     12/01/2023  Discharge date: 12/12/23  Discharge Physician: Nydia Distance, MD    PCP: Nanci Senior, MD   Recommendations at discharge:   Ampicillin  500 mg p.o. every 6 hours for 5 days Prednisone  30 mg for 2 days, 20 mg for 2 days, 10 mg for 2 days, then off PT OT, SLP to follow at [facility] Foley catheter placed for acute urinary retention.  Complete antibiotics, voiding trial in 3-5 days. Dysphagia 2 diet with nectar thick liquids Recommend outpatient palliative to follow  Discharge Diagnoses:    Acute on chronic systolic CHF (congestive heart failure) (HCC)   Essential hypertension   Coronary artery disease involving native coronary artery of native heart   Atrial fibrillation, chronic (HCC)   Hypernatremia   Spinal stenosis   COPD with acute exacerbation (HCC)   Pure hypercholesterolemia   Protein-calorie malnutrition, severe    Hospital Course:  Patient is a 89 year old male with atrial fibrillation, hypertension, coronary artery disease and pulmonary hypertension, who presented with dyspnea.  Reported about 1 to 2 weeks of dyspnea on exertion, denied any chest pain, edema or palpitations. Positive cough and increased sputum production. On the day of admission he was evaluated at the EP clinic and was found hypoxemic, 87 to 88% on ambient air along with trace edema at his lower extremities. He was referred to the ED for further evaluation.  CT chest with centrilobular emphysema, small bilateral pleural effusions, with bilateral basal atelectasis. Stable 7 mm right upper lobe peri fissural subpleural pulmonary nodule, mild cardiomegaly.   Patient was placed on diuresis, bronchodilator therapy and steroids.  09/08 clinically improving, will need supplemental 02 at home.  09/09 patient with worsening increased of breathing and debility Chest radiograph with worsening right lower  lobe infiltrate, likely aspiration pneumonia.    Assessment and Plan:  Acute on chronic systolic CHF (congestive heart failure) (HCC) - 2D echo showed reduced LV systolic function with EF 35 to 40%, global hypokinesis, mild dilatated LV cavity, G2 DD, severe dilatation right and left atrium, moderate to severe MR, moderate TR, no aortic stenosis, trivial pericardial effusion -Continue spironolactone , losartan  and metoprolol  succinate.  - SGLT 2 inh discontinued due to frailty  - Negative balance of 12.6 L, last received IV Lasix  on 9/13.  Changed to Lasix  20 mg as needed for shortness of breath/fluid or edema    Acute hypoxemic respiratory failure, COPD exacerbation Aspiration PNA, not POA - due to acute cardiogenic pulmonary edema, aspiration PNA and COPD exacerbation. -O2 sats 97 to 100% on 2 L O2 via [place] -Chest x-ray 09/09 persistent right lower lobe infiltrate.  - Patient was placed on, Pulmicort , Brovana , DuoNebs, IV Unasyn   - On discharge, continue nebs as needed, Trelegy   Urinary retention, Enterococcus UTI -Foley catheter placed - Urine culture showed more than 100,000 colonies of Enterococcus faecalis - Transition to oral ampicillin  for 5 more days, -Voiding trial in 3 to 5 days   Essential hypertension, CAD BP stable, continue losartan , metoprolol , Aldactone    Atrial fibrillation, chronic (HCC) -Continue beta-blocker, apixaban     Hypernatremia Sodium improving, 148 on 9/5  - Continue Aldactone      Spinal stenosis, debility -PT evaluation recommended SNF     Hyperlipidemia Continue statin    Protein-calorie malnutrition, severe Nutrition Problem: Severe Malnutrition Etiology: chronic illness (CHF, dysphagia) Signs/Symptoms: severe fat depletion, severe muscle depletion Interventions: Ensure Enlive (each supplement  provides 350kcal and 20 grams of protein), Magic cup   Underweight, debility Estimated body mass index is 17.97 kg/m as calculated from the  following:   Height as of this encounter: 5' 9 (1.753 m).   Weight as of this encounter: 55.2 kg.       Pain control - [place]  Controlled Substance Reporting System database was reviewed. and patient was instructed, not to drive, operate heavy machinery, perform activities at heights, swimming or participation in water activities or provide baby-sitting services while on Pain, Sleep and Anxiety Medications; until their outpatient Physician has advised to do so again. Also recommended to not to take more than prescribed Pain, Sleep and Anxiety Medications.  Consultants: Palliative medicine Procedures performed: None Disposition: Skilled nursing facility Diet recommendation:   DISCHARGE MEDICATION: Allergies as of 12/12/2023   No Known Allergies      Medication List     PAUSE taking these medications    hydrochlorothiazide 25 MG tablet Wait to take this until your doctor or other care provider tells you to start again. Commonly known as: HYDRODIURIL Take 25 mg by mouth daily.       TAKE these medications    acetaminophen  650 MG CR tablet Commonly known as: TYLENOL  Take 1,300 mg by mouth every 8 (eight) hours as needed for pain. What changed: when to take this   ampicillin  500 MG capsule Commonly known as: PRINCIPEN Take 1 capsule (500 mg total) by mouth every 6 (six) hours for 20 doses. X 5 days   apixaban  2.5 MG Tabs tablet Commonly known as: ELIQUIS  Take 1 tablet (2.5 mg total) by mouth 2 (two) times daily.   atorvastatin  20 MG tablet Commonly known as: LIPITOR TAKE 1 TABLET BY MOUTH EVERY DAY   diclofenac  Sodium 1 % Gel Commonly known as: VOLTAREN  Apply 2 g topically 4 (four) times daily.   FISH OIL PO Take 350 mg by mouth daily.   furosemide  20 MG tablet Commonly known as: Lasix  Take 1 tablet (20 mg total) by mouth daily as needed for fluid or edema.   ipratropium-albuterol  0.5-2.5 (3) MG/3ML Soln Commonly known as: DUONEB Take 3 mLs by  nebulization every 4 (four) hours as needed (Shortness of breath, wheezing).   Iron 325 (65 Fe) MG Tabs Take 1 tablet by mouth daily.   losartan  25 MG tablet Commonly known as: COZAAR  Take 25 mg by mouth daily.   metoprolol  succinate 25 MG 24 hr tablet Commonly known as: Toprol  XL Take 1 tablet (25 mg) by mouth once daily AT BEDTIME. Hold for blood pressure < 100/50   Multivitamin Adult Tabs Take 1 tablet by mouth daily.   ondansetron  4 MG tablet Commonly known as: ZOFRAN  Take 1 tablet (4 mg total) by mouth every 6 (six) hours as needed for nausea.   predniSONE  10 MG tablet Commonly known as: DELTASONE  Prednisone  dosing: Take  Prednisone  30mg  (3 tabs) x 2 days, then 20mg  (2 tabs) x 2days, then 10mg  (1 tab) x 2days, then OFF.   spironolactone  25 MG tablet Commonly known as: ALDACTONE  Take 0.5 tablets (12.5 mg total) by mouth daily. Start taking on: December 13, 2023   tamsulosin  0.4 MG Caps capsule Commonly known as: FLOMAX  Take 0.4 mg by mouth daily after supper.   traMADol  50 MG tablet Commonly known as: ULTRAM  Take 1 tablet (50 mg total) by mouth every 12 (twelve) hours as needed for severe pain (pain score 7-10).   Trelegy Ellipta 100-62.5-25 MCG/ACT Aepb Generic drug: Fluticasone-Umeclidin-Vilant  Inhale 1 puff into the lungs daily.               Durable Medical Equipment  (From admission, onward)           Start     Ordered   12/04/23 1101  For home use only DME oxygen  Once       Question Answer Comment  Length of Need 12 Months   Mode or (Route) Nasal cannula   Liters per Minute 2   Frequency Continuous (stationary and portable oxygen unit needed)   Oxygen conserving device Yes   Oxygen delivery system Gas      12/04/23 1101              Discharge Care Instructions  (From admission, onward)           Start     Ordered   12/12/23 0000  If the dressing is still on your incision site when you go home, remove it on the third day  after your surgery date. Remove dressing if it begins to fall off, or if it is dirty or damaged before the third day.        12/12/23 1239            Discharge Exam: Filed Weights   12/10/23 0334 12/11/23 0318 12/12/23 0437  Weight: 52.5 kg 55.2 kg 54.9 kg   S: Wife at the bedside, no acute issues overnight.  No chest pain, acute shortness of breath, fevers or chills.  Feels better.  BP 118/78 (BP Location: Right Arm)   Pulse 73   Temp 97.7 F (36.5 C) (Oral)   Resp 20   Ht 5' 9 (1.753 m)   Wt 54.9 kg   SpO2 95%   BMI 17.87 kg/m   Physical Exam General: Alert and oriented x 3, NAD, frail and ill-appearing Cardiovascular: S1 S2 clear, RRR.  Respiratory: Diminished breath sound at the bases, no wheezing Gastrointestinal: Soft, nontender, nondistended, NBS Ext: no pedal edema bilaterally Neuro: no new deficits Psych: Normal affect    Condition at discharge: fair  The results of significant diagnostics from this hospitalization (including imaging, microbiology, ancillary and laboratory) are listed below for reference.   Imaging Studies: DG CHEST PORT 1 VIEW Result Date: 12/09/2023 CLINICAL DATA:  Shortness of breath EXAM: PORTABLE CHEST 1 VIEW COMPARISON:  Film from earlier in the same day. FINDINGS: Cardiac shadow is stable. Pacing device is again seen. Aortic calcifications are noted. The lungs are well aerated bilaterally. Increasing airspace opacity in the right base is noted consistent with developing infiltrate. IMPRESSION: Increasing right basilar infiltrate. Electronically Signed   By: Oneil Devonshire M.D.   On: 12/09/2023 21:05   DG CHEST PORT 1 VIEW Result Date: 12/09/2023 CLINICAL DATA:  Dyspnea EXAM: PORTABLE CHEST 1 VIEW COMPARISON:  12/05/2023 FINDINGS: Dual lead pacer noted. Atherosclerotic calcification of the aortic arch. The patient is rotated to the right on today's radiograph, reducing diagnostic sensitivity and specificity. Substantial degenerative  glenohumeral arthropathy bilaterally. Upper normal cardiac size. Improved aeration at the right lung base. Equivocal blunting of the right lateral costophrenic angle, cannot exclude trace right pleural effusion. IMPRESSION: 1. Improved aeration at the right lung base. 2. Equivocal blunting of the right lateral costophrenic angle, cannot exclude trace right pleural effusion. 3. Substantial degenerative glenohumeral arthropathy bilaterally. 4. Aortic Atherosclerosis (ICD10-I70.0). Electronically Signed   By: Ryan Salvage M.D.   On: 12/09/2023 12:54   DG Chest 1 View Result Date: 12/05/2023  CLINICAL DATA:  Short of breath EXAM: CHEST  1 VIEW COMPARISON:  12/04/2023 FINDINGS: 2 frontal views of the chest demonstrates stable dual lead pacemaker. The cardiac silhouette remains enlarged. Continued ectasia and atherosclerosis of the thoracic aorta. Stable veiling opacity at the right lung base consistent with consolidation and/or effusion. Persistent vascular congestion. No pneumothorax. No acute bony abnormalities. IMPRESSION: 1. Stable pulmonary vascular congestion and right basilar consolidation and/or effusion. 2. Stable enlarged cardiac silhouette. Electronically Signed   By: Ozell Daring M.D.   On: 12/05/2023 17:26   DG Chest 1 View Result Date: 12/04/2023 CLINICAL DATA:  Shortness of breath EXAM: CHEST  1 VIEW COMPARISON:  12/01/2023 FINDINGS: Cardiomegaly. Pacer wires in the right heart, unchanged. Aortic atherosclerosis. Focal airspace opacity in the right lower lobe, increasing since prior study. Minimal left basilar opacity likely reflects atelectasis. No visible effusions. No acute bony abnormality. IMPRESSION: Cardiomegaly. Worsening right lower lobe airspace opacity concerning for pneumonia. Left base atelectasis. Electronically Signed   By: Franky Crease M.D.   On: 12/04/2023 17:41   DG Swallowing Func-Speech Pathology Result Date: 12/04/2023 Table formatting from the original result was not  included. Modified Barium Swallow Study Patient Details Name: TRAVON CROCHET MRN: [id number] Date of Birth: October 04, 1926 Today's Date: 12/04/2023 HPI/PMH: HPI: Patient is a 89 y.o. male with PMH: a-fib, HTN, asthma, pacemaker, pulmonary hypertension, severe CAD with multiple blockages per cath 07/2023. He presented to the hospital on 12/02/23 for SOB. CT showed bilateral pleural effusion and pulmonary nodule. SLP swallow evaluation ordered secondary to coughing with PO intake. Clinical Impression: Pt presents with a mild oral dysphagia, a moderate pharyngeal dysphagia, and concerns for an esophageal dysphagia (?primary source of dysphagia) per MBSS completed today. There was aspiration of thin and nectar-thick liquids. Improved airway protection observed with chin tuck with thin liquids. Findings: -There was scant, silent aspiration of thin liquids by spoon during/after the swallow and  frank, audible aspiration of thin liquids by straw before/during the swallow.  Pt with head in neutral position for both trials. -Chin tuck was effective for aiding airway protection completely or reducing airway violation to scant, transient or stagnant shallow penetration of thin liquids by straw. -There was scant, silent aspiration of nectar-thick liquid wash (post cracker trial) after the swallow. Pt's head was in neutral position. -No penetration or aspiration of nectar-thick liquids by cup. -Shallow penetration without ejection of thin liquids by cup (head in neutral) observed. -There was min-mod diffuse pharyngeal residue following thin, nectar-thick liquids and mod-large oropharyngeal residue following puree and cracker trials. Residue reduced with cued additional swallows x1-2. Oral deficits characterized by reduced oral strength and lingual propulsion resulting in min-moderate oral residue of pudding and solids post initial swallow. Residue eventually reduced with additional swallows. Pharyngeal deficits characterized by reduced  hyolaryngeal elevation/excursion, reduced base of tongue retraction, absent epiglottic inversion, reduced laryngeal vestibule closure, and reduced pharyngeal stripping. Concerns for esophageal dysphagia included moderate rentention of pudding and solid trials in the mid-lower esophagus with some retrograde flow below the UES. Pudding residue did clear with a liquid wash; however, the cracker residue remained despite multiple liquid washes. Due to esophageal retention of solids, discussed diet modification to mechanical ground/altered with pt and wife. Detailed diet recommendations outlined below. Please see follow up treatment note for further information pertaining to education and diet modification after discussion with pt and wife. Factors that may increase risk of adverse event in presence of aspiration Noe & Lianne 2021): Factors that may increase risk  of adverse event in presence of aspiration Noe & Lianne 2021): Frail or deconditioned; Weak cough Recommendations/Plan: Swallowing Evaluation Recommendations Swallowing Evaluation Recommendations Recommendations: PO diet PO Diet Recommendation: Dysphagia 2 (Finely chopped); Thin liquids (Level 0) Liquid Administration via: Straw (with chin tuck!) Medication Administration: Whole meds with puree Supervision: Full supervision/cueing for swallowing strategies Swallowing strategies  : Minimize environmental distractions; Slow rate; Small bites/sips; Follow solids with liquids; Multiple dry swallows after each bite/sip; Chin tuck Postural changes: Position pt fully upright for meals; Stay upright 30-60 min after meals Oral care recommendations: Oral care BID (2x/day) Recommended consults: Consider esophageal assessment Treatment Plan Treatment Plan Treatment recommendations: Therapy as outlined in treatment plan below Follow-up recommendations: Skilled nursing-short term rehab (<3 hours/day) Functional status assessment: Patient has had a recent decline in  their functional status and demonstrates the ability to make significant improvements in function in a reasonable and predictable amount of time. Treatment frequency: Min 1x/week Treatment duration: 2 weeks Interventions: Aspiration precaution training; Compensatory techniques; Patient/family education; Diet toleration management by SLP Recommendations Recommendations for follow up therapy are one component of a multi-disciplinary discharge planning process, led by the attending physician.  Recommendations may be updated based on patient status, additional functional criteria and insurance authorization. Assessment: Orofacial Exam: Orofacial Exam Oral Cavity: Oral Hygiene: WFL Oral Cavity - Dentition: Dentures, top (partial) Orofacial Anatomy: WFL Oral Motor/Sensory Function: WFL Anatomy: Anatomy: Prominent cricopharyngeus (appearance of a CP bar below UES - limited view due to shoulder position, though did not appear to impact bolus flow) Boluses Administered: Boluses Administered Boluses Administered: Thin liquids (Level 0); Mildly thick liquids (Level 2, nectar thick); Puree; Solid  Oral Impairment Domain: Oral Impairment Domain Lip Closure: No labial escape Tongue control during bolus hold: Cohesive bolus between tongue to palatal seal Bolus preparation/mastication: Slow prolonged chewing/mashing with complete recollection Bolus transport/lingual motion: Brisk tongue motion Oral residue: Residue collection on oral structures (with puree and cracker) Location of oral residue : Tongue; Palate Initiation of pharyngeal swallow : Valleculae (WNL for pt's age)  Pharyngeal Impairment Domain: Pharyngeal Impairment Domain Soft palate elevation: No bolus between soft palate (SP)/pharyngeal wall (PW) Laryngeal elevation: Partial superior movement of thyroid cartilage/partial approximation of arytenoids to epiglottic petiole Anterior hyoid excursion: Partial anterior movement Epiglottic movement: No inversion Laryngeal  vestibule closure: Incomplete, narrow column air/contrast in laryngeal vestibule Pharyngeal stripping wave : Present - diminished Pharyngeal contraction (A/P view only): N/A Pharyngoesophageal segment opening: Partial distention/partial duration, partial obstruction of flow Tongue base retraction: Narrow column of contrast or air between tongue base and PPW Pharyngeal residue: Collection of residue within or on pharyngeal structures  Esophageal Impairment Domain: Esophageal Impairment Domain Esophageal clearance upright position: Esophageal retention with retrograde flow below pharyngoesophageal segment (PES) Pill: No data recorded Penetration/Aspiration Scale Score: Penetration/Aspiration Scale Score 1.  Material does not enter airway: Thin liquids (Level 0); Mildly thick liquids (Level 2, nectar thick) 2.  Material enters airway, remains ABOVE vocal cords then ejected out: Thin liquids (Level 0); Mildly thick liquids (Level 2, nectar thick) 3.  Material enters airway, remains ABOVE vocal cords and not ejected out: Thin liquids (Level 0); Mildly thick liquids (Level 2, nectar thick); Puree; Solid 6.  Material enters airway, passes BELOW cords then ejected out: Mildly thick liquids (Level 2, nectar thick) 7.  Material enters airway, passes BELOW cords and not ejected out despite cough attempt by patient: Thin liquids (Level 0) 8.  Material enters airway, passes BELOW cords without attempt by patient to eject out (  silent aspiration) : Thin liquids (Level 0); Mildly thick liquids (Level 2, nectar thick) Compensatory Strategies: Compensatory Strategies Compensatory strategies: Yes Straw: Effective (only with chin tuck; worsened airway protection without using chin tuck strategy) Effective Straw: Thin liquid (Level 0) Multiple swallows: Effective (reduced pharygneal residue) Effective Multiple Swallows: Thin liquid (Level 0); Puree; Solid; Mildly thick liquid (Level 2, nectar thick) Chin tuck: Effective Effective Chin  Tuck: Thin liquid (Level 0)   General Information: Caregiver present: Yes (at bedside following study)  Diet Prior to this Study: Regular; Thin liquids (Level 0)   Temperature : Normal   Respiratory Status: Increased WOB   Supplemental O2: Nasal cannula (1L)   History of Recent Intubation: No  Behavior/Cognition: Alert; Cooperative Self-Feeding Abilities: Able to self-feed Baseline vocal quality/speech: Dysphonic Volitional Cough: Able to elicit Volitional Swallow: Able to elicit (delayed) Exam Limitations: Limited visibility (shoulder position) Goal Planning: Prognosis for improved oropharyngeal function: Fair Barriers to Reach Goals: Overall medical prognosis No data recorded Patient/Family Stated Goal: safe PO intake Consulted and agree with results and recommendations: Patient; Family member/caregiver; Nurse Pain: Pain Assessment Pain Assessment: No/denies pain End of Session: Start Time:SLP Start Time (ACUTE ONLY): 1030 Stop Time: SLP Stop Time (ACUTE ONLY): 1100 Time Calculation:SLP Time Calculation (min) (ACUTE ONLY): 30 min Charges: SLP Evaluations $ SLP Speech Visit: 1 Visit SLP Evaluations $MBS Swallow: 1 Procedure SLP visit diagnosis: SLP Visit Diagnosis: Dysphagia, oropharyngeal phase (R13.12) Past Medical History: Past Medical History: Diagnosis Date  Asthma   Hypertension  Past Surgical History: Past Surgical History: Procedure Laterality Date  CATARACT EXTRACTION Left 2007  double hernia repair  2010  HERNIA REPAIR  1984  KNEE SURGERY Left 1992  PACEMAKER IMPLANT N/A 08/30/2023  Procedure: PACEMAKER IMPLANT;  Surgeon: Kennyth Chew, MD;  Location: [facility] INVASIVE CV LAB;  Service: Cardiovascular;  Laterality: N/A;  RIGHT/LEFT HEART CATH AND CORONARY ANGIOGRAPHY N/A 08/10/2023  Procedure: RIGHT/LEFT HEART CATH AND CORONARY ANGIOGRAPHY;  Surgeon: [place], Peter M, MD;  Location: [facility] INVASIVE CV LAB;  Service: Cardiovascular;  Laterality: N/A;  ROTATOR CUFF REPAIR Left 2001  ruptured blood vessel L eye Left 2008  Peyton JINNY Rummer 12/04/2023, 12:47 PM  CT Chest Wo Contrast Result Date: 12/01/2023 CLINICAL DATA:  Short of breath EXAM: CT CHEST WITHOUT CONTRAST TECHNIQUE: Multidetector CT imaging of the chest was performed following the standard protocol without IV contrast. RADIATION DOSE REDUCTION: This exam was performed according to the departmental dose-optimization program which includes automated exposure control, adjustment of the mA and/or kV according to patient size and/or use of iterative reconstruction technique. COMPARISON:  12/01/2023, 07/31/2023, 06/03/2003 FINDINGS: Cardiovascular: Unenhanced imaging of the heart demonstrates cardiomegaly without pericardial effusion. Dual lead cardiac pacer is identified, proximal lead within the right atrium and distal lead within the right ventricle. Normal caliber of the thoracic aorta. Atherosclerosis of the aorta and coronary vasculature. Assessment of the vascular lumen cannot be performed without intravenous contrast. Mediastinum/Nodes: No significant abnormalities of the thyroid, trachea, or esophagus. No pathologic adenopathy. Lungs/Pleura: There are small free-flowing bilateral pleural effusions, right greater than left. Dependent areas of consolidation within the lower lobes most consistent with atelectasis. No pneumothorax. Stable 7 mm right upper lobe Peri fissural subpleural nodule, reference image 97/3. Upper Abdomen: No acute upper abdominal findings. Musculoskeletal: No acute or destructive bony abnormalities. Severe bilateral shoulder osteoarthritis. Reconstructed images demonstrate no additional findings. IMPRESSION: 1. Small free-flowing bilateral pleural effusions with dependent lower lobe atelectasis. 2. Stable 7 mm right upper lobe perifissural subpleural pulmonary nodule. As  discussed on prior CT exam, minimal change since 06/03/2003 exam suggest benignity, and confirmation with a follow-up CT May 2026 recommended. 3. Mild cardiomegaly. 4. Aortic  Atherosclerosis (ICD10-I70.0). Coronary artery atherosclerosis Electronically Signed   By: Ozell Daring M.D.   On: 12/01/2023 15:33   DG Chest Port 1 View Result Date: 12/01/2023 CLINICAL DATA:  Shortness of breath. EXAM: PORTABLE CHEST 1 VIEW COMPARISON:  August 30, 2023 FINDINGS: There is stable dual lead AICD positioning. The cardiac silhouette is mildly enlarged. Mild, diffuse, chronic appearing increased interstitial lung markings are seen. Mild areas of atelectasis and/or infiltrate are noted within the bilateral lung bases. No pleural effusion or pneumothorax is identified. Multilevel degenerative changes are seen throughout the thoracic spine. IMPRESSION: Mild bibasilar atelectasis and/or infiltrate. Electronically Signed   By: Suzen Dials M.D.   On: 12/01/2023 13:39   CUP PACEART INCLINIC DEVICE CHECK Result Date: 12/01/2023 Normal in-clinic dual chamber pacemaker check. Presenting Rhythm: AS-VP (AF). Routine testing of thresholds, sensing, and impedance demonstrate stable parameters and no programming changes needed at this time. Unable to check atrial threshold with AF. On-going AF x ~ 1 month. Estimated longevity 33yr51mo. Pt enrolled in remote follow-up.   Microbiology: Results for orders placed or performed during the hospital encounter of 12/01/23  Urine Culture     Status: Abnormal   Collection Time: 12/08/23 11:29 AM   Specimen: Urine, Random  Result Value Ref Range Status   Specimen Description URINE, RANDOM  Final   Special Requests   Final    NONE Reflexed from [id number] Performed at [facility], [address], [place], [place] [address]    Culture >=100,000 COLONIES/mL ENTEROCOCCUS FAECALIS (A)  Final   Report Status 12/10/2023 FINAL  Final   Organism ID, Bacteria ENTEROCOCCUS FAECALIS (A)  Final      Susceptibility   Enterococcus faecalis - MIC*    AMPICILLIN  <=2 SENSITIVE Sensitive     NITROFURANTOIN <=16 SENSITIVE Sensitive     VANCOMYCIN 1 SENSITIVE Sensitive      * >=100,000 COLONIES/mL ENTEROCOCCUS FAECALIS    Labs: CBC: Recent Labs  Lab 12/06/23 0304 12/09/23 0659 12/11/23 0909  WBC 9.0 14.7* 12.9*  HGB 12.3* 12.3* 13.0  HCT 38.0* 37.7* 40.4  MCV 97.2 97.4 98.1  PLT 181 177 192   Basic Metabolic Panel: Recent Labs  Lab 12/06/23 0304 12/08/23 0927 12/10/23 0756 12/12/23 0955  NA 139 139 141 139  K 4.5 4.8 4.7 4.6  CL 102 107 104 103  CO2 28 25 26 28   GLUCOSE 130* 141* 101* 134*  BUN 41* 41* 40* 34*  CREATININE 1.08 0.88 0.81 0.99  CALCIUM  8.1* 8.3* 8.4* 8.4*  MG 2.4  --   --   --    Liver Function Tests: No results for input(s): AST, ALT, ALKPHOS, BILITOT, PROT, ALBUMIN in the last 168 hours. CBG: No results for input(s): GLUCAP in the last 168 hours.  Discharge time spent: greater than 30 minutes.  Signed: Nydia Distance, MD [facility] 12/12/2023

## 2023-12-12 NOTE — Assessment & Plan Note (Signed)
 Chronic stable

## 2023-12-12 NOTE — Assessment & Plan Note (Signed)
 Continue Lipitor 20 mg a day.

## 2023-12-12 NOTE — Assessment & Plan Note (Signed)
 Still needs frequent nebulizers also noted that patient seems to have intermittent increase in respiratory drive in the setting of feeling anxious he is able to be redirected which is somewhat helpful.  Patient states that he has frequent episodes like this. Continue management for COPD exacerbation for now

## 2023-12-12 NOTE — ED Triage Notes (Signed)
 Patient arrives via [facility] from [place] for [facility]. Recently dc. Patient respirations 40/min, 80s on 2.5 liters [place]. Placed on NRB. Patient endorses feeling better. Leg tremors are at baseline. Ventricular pacemaker. Hx of CHF with poor EF. Fine crackles in lowers, uppers clear. Responding appropriately to orientation questions.   EMS vitals 127/77 97.4 HR 70s

## 2023-12-12 NOTE — Assessment & Plan Note (Signed)
 Continue Lipitor 20 mg a day When blood pressure improves will restart metoprolol 

## 2023-12-12 NOTE — Assessment & Plan Note (Signed)
 On dysphagia 2 diet with nectar thick fluids

## 2023-12-12 NOTE — Assessment & Plan Note (Addendum)
 Continue to maintain Foley catheter History of Enterococcus UTI continue antibiotics

## 2023-12-13 ENCOUNTER — Other Ambulatory Visit: Payer: Self-pay

## 2023-12-13 DIAGNOSIS — I10 Essential (primary) hypertension: Secondary | ICD-10-CM | POA: Diagnosis not present

## 2023-12-13 DIAGNOSIS — I2510 Atherosclerotic heart disease of native coronary artery without angina pectoris: Secondary | ICD-10-CM | POA: Diagnosis not present

## 2023-12-13 DIAGNOSIS — J441 Chronic obstructive pulmonary disease with (acute) exacerbation: Secondary | ICD-10-CM | POA: Diagnosis not present

## 2023-12-13 DIAGNOSIS — I5023 Acute on chronic systolic (congestive) heart failure: Secondary | ICD-10-CM | POA: Diagnosis not present

## 2023-12-13 LAB — MAGNESIUM: Magnesium: 2 mg/dL (ref 1.7–2.4)

## 2023-12-13 LAB — COMPREHENSIVE METABOLIC PANEL WITH GFR
ALT: 82 U/L — ABNORMAL HIGH (ref 0–44)
AST: 43 U/L — ABNORMAL HIGH (ref 15–41)
Albumin: 2.6 g/dL — ABNORMAL LOW (ref 3.5–5.0)
Alkaline Phosphatase: 62 U/L (ref 38–126)
Anion gap: 8 (ref 5–15)
BUN: 41 mg/dL — ABNORMAL HIGH (ref 8–23)
CO2: 25 mmol/L (ref 22–32)
Calcium: 8 mg/dL — ABNORMAL LOW (ref 8.9–10.3)
Chloride: 102 mmol/L (ref 98–111)
Creatinine, Ser: 1.08 mg/dL (ref 0.61–1.24)
GFR, Estimated: >60 mL/min (ref >60)
Glucose, Bld: 154 mg/dL — ABNORMAL HIGH (ref 70–99)
Potassium: 4.4 mmol/L (ref 3.5–5.1)
Sodium: 135 mmol/L (ref 135–145)
Total Bilirubin: 0.9 mg/dL (ref 0.0–1.2)
Total Protein: 5 g/dL — ABNORMAL LOW (ref 6.5–8.1)

## 2023-12-13 LAB — LACTIC ACID, PLASMA: Lactic Acid, Venous: 2.6 mmol/L (ref 0.5–1.9)

## 2023-12-13 LAB — CBC
HCT: 37.4 % — ABNORMAL LOW (ref 39.0–52.0)
Hemoglobin: 12.2 g/dL — ABNORMAL LOW (ref 13.0–17.0)
MCH: 31.4 pg (ref 26.0–34.0)
MCHC: 32.6 g/dL (ref 30.0–36.0)
MCV: 96.4 fL (ref 80.0–100.0)
Platelets: 173 K/uL (ref 150–400)
RBC: 3.88 MIL/uL — ABNORMAL LOW (ref 4.22–5.81)
RDW: 13.2 % (ref 11.5–15.5)
WBC: 12.1 K/uL — ABNORMAL HIGH (ref 4.0–10.5)
nRBC: 0 % (ref 0.0–0.2)

## 2023-12-13 LAB — PHOSPHORUS: Phosphorus: 5.7 mg/dL — ABNORMAL HIGH (ref 2.5–4.6)

## 2023-12-13 MED ORDER — DICLOFENAC SODIUM 1 % EX GEL
2.0000 g | Freq: Four times a day (QID) | CUTANEOUS | Status: DC
  Administered 2023-12-13 – 2023-12-18 (×19): 2 g via TOPICAL
  Filled 2023-12-13: qty 100

## 2023-12-13 MED ORDER — CHLORHEXIDINE GLUCONATE CLOTH 2 % EX PADS
6.0000 | MEDICATED_PAD | Freq: Every day | CUTANEOUS | Status: DC
Start: 2023-12-13 — End: 2023-12-18
  Administered 2023-12-13 – 2023-12-18 (×6): 6 via TOPICAL

## 2023-12-13 NOTE — Progress Notes (Signed)
  Progress Note   Patient: Guy Espinoza FMW:[id number] DOB: February 09, 1927 DOA: 12/12/2023     1 DOS: the patient was seen and examined on 12/13/2023   Brief hospital course: Guy Espinoza was admitted to the hospital with the working diagnosis of COPD and heart failure exacerbation.   89 yo male with past medical history of heart failure, COPD, atrial fibrillation, coronary artery disease, dyslipidemia and protein calorie malnutrition who presented with dyspnea.  Recent hospitalization 09/05 to 12/12/23 for heart failure and COPD exacerbation. He had 12,6 L negative fluid balance, ad discharged with supplemental 02 per [place].  At arrival to SNF he was in respiratory distress with 40 breaths per minute and 02 saturation 80% on 2,5 L/min per [place], patient was placed on non re breather and transported back to the hospital  On his initial physical examination 109/63, HR 70 RR 26 and 02 saturation 100% on supplemental 02  Lungs with wheezing and rales, heart with S1 and S2 present and regular, abdomen with no distention and no lower extremity edema.   Assessment and Plan: Acute on chronic systolic CHF (congestive heart failure) (HCC) Echocardiogram with reduced LV systolic function EF 35 to 40%, with global hypokinesis, grade II diastolic dysfunction pseudo normalization EA, RV systolic function preserved, RVSP 72,8 mmHg, severe dilatation RA and LA, moderate TR   Patient today with improved volume status Hold on furosemide .  Plan to continue metoprolol  succinate.   Essential hypertension Continue blood pressure control with metoprolol .   Coronary artery disease involving native coronary artery of native heart No chest pain, no acute coronary syndrome.   Atrial fibrillation, chronic (HCC) Continue rate control with metoprolol , and anticoagulation with apixaban .   Spinal stenosis Continue ain control, PT and OT   COPD with acute exacerbation (HCC) Acute on chronic hypoxemic respiratory  failure.  Continue bronchodilator therapy.  He had 125 mg methylprednisolone  yesterday.  Continue prednisone  40 mg po daily.  Continue 02 monitoring and supplemental 02 per [place]   Pure hypercholesterolemia Continue statin therapy   Dysphagia Continue aspiration precautions.   Urinary retention Continue with tamsulosin .  Foley catheter in place.          Subjective: Patient continue to have dyspnea, it has improved, but not yet back to baseline, positive right foot great toe pain   Physical Exam: Vitals:   12/13/23 1130 12/13/23 1300 12/13/23 1452 12/13/23 1522  BP: 109/68 99/66 120/80 116/72  Pulse: 70 73 71   Resp: 17 17 (!) 23   Temp:   97.8 F (36.6 C) (!) 97.5 F (36.4 C)  TempSrc:   Oral Oral  SpO2: 100% 99% 100% 100%  Weight:    52.6 kg  Height:    5' 9 (1.753 m)   Neurology awake and alert, deconditioned and ill looking appearing  ENT with mild pallor Cardiovascular with S1 and S2 present and regular with no gallops, rubs or murmurs Respiratory with bilateral rales with no wheezing, poor inspiratory effort and prolonged expiratory phase Abdomen with no distention  No lower extremity edema  Data Reviewed:    Family Communication: I spoke with patient's wife at the bedside, we talked in detail about patient's condition, plan of care and prognosis and all questions were addressed.   Disposition: Status is: Inpatient Remains inpatient appropriate because: recovering respiratory failure   Planned Discharge Destination: Skilled nursing facility      Author: Elidia Toribio Furnace, MD 12/13/2023 4:53 PM  For on call review [email or web address].

## 2023-12-13 NOTE — Assessment & Plan Note (Addendum)
 Acute on chronic hypoxemic respiratory failure.  Patient was placed on bronchodilator therapy and supplemental 02 per  and used intermittently bipap for increased work of breathing.   Patient very weak and deconditioned very poor respiratory reserve.   Patient was placed on antibiotic therapy for aspiration Pneumonia (present on admission) Ceftriaxone  IV  with only transitory improvement in his dyspnea.  Plan to continue with aspiration precautions. I suspect recurrent aspiration events.  Very poor prognosis with worsening respiratory status. Transition to residential hospice.

## 2023-12-13 NOTE — Assessment & Plan Note (Addendum)
 Patient very weak and deconditioned

## 2023-12-13 NOTE — Assessment & Plan Note (Addendum)
 Echocardiogram with reduced LV systolic function EF 35 to 40%, with global hypokinesis, grade II diastolic dysfunction pseudo normalization EA, RV systolic function preserved, RVSP 72,8 mmHg, severe dilatation RA and LA, moderate TR   Continue to hold on furosemide  Limited medical therapy due to patient's frailty  Poor prognosis and transitioned to full comfort care.

## 2023-12-13 NOTE — Assessment & Plan Note (Signed)
 Continue aspiration precautions

## 2023-12-13 NOTE — Hospital Course (Addendum)
 Guy Espinoza was admitted to the hospital with the working diagnosis of COPD, aspiration pneumonia and heart failure exacerbation.   89 yo male with past medical history of heart failure, COPD, atrial fibrillation, coronary artery disease, dyslipidemia and protein calorie malnutrition who presented with dyspnea.  Recent hospitalization 09/05 to 12/12/23 for heart failure and COPD exacerbation. He had 12,6 L negative fluid balance, ad discharged with supplemental 02 per [place].  At arrival to SNF he was in respiratory distress with 40 breaths per minute and 02 saturation 80% on 2,5 L/min per [place], patient was placed on non re breather and transported back to the hospital  On his initial physical examination 109/63, HR 70 RR 26 and 02 saturation 100% on supplemental 02  Lungs with wheezing and rales, heart with S1 and S2 present and regular, abdomen with no distention and no lower extremity edema.   Na 139, K 4.6 Cl 0103 bicarbonate 28 glucose 134 bun 34 cr 0,99  AST 55 ALT 95  BNP 418  High sensitive troponin 14 and 15  Procalcitonin < 0.10 Wbc 14.1 hgb 12.5 plt 187   Chest radiograph with hyperinflation with positive cardiomegaly, pacemaker in place with right atrium and right ventricle leads.   EKG 70 bpm, left axis deviation, left bundle branch block, qtc 517, ventricular paced rhythm with underlying atrial flutter, with no significant ST segment or T waves changes.   09/19 continue very weak and deconditioned.  Started on antibiotic therapy for possible aspiration pneumonia.  09/20 patient with more stable oxygenation and symptoms, plan for SNF with palliative care and transition to hospice if further decompensation. Discuss with his wife at the bedside.  09/21 last night had worsening dyspnea and required non invasive mechanical ventilation. Chest radiograph with possible retro cardiac infiltrate.  09/22 patient continues to deteriorate, he and his wife decided to transfer to hospice.

## 2023-12-13 NOTE — Assessment & Plan Note (Signed)
No chest pain, no acute coronary syndrome.  

## 2023-12-13 NOTE — ED Notes (Signed)
 ED pharmacist requested main pharmacy to resend pts ampicillin  as it is nowhere to be found in ED tube stations

## 2023-12-13 NOTE — Assessment & Plan Note (Addendum)
 Continue with tamsulosin .  Continue with Foley catheter in place, comfort care.

## 2023-12-13 NOTE — Assessment & Plan Note (Addendum)
 Paced rhythm, plan to transition to comfort care.

## 2023-12-13 NOTE — Plan of Care (Signed)

## 2023-12-13 NOTE — ED Notes (Signed)
 CCMD called to report transfer to [phone]

## 2023-12-13 NOTE — Assessment & Plan Note (Addendum)
 Transitioned to comfort care

## 2023-12-14 DIAGNOSIS — I2510 Atherosclerotic heart disease of native coronary artery without angina pectoris: Secondary | ICD-10-CM | POA: Diagnosis not present

## 2023-12-14 DIAGNOSIS — I10 Essential (primary) hypertension: Secondary | ICD-10-CM | POA: Diagnosis not present

## 2023-12-14 DIAGNOSIS — J441 Chronic obstructive pulmonary disease with (acute) exacerbation: Secondary | ICD-10-CM | POA: Diagnosis not present

## 2023-12-14 DIAGNOSIS — I5023 Acute on chronic systolic (congestive) heart failure: Secondary | ICD-10-CM | POA: Diagnosis not present

## 2023-12-14 LAB — CBC
HCT: 36.6 % — ABNORMAL LOW (ref 39.0–52.0)
Hemoglobin: 12 g/dL — ABNORMAL LOW (ref 13.0–17.0)
MCH: 31.2 pg (ref 26.0–34.0)
MCHC: 32.8 g/dL (ref 30.0–36.0)
MCV: 95.1 fL (ref 80.0–100.0)
Platelets: 189 K/uL (ref 150–400)
RBC: 3.85 MIL/uL — ABNORMAL LOW (ref 4.22–5.81)
RDW: 13.2 % (ref 11.5–15.5)
WBC: 15.6 K/uL — ABNORMAL HIGH (ref 4.0–10.5)
nRBC: 0 % (ref 0.0–0.2)

## 2023-12-14 LAB — BASIC METABOLIC PANEL WITH GFR
Anion gap: 12 (ref 5–15)
BUN: 44 mg/dL — ABNORMAL HIGH (ref 8–23)
CO2: 24 mmol/L (ref 22–32)
Calcium: 8.2 mg/dL — ABNORMAL LOW (ref 8.9–10.3)
Chloride: 100 mmol/L (ref 98–111)
Creatinine, Ser: 0.98 mg/dL (ref 0.61–1.24)
GFR, Estimated: >60 mL/min (ref >60)
Glucose, Bld: 137 mg/dL — ABNORMAL HIGH (ref 70–99)
Potassium: 5.1 mmol/L (ref 3.5–5.1)
Sodium: 136 mmol/L (ref 135–145)

## 2023-12-14 LAB — MAGNESIUM: Magnesium: 2 mg/dL (ref 1.7–2.4)

## 2023-12-14 MED ORDER — SODIUM CHLORIDE 0.9 % IV SOLN
1.0000 g | INTRAVENOUS | Status: DC
  Administered 2023-12-14 – 2023-12-17 (×4): 1 g via INTRAVENOUS
  Filled 2023-12-14 (×4): qty 10

## 2023-12-14 MED ORDER — CYCLOBENZAPRINE HCL 5 MG PO TABS
5.0000 mg | ORAL_TABLET | Freq: Three times a day (TID) | ORAL | Status: DC | PRN
  Administered 2023-12-15: 5 mg via ORAL
  Filled 2023-12-14: qty 1

## 2023-12-14 NOTE — Progress Notes (Addendum)
 Progress Note   Patient: Guy Espinoza FMW:[id number] DOB: [birth year] DOA: 12/12/2023     2 DOS: the patient was seen and examined on 12/14/2023   Brief hospital course: Guy Espinoza was admitted to the hospital with the working diagnosis of COPD and heart failure exacerbation.   Guy Espinoza with past medical history of heart failure, COPD, atrial fibrillation, coronary artery disease, dyslipidemia and protein calorie malnutrition who presented with dyspnea.  Recent hospitalization 09/05 to 12/12/23 for heart failure and COPD exacerbation. He had 12,6 L negative fluid balance, ad discharged with supplemental 02 per [place].  At arrival to SNF he was in respiratory distress with 40 breaths per minute and 02 saturation 80% on 2,5 L/min per [place], patient was placed on non re breather and transported back to the hospital  On his initial physical examination 109/63, HR 70 RR 26 and 02 saturation 100% on supplemental 02  Lungs with wheezing and rales, heart with S1 and S2 present and regular, abdomen with no distention and no lower extremity edema.   Chest radiograph with hyperinflation with positive cardiomegaly, pacemaker in place with right atrium and right ventricle leads.   Assessment and Plan: Acute on chronic systolic CHF (congestive heart failure) (HCC) Echocardiogram with reduced LV systolic function EF 35 to 40%, with global hypokinesis, grade II diastolic dysfunction pseudo normalization EA, RV systolic function preserved, RVSP 72,8 mmHg, severe dilatation RA and LA, moderate TR   Continue to hold on furosemide , today with no signs of volume overload.  Plan to continue metoprolol  succinate.   Essential hypertension Continue blood pressure control with metoprolol .   Coronary artery disease involving native coronary artery of native heart No chest pain, no acute coronary syndrome.   Atrial fibrillation, chronic (HCC) Continue rate control with metoprolol , and anticoagulation with apixaban .    Spinal stenosis Continue ain control, PT and OT   COPD with acute exacerbation (HCC) Acute on chronic hypoxemic respiratory failure.  Continue bronchodilator therapy.  Continue prednisone  40 mg po daily.  Continue 02 monitoring and supplemental 02 per [place]  Patient very weak and deconditioned very poor respiratory reserve.  02 saturation is 96% on 2 L/min per [place]   Positive leukocytosis, can not rule out pneumonia, with recurrent aspiration, will add IV antibiotic therapy with Ceftriaxone    Pure hypercholesterolemia Continue statin therapy   Dysphagia Continue aspiration precautions.   Urinary retention Continue with tamsulosin .  Foley catheter in place.          Subjective: Patient continue very weak and deconditioned, positive dyspnea and cough   Physical Exam: Vitals:   12/14/23 0446 12/14/23 0740 12/14/23 1102 12/14/23 1526  BP: 111/80 108/74 116/74 130/83  Pulse: 70 72 69 67  Resp: 17 20 (!) 24 20  Temp: 97.6 F (36.4 C) 97.7 F (36.5 C) (!) 97.4 F (36.3 C) 97.8 F (36.6 C)  TempSrc: Axillary Oral Oral Oral  SpO2: 99% 100% 97% 99%  Weight:      Height:       Neurology somnolent but easy to arouse, deconditioned and ill looking appearing ENT with mild pallor Cardiovascular with S1 and S2 present and regular with positive systolic murmur at the left lower sternal border No JVD Respiratory with prolonged expiratory phase with rales and rhonchi, no wheezing Abdomen with no distention  No lower extremity edema  Data Reviewed:    Family Communication: I spoke with patient's wife at the bedside, we talked in detail about patient's condition, plan of care and  prognosis and all questions were addressed.   Disposition: Status is: Inpatient Remains inpatient appropriate because: respiratory failure   Planned Discharge Destination: Skilled nursing facility     Author: Elidia Toribio Furnace, MD 12/14/2023 5:20 PM  For on call review  [email or web address].

## 2023-12-14 NOTE — Evaluation (Signed)
 Physical Therapy Evaluation Patient Details Name: Guy Espinoza MRN: [id number] DOB: 01/18/1927 Today's Date: 12/14/2023  History of Present Illness  Pt is 89 yo male who was just discharged from the ED on 9/16. Pt was in resp distress on arrival; placed on non-rebreather and sent back to hospital. PMH: HTN, asthma, pacemaker, afib, CAD, CHF, AV block  Clinical Impression  Pt is presenting at Min A for bed mobility, sit to stand and is limited by fatigue. O2 sats remained in the 90's throughout activity with increased dyspnea with mobility. Pt was fatigued at end of session. Pt and spouse would like for pt to remain as mobile as possible for as long as possible. Due to pt current functional status, home set up and available assistance at home recommending skilled physical therapy services 3x/week in order to address strength, balance and functional mobility to decrease risk for falls, injury and re-hospitalization.           If plan is discharge home, recommend the following: Help with stairs or ramp for entrance;Assist for transportation;A lot of help with walking and/or transfers;Supervision due to cognitive status;Assistance with cooking/housework     Equipment Recommendations None recommended by PT     Functional Status Assessment Patient has had a recent decline in their functional status and/or demonstrates limited ability to make significant improvements in function in a reasonable and predictable amount of time     Precautions / Restrictions Precautions Precautions: Fall Recall of Precautions/Restrictions: Impaired Precaution/Restrictions Comments: bowel incontinence Restrictions Weight Bearing Restrictions Per Provider Order: No      Mobility  Bed Mobility Overal bed mobility: Needs Assistance Bed Mobility: Rolling, Supine to Sit, Sit to Supine Rolling: Used rails, Min assist   Supine to sit: Used rails, HOB elevated, Min assist Sit to supine: Used rails, Min assist    General bed mobility comments: Increased time/effort to sit up to L EOB, Min A and assist with lines/bed pad, cues for improved body mechanics.    Transfers Overall transfer level: Needs assistance Equipment used: Rolling walker (2 wheels) Transfers: Sit to/from Stand Sit to Stand: Min assist           General transfer comment: Min A for sit to stand from EOB with verbal cues for safe hand placement.    Ambulation/Gait   Pre-gait activities: Pt performed standing marches and side stepping at EOB at [facility]. Increased resp with O2 sats remaining in the 90's.        Balance Overall balance assessment: Needs assistance Sitting-balance support: No upper extremity supported, Feet supported Sitting balance-Leahy Scale: Fair     Standing balance support: Bilateral upper extremity supported, During functional activity, Reliant on assistive device for balance Standing balance-Leahy Scale: Poor Standing balance comment: Reliant on BUE and external support       Pertinent Vitals/Pain Pain Assessment Pain Assessment: No/denies pain    Home Living Family/patient expects to be discharged to:: Hospice/Palliative care Living Arrangements: Spouse/significant other Available Help at Discharge: Family Type of Home: House Home Access: Stairs to enter Entrance Stairs-Rails: Doctor, general practice of Steps: 4   Home Layout: One level Home Equipment: Rollator (4 wheels);Cane - single point;Grab bars - tub/shower      Prior Function Prior Level of Function : Independent/Modified Independent             Mobility Comments: Mod I rollator, no falls in 6 months ADLs Comments: Mod I for basic ADLs     Extremity/Trunk Assessment   Upper  Extremity Assessment Upper Extremity Assessment: Generalized weakness    Lower Extremity Assessment Lower Extremity Assessment: Generalized weakness    Cervical / Trunk Assessment Cervical / Trunk Assessment: Kyphotic   Communication   Communication Communication: Impaired Factors Affecting Communication: Hearing impaired    Cognition Arousal: Alert Behavior During Therapy: Flat affect   PT - Cognitive impairments: Attention, Initiation, Sequencing, Problem solving, Safety/Judgement, Awareness   Orientation impairments: Time, Situation     PT - Cognition Comments: Pt confused that he has foley Following commands: Intact       Cueing Cueing Techniques: Verbal cues, Gestural cues, Tactile cues     General Comments General comments (skin integrity, edema, etc.): Pt with increased respirations in standing, O2 sats remained in 90's and HR WNL        Assessment/Plan    PT Assessment Patient needs continued PT services  PT Problem List Decreased strength;Decreased activity tolerance;Decreased balance;Decreased mobility;Cardiopulmonary status limiting activity       PT Treatment Interventions DME instruction;Balance training;Gait training;Stair training;Functional mobility training;Therapeutic activities;Therapeutic exercise;Patient/family education    PT Goals (Current goals can be found in the Care Plan section)  Acute Rehab PT Goals Patient Stated Goal: to stay as mobile as long as possible PT Goal Formulation: With patient/family Time For Goal Achievement: 12/28/23 Potential to Achieve Goals: Fair    Frequency Min 1X/week        AM-PAC PT 6 Clicks Mobility  Outcome Measure Help needed turning from your back to your side while in a flat bed without using bedrails?: A Little Help needed moving from lying on your back to sitting on the side of a flat bed without using bedrails?: A Little Help needed moving to and from a bed to a chair (including a wheelchair)?: A Little Help needed standing up from a chair using your arms (e.g., wheelchair or bedside chair)?: A Little Help needed to walk in hospital room?: Total Help needed climbing 3-5 steps with a railing? : Total 6 Click  Score: 14    End of Session Equipment Utilized During Treatment: Oxygen;Gait belt Activity Tolerance: Patient tolerated treatment well;Patient limited by fatigue Patient left: in bed;with call bell/phone within reach;with bed alarm set;with family/visitor present Nurse Communication: Mobility status PT Visit Diagnosis: Unsteadiness on feet (R26.81);Other abnormalities of gait and mobility (R26.89);Muscle weakness (generalized) (M62.81);Difficulty in walking, not elsewhere classified (R26.2)    Time: 8387-8369 PT Time Calculation (min) (ACUTE ONLY): 18 min   Charges:   PT Evaluation $PT Eval Low Complexity: 1 Low   PT General Charges $$ ACUTE PT VISIT: 1 Visit        Dorothyann Maier, DPT, CLT  Acute Rehabilitation Services Office: [phone] (Secure chat preferred)   Dorothyann VEAR Maier 12/14/2023, 4:53 PM

## 2023-12-14 NOTE — Plan of Care (Signed)

## 2023-12-15 DIAGNOSIS — I10 Essential (primary) hypertension: Secondary | ICD-10-CM | POA: Diagnosis not present

## 2023-12-15 DIAGNOSIS — I5023 Acute on chronic systolic (congestive) heart failure: Secondary | ICD-10-CM | POA: Diagnosis not present

## 2023-12-15 DIAGNOSIS — J441 Chronic obstructive pulmonary disease with (acute) exacerbation: Secondary | ICD-10-CM | POA: Diagnosis not present

## 2023-12-15 DIAGNOSIS — I2510 Atherosclerotic heart disease of native coronary artery without angina pectoris: Secondary | ICD-10-CM | POA: Diagnosis not present

## 2023-12-15 LAB — CBC
HCT: 34.8 % — ABNORMAL LOW (ref 39.0–52.0)
Hemoglobin: 11.5 g/dL — ABNORMAL LOW (ref 13.0–17.0)
MCH: 31.4 pg (ref 26.0–34.0)
MCHC: 33 g/dL (ref 30.0–36.0)
MCV: 95.1 fL (ref 80.0–100.0)
Platelets: 164 K/uL (ref 150–400)
RBC: 3.66 MIL/uL — ABNORMAL LOW (ref 4.22–5.81)
RDW: 13.2 % (ref 11.5–15.5)
WBC: 12 K/uL — ABNORMAL HIGH (ref 4.0–10.5)
nRBC: 0 % (ref 0.0–0.2)

## 2023-12-15 LAB — BASIC METABOLIC PANEL WITH GFR
Anion gap: 6 (ref 5–15)
BUN: 36 mg/dL — ABNORMAL HIGH (ref 8–23)
CO2: 24 mmol/L (ref 22–32)
Calcium: 7.8 mg/dL — ABNORMAL LOW (ref 8.9–10.3)
Chloride: 105 mmol/L (ref 98–111)
Creatinine, Ser: 0.81 mg/dL (ref 0.61–1.24)
GFR, Estimated: >60 mL/min (ref >60)
Glucose, Bld: 167 mg/dL — ABNORMAL HIGH (ref 70–99)
Potassium: 4.5 mmol/L (ref 3.5–5.1)
Sodium: 135 mmol/L (ref 135–145)

## 2023-12-15 NOTE — TOC Initial Note (Signed)
 Transition of Care [facility]) - Initial/Assessment Note    Patient Details  Name: Guy Espinoza MRN: [id number] Date of Birth: 1926/08/16  Transition of Care [facility]) CM/SW Contact:    Luise JAYSON Pan, LCSWA Phone Number: 12/15/2023, 2:32 PM  Clinical Narrative:    Patient recently discharged to [facility] for rehab. Patient only spent a few hours at facility before coming back to the hospital. Per PT, patient does not want to go back to SNF. Per PT, patient wants hospice. PT notified MD to place palliative consult.   ICM (inpatient care management) will continue to follow for discharge plans.    Expected Discharge Plan:  (TBD) Barriers to Discharge: Continued Medical Work up   Patient Goals and CMS Choice Patient states their goals for this hospitalization and ongoing recovery are:: to go home          Expected Discharge Plan and Services In-house Referral: Clinical Social Work Discharge Planning Services: CM Consult   Living arrangements for the past 2 months: Single Family Home                                      Prior Living Arrangements/Services Living arrangements for the past 2 months: Single Family Home Lives with:: Self, Spouse Patient language and need for interpreter reviewed:: No Do you feel safe going back to the place where you live?: Yes      Need for Family Participation in Patient Care: Yes (Comment) Care giver support system in place?: Yes (comment)   Criminal Activity/Legal Involvement Pertinent to Current Situation/Hospitalization: No - Comment as needed  Activities of Daily Living   ADL Screening (condition at time of admission) Independently performs ADLs?: No Does the patient have a NEW difficulty with bathing/dressing/toileting/self-feeding that is expected to last >3 days?: No Does the patient have a NEW difficulty with getting in/out of bed, walking, or climbing stairs that is expected to last >3 days?: No Does the patient have a NEW  difficulty with communication that is expected to last >3 days?: No Is the patient deaf or have difficulty hearing?: No Does the patient have difficulty seeing, even when wearing glasses/contacts?: No Does the patient have difficulty concentrating, remembering, or making decisions?: No  Permission Sought/Granted Permission sought to share information with : Family Supports Permission granted to share information with : Yes, Verbal Permission Granted              Emotional Assessment Appearance:: Appears stated age Attitude/Demeanor/Rapport: Unable to Assess Affect (typically observed): Unable to Assess Orientation: : Oriented to Self, Oriented to Place, Oriented to  Time, Oriented to Situation Alcohol / Substance Use: Not Applicable Psych Involvement: No (comment)  Admission diagnosis:  COPD exacerbation (HCC) [J44.1] Acute on chronic congestive heart failure, unspecified heart failure type [facility]) [I50.9] Patient Active Problem List   Diagnosis Date Noted   COPD exacerbation (HCC) 12/12/2023   Urinary retention 12/12/2023   Elevated LFTs 12/12/2023   Acute respiratory failure with hypoxia (HCC) 12/12/2023   Dysphagia 12/12/2023   Protein-calorie malnutrition, severe 12/05/2023   Benign prostatic hyperplasia without urinary obstruction 12/02/2023   Disability of walking 12/02/2023   Essential hypertension 12/02/2023   Pure hypercholesterolemia 12/02/2023   Prediabetes 12/02/2023   Acute on chronic systolic CHF (congestive heart failure) (HCC) 12/02/2023   Atrial fibrillation, chronic (HCC) 12/02/2023   COPD with acute exacerbation (HCC) 12/02/2023   Hypernatremia 12/02/2023  AV block 08/24/2023   Coronary artery disease involving native coronary artery of native heart 08/24/2023   HFrEF (heart failure with reduced ejection fraction) (HCC) 08/24/2023   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/08/2018   Spinal stenosis 09/15/2014   Claudication in peripheral vascular disease (HCC)  09/13/2013   PCP:  Nanci Senior, MD Pharmacy:   [id number] [address] [place], [place] - [address] [place] [address] Phone: [phone] Fax: [phone]     Social Drivers of Health (SDOH) Social History: SDOH Screenings   Food Insecurity: No Food Insecurity (12/13/2023)  Housing: Low Risk  (12/13/2023)  Transportation Needs: No Transportation Needs (12/13/2023)  Utilities: Not At Risk (12/13/2023)  Social Connections: Patient Declined (12/13/2023)  Tobacco Use: Low Risk  (12/12/2023)   SDOH Interventions:     Readmission Risk Interventions     No data to display

## 2023-12-15 NOTE — Progress Notes (Signed)
 Mobility Specialist Progress Note:    12/15/23 1443  Mobility  Activity Turned to back - supine (ankle pump. heel slides)  Level of Assistance Standby assist, set-up cues, supervision of patient - no hands on  Assistive Device None  Range of Motion/Exercises Left leg;Right leg  Activity Response Tolerated poorly  Mobility Referral No  Mobility visit 1 Mobility  Mobility Specialist Start Time (ACUTE ONLY) 1443  Mobility Specialist Stop Time (ACUTE ONLY) 1450  Mobility Specialist Time Calculation (min) (ACUTE ONLY) 7 min   Received pt in bed willing to try session. Pt seemed more out of it and not as responsive. Pt attempted exercises but couldn't do full set. Left pt in bed w/ all needs met.  Venetia Keel Mobility Specialist Please Neurosurgeon or Rehab Office at [phone]

## 2023-12-15 NOTE — Progress Notes (Signed)
 Progress Note   Patient: Guy Espinoza FMW:[id number] DOB: 02/02/1927 DOA: 12/12/2023     3 DOS: the patient was seen and examined on 12/15/2023   Brief hospital course: [name] was admitted to the hospital with the working diagnosis of COPD and heart failure exacerbation.   89 yo male with past medical history of heart failure, COPD, atrial fibrillation, coronary artery disease, dyslipidemia and protein calorie malnutrition who presented with dyspnea.  Recent hospitalization 09/05 to 12/12/23 for heart failure and COPD exacerbation. He had 12,6 L negative fluid balance, ad discharged with supplemental 02 per [place].  At arrival to SNF he was in respiratory distress with 40 breaths per minute and 02 saturation 80% on 2,5 L/min per [place], patient was placed on non re breather and transported back to the hospital  On his initial physical examination 109/63, HR 70 RR 26 and 02 saturation 100% on supplemental 02  Lungs with wheezing and rales, heart with S1 and S2 present and regular, abdomen with no distention and no lower extremity edema.   Chest radiograph with hyperinflation with positive cardiomegaly, pacemaker in place with right atrium and right ventricle leads.   09/19 continue very weak and deconditioned.  Started on antibiotic therapy for possible aspiration pneumonia.   Assessment and Plan: Acute on chronic systolic CHF (congestive heart failure) (HCC) Echocardiogram with reduced LV systolic function EF 35 to 40%, with global hypokinesis, grade II diastolic dysfunction pseudo normalization EA, RV systolic function preserved, RVSP 72,8 mmHg, severe dilatation RA and LA, moderate TR   Continue to hold on furosemide  Plan to continue metoprolol  succinate.   Essential hypertension Continue blood pressure control with metoprolol .   Coronary artery disease involving native coronary artery of native heart No chest pain, no acute coronary syndrome.   Atrial fibrillation, chronic (HCC) Continue  rate control with metoprolol , and anticoagulation with apixaban .   Spinal stenosis Continue ain control, PT and OT   COPD with acute exacerbation (HCC) Acute on chronic hypoxemic respiratory failure.  Continue bronchodilator therapy.  Continue prednisone  40 mg po daily.  Continue 02 monitoring and supplemental 02 per [place]  Patient very weak and deconditioned very poor respiratory reserve.  02 saturation is 96% on 2 L/min per [place]   Continue antibiotic therapy for possible aspiration Pneumonia (present on admission) Ceftriaxone  IV Aspiration precautions.   Pure hypercholesterolemia Continue statin therapy   Dysphagia Continue aspiration precautions.   Urinary retention Continue with tamsulosin .  Foley catheter in place.          Subjective: Patient very weak and deconditioned, continue to have dyspnea,   Physical Exam: Vitals:   12/15/23 0805 12/15/23 1138 12/15/23 1151 12/15/23 1601  BP:  125/77 113/82 122/78  Pulse:  69 71 70  Resp:  18  17  Temp:  (!) 97.4 F (36.3 C)  (!) 97.4 F (36.3 C)  TempSrc:  Oral  Oral  SpO2: 98% 100%  100%  Weight:      Height:       Neurology awake, deconditioned ENT with mild pallor Cardiovascular with S1 and S2 present and regular, positive systolic murmur at the left lower sternal border.  No JVD Respiratory with poor inspiratory effort with bilateral rhonchi and rales, with no wheezing Abdomen with no distention  No lower extremity edema Foley catheter in place  Data Reviewed:    Family Communication: I spoke with patient's wife at the bedside, we talked in detail about patient's condition, plan of care and prognosis and all  questions were addressed.   Disposition: Status is: Inpatient Remains inpatient appropriate because: respiratory failure   Planned Discharge Destination: Skilled nursing facility     Author: Elidia Toribio Furnace, MD 12/15/2023 5:41 PM  For on call review [email or web address].

## 2023-12-15 NOTE — Progress Notes (Signed)
 Heart Failure Navigator Progress Note  Assessed for Heart & Vascular TOC clinic readiness.  Patient does not meet criteria due to No HF TOC per Dr. Arrien. Palliative consult for possible out patient Hospice. .   Navigator will sign off at this time.   Stephane H[place], BSN, Scientist, clinical (histocompatibility and immunogenetics) Only

## 2023-12-15 NOTE — Evaluation (Signed)
 Occupational Therapy Evaluation Patient Details Name: Guy Espinoza MRN: [id number] DOB: Jan 05, 1927 Today's Date: 12/15/2023   History of Present Illness   Pt is 89 yo male who was just discharged from the ED on 9/16. Pt was in resp distress on arrival; placed on non-rebreather and sent back to hospital. PMH: HTN, asthma, pacemaker, afib, CAD, CHF, AV block     Clinical Impressions PT admitted with respiratory distress. Pt currently with functional limitiations due to the deficits listed below (see OT problem list). Pt on 3L [place] on arrival and required increase 4L [place] to reach 90% FI02 supine. Pt with extreme DOE 4 out 4 with rolling in the bed. Pt's wife present and reports not able to care for patient at home and open to getting him any help she can. Wife reports to OT that MD mentioned palliative and hospice and her interest in talking to them. Pt incontinence of stool with lack of awareness increases risk for skin break down. Recommend an air mattress with heel support ( pillow boots) to prevent skin break down.  Pt will benefit from skilled OT to increase their independence and safety with adls and balance to allow discharge skilled inpatient follow up therapy, <3 hours/day. .      If plan is discharge home, recommend the following:   A lot of help with walking and/or transfers;A lot of help with bathing/dressing/bathroom     Functional Status Assessment   Patient has had a recent decline in their functional status and demonstrates the ability to make significant improvements in function in a reasonable and predictable amount of time.     Equipment Recommendations   Wheelchair (measurements OT);Wheelchair cushion (measurements OT);Hospital bed;Other (comment) (air mattress)     Recommendations for Other Services         Precautions/Restrictions   Precautions Precautions: Fall Recall of Precautions/Restrictions: Impaired Precaution/Restrictions Comments: bowel  incontinence Restrictions Weight Bearing Restrictions Per Provider Order: No     Mobility Bed Mobility Overal bed mobility: Needs Assistance Bed Mobility: Rolling Rolling: Max assist         General bed mobility comments: pt rolling R and L x3 both directions for hygiene and placement of pads. pt noted to have o2 drop to 80% on 3L [place]. pt with increase to 4L [place] supine rebounded to 90%    Transfers                   General transfer comment: no appropriate at this time due to fatigue and incontinenece      Balance                                           ADL either performed or assessed with clinical judgement   ADL Overall ADL's : Needs assistance/impaired                             Toileting- Clothing Manipulation and Hygiene: Total assistance Toileting - Clothing Manipulation Details (indicate cue type and reason): incontinence of bowel and no awareness       General ADL Comments: bed level care this session due to incontinence and fatigue. Pt with 4 out 4 DOE with bed mobility     Vision Baseline Vision/History: 1 Wears glasses       Perception  Praxis         Pertinent Vitals/Pain Pain Assessment Pain Assessment: No/denies pain Pain Intervention(s): Monitored during session, Repositioned     Extremity/Trunk Assessment Upper Extremity Assessment Upper Extremity Assessment: Generalized weakness   Lower Extremity Assessment Lower Extremity Assessment: Generalized weakness   Cervical / Trunk Assessment Cervical / Trunk Assessment: Kyphotic   Communication Communication Communication: Impaired Factors Affecting Communication: Hearing impaired   Cognition Arousal: Alert Behavior During Therapy: Flat affect Cognition: No apparent impairments             OT - Cognition Comments: pt responding appropriate and states what i didn t hear you showing need to have information repeated                  Following commands: Intact       Cueing  General Comments   Cueing Techniques: Verbal cues;Gestural cues;Tactile cues  redness and skin rash noted from incontinence. barrier cream applied and recommendation for air mattress for pressure relief. pt with strong bony promiences increasing skin break down risk   Exercises     Shoulder Instructions      Home Living Family/patient expects to be discharged to:: Skilled nursing facility Living Arrangements: Spouse/significant other                               Additional Comments: wife present and reports she is open to talking to palliative because the doctor mentioned it 12/14/23. Wife states pt can't come home as she has no support and can not manage him at home at this time. Pt reports i wish i could do it but its just me. if i could have alot of support i would bring him home      Prior Functioning/Environment Prior Level of Function : Independent/Modified Independent             Mobility Comments: Mod I rollator, no falls in 6 months ADLs Comments: Mod I for basic ADLs    OT Problem List: Decreased strength;Decreased range of motion;Decreased activity tolerance;Impaired balance (sitting and/or standing);Decreased safety awareness;Cardiopulmonary status limiting activity   OT Treatment/Interventions: Therapeutic exercise;Self-care/ADL training;Energy conservation;DME and/or AE instruction;Therapeutic activities;Patient/family education;Balance training      OT Goals(Current goals can be found in the care plan section)   Acute Rehab OT Goals Patient Stated Goal: to get him caring for him OT Goal Formulation: With patient/family Time For Goal Achievement: 12/29/23 Potential to Achieve Goals: Fair   OT Frequency:  Min 2X/week    Co-evaluation              AM-PAC OT 6 Clicks Daily Activity     Outcome Measure Help from another person eating meals?: A Little Help from another person  taking care of personal grooming?: A Little Help from another person toileting, which includes using toliet, bedpan, or urinal?: Total Help from another person bathing (including washing, rinsing, drying)?: A Lot Help from another person to put on and taking off regular upper body clothing?: A Lot Help from another person to put on and taking off regular lower body clothing?: Total 6 Click Score: 12   End of Session Nurse Communication: Mobility status;Precautions  Activity Tolerance: Patient limited by fatigue Patient left: in bed;with call bell/phone within reach;with bed alarm set;with family/visitor present  OT Visit Diagnosis: Unsteadiness on feet (R26.81);Other abnormalities of gait and mobility (R26.89);Muscle weakness (generalized) (M62.81)  Time: 8954-8886 OT Time Calculation (min): 28 min Charges:  OT General Charges $OT Visit: 1 Visit OT Evaluation $OT Eval Moderate Complexity: 1 Mod OT Treatments $Self Care/Home Management : 8-22 mins   Brynn, OTR/L  Acute Rehabilitation Services Office: [phone] .   Ely Molt 12/15/2023, 11:31 AM

## 2023-12-16 ENCOUNTER — Inpatient Hospital Stay (HOSPITAL_COMMUNITY)

## 2023-12-16 DIAGNOSIS — I5023 Acute on chronic systolic (congestive) heart failure: Secondary | ICD-10-CM | POA: Diagnosis not present

## 2023-12-16 DIAGNOSIS — I10 Essential (primary) hypertension: Secondary | ICD-10-CM | POA: Diagnosis not present

## 2023-12-16 DIAGNOSIS — J441 Chronic obstructive pulmonary disease with (acute) exacerbation: Secondary | ICD-10-CM | POA: Diagnosis not present

## 2023-12-16 DIAGNOSIS — Z515 Encounter for palliative care: Secondary | ICD-10-CM | POA: Diagnosis not present

## 2023-12-16 DIAGNOSIS — I2510 Atherosclerotic heart disease of native coronary artery without angina pectoris: Secondary | ICD-10-CM | POA: Diagnosis not present

## 2023-12-16 LAB — BASIC METABOLIC PANEL WITH GFR
Anion gap: 9 (ref 5–15)
BUN: 28 mg/dL — ABNORMAL HIGH (ref 8–23)
CO2: 24 mmol/L (ref 22–32)
Calcium: 7.9 mg/dL — ABNORMAL LOW (ref 8.9–10.3)
Chloride: 101 mmol/L (ref 98–111)
Creatinine, Ser: 0.74 mg/dL (ref 0.61–1.24)
GFR, Estimated: >60 mL/min (ref >60)
Glucose, Bld: 124 mg/dL — ABNORMAL HIGH (ref 70–99)
Potassium: 4.5 mmol/L (ref 3.5–5.1)
Sodium: 134 mmol/L — ABNORMAL LOW (ref 135–145)

## 2023-12-16 LAB — CBC
HCT: 35.7 % — ABNORMAL LOW (ref 39.0–52.0)
Hemoglobin: 11.9 g/dL — ABNORMAL LOW (ref 13.0–17.0)
MCH: 31.4 pg (ref 26.0–34.0)
MCHC: 33.3 g/dL (ref 30.0–36.0)
MCV: 94.2 fL (ref 80.0–100.0)
Platelets: 166 K/uL (ref 150–400)
RBC: 3.79 MIL/uL — ABNORMAL LOW (ref 4.22–5.81)
RDW: 13.2 % (ref 11.5–15.5)
WBC: 12.3 K/uL — ABNORMAL HIGH (ref 4.0–10.5)
nRBC: 0 % (ref 0.0–0.2)

## 2023-12-16 NOTE — TOC Initial Note (Signed)
 Transition of Care [facility]) - Initial/Assessment Note    Patient Details  Name: Guy Espinoza MRN: [id number] Date of Birth: 06-05-1926  Transition of Care [facility]) CM/SW Contact:    Montie LOISE Louder, LCSW Phone Number: 12/16/2023, 2:58 PM  Clinical Narrative:                  CSW met with patient and spouse. CSW introduced self and explained role. CSW inquired if the patient was returning to [place]. She is agreeable to patient returning to skilled for short term rehab. Patient spouse states she was informed by MD at [facility] they were unable to manage patient's care. She reports he was at [facility] for 2 hrs before returning to the hospital.  She is hopefull and prefers  [name]. CSW explained the SNF process. CSW was given permission to send out SNF referrals to others in the area. [name] expressed frustration and uncertainty about the process. CSW encourage her to take one day at a time and  explained TOC was here to assist her through this process.    TOC will continue to follow and assist with discharge planning.  Montie Louder, MSW, LCSW Clinical Social Worker    Expected Discharge Plan: Skilled Nursing Facility Barriers to Discharge: Continued Medical Work up, SNF Pending bed offer, Insurance Authorization   Patient Goals and CMS Choice Patient states their goals for this hospitalization and ongoing recovery are:: to go home          Expected Discharge Plan and Services In-house Referral: Clinical Social Work Discharge Planning Services: CM Consult   Living arrangements for the past 2 months: Single Family Home                                      Prior Living Arrangements/Services Living arrangements for the past 2 months: Single Family Home Lives with:: Self, Spouse Patient language and need for interpreter reviewed:: No Do you feel safe going back to the place where you live?: Yes      Need for Family Participation in Patient Care: Yes  (Comment) Care giver support system in place?: Yes (comment)   Criminal Activity/Legal Involvement Pertinent to Current Situation/Hospitalization: No - Comment as needed  Activities of Daily Living   ADL Screening (condition at time of admission) Independently performs ADLs?: No Does the patient have a NEW difficulty with bathing/dressing/toileting/self-feeding that is expected to last >3 days?: No Does the patient have a NEW difficulty with getting in/out of bed, walking, or climbing stairs that is expected to last >3 days?: No Does the patient have a NEW difficulty with communication that is expected to last >3 days?: No Is the patient deaf or have difficulty hearing?: No Does the patient have difficulty seeing, even when wearing glasses/contacts?: No Does the patient have difficulty concentrating, remembering, or making decisions?: No  Permission Sought/Granted Permission sought to share information with : Magazine features editor, Other (comment) (Pt only AX1) Permission granted to share information with : Yes, Verbal Permission Granted  Share Information with NAME: Guy Espinoza  Permission granted to share info w AGENCY: SNFs  Permission granted to share info w Relationship: spouse     Emotional Assessment Appearance:: Appears stated age Attitude/Demeanor/Rapport: Unable to Assess Affect (typically observed): Unable to Assess Orientation: : Oriented to Self Alcohol  / Substance Use: Not Applicable Psych Involvement: No (comment)  Admission diagnosis:  COPD exacerbation (HCC) [email or web address]]  Acute on chronic congestive heart failure, unspecified heart failure type [facility]) [I50.9] Patient Active Problem List   Diagnosis Date Noted   Palliative care encounter 12/16/2023   COPD exacerbation (HCC) 12/12/2023   Urinary retention 12/12/2023   Elevated LFTs 12/12/2023   Acute respiratory failure with hypoxia (HCC) 12/12/2023   Dysphagia 12/12/2023   Protein-calorie malnutrition, severe  12/05/2023   Benign prostatic hyperplasia without urinary obstruction 12/02/2023   Disability of walking 12/02/2023   Essential hypertension 12/02/2023   Pure hypercholesterolemia 12/02/2023   Prediabetes 12/02/2023   Acute on chronic systolic CHF (congestive heart failure) (HCC) 12/02/2023   Atrial fibrillation, chronic (HCC) 12/02/2023   COPD with acute exacerbation (HCC) 12/02/2023   Hypernatremia 12/02/2023   AV block 08/24/2023   Coronary artery disease involving native coronary artery of native heart 08/24/2023   HFrEF (heart failure with reduced ejection fraction) (HCC) 08/24/2023   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/08/2018   Spinal stenosis 09/15/2014   Claudication in peripheral vascular disease (HCC) 09/13/2013   PCP:  Nanci Senior, MD Pharmacy:   [id number] [address], [place] - [address] [place] [address] Phone: [phone] Fax: [phone]     Social Drivers of Health (SDOH) Social History: SDOH Screenings   Food Insecurity: No Food Insecurity (12/13/2023)  Housing: Low Risk  (12/13/2023)  Transportation Needs: No Transportation Needs (12/13/2023)  Utilities: Not At Risk (12/13/2023)  Social Connections: Patient Declined (12/13/2023)  Tobacco Use: Low Risk  (12/12/2023)   SDOH Interventions:     Readmission Risk Interventions     No data to display

## 2023-12-16 NOTE — Consult Note (Signed)
 Palliative Medicine  Name: Guy Espinoza Date: 12/16/2023 MRN: [id number]  DOB: June 12, 1926  Patient Care Team: Nanci Senior, MD as PCP - General (Family Medicine) Loni Soyla LABOR, MD as PCP - Cardiology (Cardiology) Kennyth Chew, MD as PCP - Electrophysiology (Cardiology)    REASON FOR CONSULTATION: Guy Espinoza is a 89 y.o. male with multiple medical problems including COPD, pulmonary hypertension, CAD, A-fib.  Patient was hospitalized 12/01/2023 to 12/12/2023 with acute hypoxic respiratory failure thought secondary to CHF, COPD, and possible aspiration pneumonia.  Patient also treated for UTI.  2D echo showed EF of 35 to 40%.  He had improvement with diuresis and was discharged to SNF on 12/12/2023 but readmitted same day again with respiratory failure.  Palliative care was consulted to address goals.  SOCIAL HISTORY:     reports that he has never smoked. He has never used smokeless tobacco. He reports that he does not drink alcohol  and does not use drugs.  Patient married and lives at home with his wife of 71 years.  They have a daughter in [place].  Patient retired from [employer] and [employer].  ADVANCE DIRECTIVES:  Not on file  CODE STATUS: DNR  PAST MEDICAL HISTORY: Past Medical History:  Diagnosis Date   Asthma    Hypertension     PAST SURGICAL HISTORY:  Past Surgical History:  Procedure Laterality Date   CATARACT EXTRACTION Left 2007   double hernia repair  2010   HERNIA REPAIR  1984   KNEE SURGERY Left 1992   PACEMAKER IMPLANT N/A 08/30/2023   Procedure: PACEMAKER IMPLANT;  Surgeon: Kennyth Chew, MD;  Location: [facility] INVASIVE CV LAB;  Service: Cardiovascular;  Laterality: N/A;   RIGHT/LEFT HEART CATH AND CORONARY ANGIOGRAPHY N/A 08/10/2023   Procedure: RIGHT/LEFT HEART CATH AND CORONARY ANGIOGRAPHY;  Surgeon: Swaziland, Peter M, MD;  Location: [facility] INVASIVE CV LAB;  Service: Cardiovascular;  Laterality: N/A;   ROTATOR CUFF REPAIR Left 2001   ruptured blood  vessel L eye Left 2008    HEMATOLOGY/ONCOLOGY HISTORY:  Oncology History   No history exists.    ALLERGIES:  has no known allergies.  MEDICATIONS:  Current Facility-Administered Medications  Medication Dose Route Frequency Provider Last Rate Last Admin   acetaminophen  (TYLENOL ) tablet 650 mg  650 mg Oral Q6H PRN Doutova, Anastassia, MD       Or   acetaminophen  (TYLENOL ) suppository 650 mg  650 mg Rectal Q6H PRN Doutova, Anastassia, MD       albuterol  (PROVENTIL ) (2.5 MG/3ML) 0.083% nebulizer solution 2.5 mg  2.5 mg Nebulization Q2H PRN Doutova, Anastassia, MD   2.5 mg at 12/14/23 1033   apixaban  (ELIQUIS ) tablet 2.5 mg  2.5 mg Oral BID Doutova, Anastassia, MD   2.5 mg at 12/16/23 1036   atorvastatin  (LIPITOR) tablet 20 mg  20 mg Oral Daily Doutova, Anastassia, MD   20 mg at 12/16/23 1036   budesonide -glycopyrrolate -formoterol  (BREZTRI ) 160-9-4.8 MCG/ACT inhaler 2 puff  2 puff Inhalation BID Doutova, Anastassia, MD   2 puff at 12/15/23 2109   cefTRIAXone  (ROCEPHIN ) 1 g in sodium chloride  0.9 % 100 mL IVPB  1 g Intravenous Q24H Arrien, Mauricio Daniel, MD 200 mL/hr at 12/15/23 1807 1 g at 12/15/23 1807   Chlorhexidine  Gluconate Cloth 2 % PADS 6 each  6 each Topical Daily Simpson, Paula B, NP   6 each at 12/15/23 1000   cyclobenzaprine  (FLEXERIL ) tablet 5 mg  5 mg Oral TID PRN Arrien, Elidia Sieving, MD  5 mg at 12/15/23 1549   diclofenac  Sodium (VOLTAREN ) 1 % topical gel 2 g  2 g Topical QID Arrien, Mauricio Daniel, MD   2 g at 12/16/23 1041   docusate sodium  (COLACE) capsule 100 mg  100 mg Oral BID Doutova, Anastassia, MD   100 mg at 12/16/23 1036   guaiFENesin  (MUCINEX ) 12 hr tablet 600 mg  600 mg Oral BID Doutova, Anastassia, MD   600 mg at 12/16/23 1036   HYDROcodone -acetaminophen  (NORCO/VICODIN) 5-325 MG per tablet 1-2 tablet  1-2 tablet Oral Q4H PRN Doutova, Anastassia, MD       melatonin tablet 3 mg  3 mg Oral QHS Doutova, Anastassia, MD   3 mg at 12/15/23 2143   metoprolol   succinate (TOPROL -XL) 24 hr tablet 12.5 mg  12.5 mg Oral Daily Doutova, Anastassia, MD   12.5 mg at 12/16/23 1036   ondansetron  (ZOFRAN ) tablet 4 mg  4 mg Oral Q6H PRN Doutova, Anastassia, MD       Or   ondansetron  (ZOFRAN ) injection 4 mg  4 mg Intravenous Q6H PRN Doutova, Anastassia, MD       polyethylene glycol (MIRALAX  / GLYCOLAX ) packet 17 g  17 g Oral Daily PRN Doutova, Anastassia, MD       senna (SENOKOT) tablet 8.6 mg  1 tablet Oral BID Doutova, Anastassia, MD   8.6 mg at 12/16/23 1036   tamsulosin  (FLOMAX ) capsule 0.4 mg  0.4 mg Oral QPC supper Doutova, Anastassia, MD   0.4 mg at 12/15/23 1811   traMADol  (ULTRAM ) tablet 50 mg  50 mg Oral Q12H PRN Doutova, Anastassia, MD        VITAL SIGNS: BP 112/84 (BP Location: Right Arm)   Pulse 70   Temp (!) 97.4 F (36.3 C) (Oral)   Resp (P) 18   Ht 5' 9 (1.753 m)   Wt 115 lb 15.4 oz (52.6 kg)   SpO2 100%   BMI 17.12 kg/m  Filed Weights   12/12/23 1710 12/13/23 1522  Weight: 121 lb 0.5 oz (54.9 kg) 115 lb 15.4 oz (52.6 kg)    Estimated body mass index is 17.12 kg/m as calculated from the following:   Height as of this encounter: 5' 9 (1.753 m).   Weight as of this encounter: 115 lb 15.4 oz (52.6 kg).  LABS: CBC:    Component Value Date/Time   WBC 12.3 (H) 12/16/2023 0256   HGB 11.9 (L) 12/16/2023 0256   HGB 12.2 (L) 08/24/2023 1120   HCT 35.7 (L) 12/16/2023 0256   HCT 37.5 08/24/2023 1120   PLT 166 12/16/2023 0256   PLT 230 08/24/2023 1120   MCV 94.2 12/16/2023 0256   MCV 98 (H) 08/24/2023 1120   NEUTROABS 13.0 (H) 12/12/2023 1720   LYMPHSABS 0.3 (L) 12/12/2023 1720   MONOABS 0.6 12/12/2023 1720   EOSABS 0.0 12/12/2023 1720   BASOSABS 0.0 12/12/2023 1720   Comprehensive Metabolic Panel:    Component Value Date/Time   NA 134 (L) 12/16/2023 0256   NA 142 08/24/2023 1120   K 4.5 12/16/2023 0256   CL 101 12/16/2023 0256   CO2 24 12/16/2023 0256   BUN 28 (H) 12/16/2023 0256   BUN 29 08/24/2023 1120   CREATININE  0.74 12/16/2023 0256   GLUCOSE 124 (H) 12/16/2023 0256   CALCIUM  7.9 (L) 12/16/2023 0256   AST 43 (H) 12/13/2023 0421   ALT 82 (H) 12/13/2023 0421   ALKPHOS 62 12/13/2023 0421   BILITOT 0.9 12/13/2023 0421  BILITOT 1.5 (H) 10/24/2023 0948   PROT 5.0 (L) 12/13/2023 0421   PROT 7.0 10/24/2023 0948   ALBUMIN 2.6 (L) 12/13/2023 0421   ALBUMIN 4.5 10/24/2023 0948    RADIOGRAPHIC STUDIES: US  Abdomen Limited RUQ (LIVER/GB) Result Date: 12/12/2023 CLINICAL DATA:  221910 Elevated LFTs 221910 EXAM: ULTRASOUND ABDOMEN LIMITED RIGHT UPPER QUADRANT COMPARISON:  CT abdomen pelvis 06/16/2017 FINDINGS: Gallbladder: No gallstones or wall thickening visualized. No sonographic Murphy sign noted by sonographer. Common bile duct: Diameter: 6 mm. Liver: 1.6 cm left hepatic lobe simple cyst. Increased parenchymal echogenicity. Portal vein is patent on color Doppler imaging with normal direction of blood flow towards the liver. Other: None. IMPRESSION: Hepatic steatosis. Please note limited evaluation for focal hepatic masses in a patient with hepatic steatosis due to decreased penetration of the acoustic ultrasound waves. Electronically Signed   By: Morgane  Naveau M.D.   On: 12/12/2023 23:39   DG Chest Portable 1 View Result Date: 12/12/2023 CLINICAL DATA:  Wheezing.  Shortness of breath. EXAM: PORTABLE CHEST 1 VIEW COMPARISON:  12/09/2023, CT 12/01/2023 FINDINGS: Left-sided pacemaker in place. The heart is enlarged. Aortic atherosclerosis and tortuosity. Improved right lung base aeration. There are small bilateral pleural effusions. Interstitial thickening suspicious for pulmonary edema. No pneumothorax. Chronic shoulder arthropathy. IMPRESSION: 1. Cardiomegaly with interstitial thickening suspicious for pulmonary edema. Small bilateral pleural effusions. 2. Improved right lung base aeration from prior. Electronically Signed   By: Andrea Gasman M.D.   On: 12/12/2023 18:11   DG CHEST PORT 1 VIEW Result Date:  12/09/2023 CLINICAL DATA:  Shortness of breath EXAM: PORTABLE CHEST 1 VIEW COMPARISON:  Film from earlier in the same day. FINDINGS: Cardiac shadow is stable. Pacing device is again seen. Aortic calcifications are noted. The lungs are well aerated bilaterally. Increasing airspace opacity in the right base is noted consistent with developing infiltrate. IMPRESSION: Increasing right basilar infiltrate. Electronically Signed   By: Oneil Devonshire M.D.   On: 12/09/2023 21:05   DG CHEST PORT 1 VIEW Result Date: 12/09/2023 CLINICAL DATA:  Dyspnea EXAM: PORTABLE CHEST 1 VIEW COMPARISON:  12/05/2023 FINDINGS: Dual lead pacer noted. Atherosclerotic calcification of the aortic arch. The patient is rotated to the right on today's radiograph, reducing diagnostic sensitivity and specificity. Substantial degenerative glenohumeral arthropathy bilaterally. Upper normal cardiac size. Improved aeration at the right lung base. Equivocal blunting of the right lateral costophrenic angle, cannot exclude trace right pleural effusion. IMPRESSION: 1. Improved aeration at the right lung base. 2. Equivocal blunting of the right lateral costophrenic angle, cannot exclude trace right pleural effusion. 3. Substantial degenerative glenohumeral arthropathy bilaterally. 4. Aortic Atherosclerosis (ICD10-I70.0). Electronically Signed   By: Ryan Salvage M.D.   On: 12/09/2023 12:54   DG Chest 1 View Result Date: 12/05/2023 CLINICAL DATA:  Short of breath EXAM: CHEST  1 VIEW COMPARISON:  12/04/2023 FINDINGS: 2 frontal views of the chest demonstrates stable dual lead pacemaker. The cardiac silhouette remains enlarged. Continued ectasia and atherosclerosis of the thoracic aorta. Stable veiling opacity at the right lung base consistent with consolidation and/or effusion. Persistent vascular congestion. No pneumothorax. No acute bony abnormalities. IMPRESSION: 1. Stable pulmonary vascular congestion and right basilar consolidation and/or effusion. 2.  Stable enlarged cardiac silhouette. Electronically Signed   By: Ozell Daring M.D.   On: 12/05/2023 17:26   DG Chest 1 View Result Date: 12/04/2023 CLINICAL DATA:  Shortness of breath EXAM: CHEST  1 VIEW COMPARISON:  12/01/2023 FINDINGS: Cardiomegaly. Pacer wires in the right heart, unchanged. Aortic atherosclerosis. Focal  airspace opacity in the right lower lobe, increasing since prior study. Minimal left basilar opacity likely reflects atelectasis. No visible effusions. No acute bony abnormality. IMPRESSION: Cardiomegaly. Worsening right lower lobe airspace opacity concerning for pneumonia. Left base atelectasis. Electronically Signed   By: Franky Crease M.D.   On: 12/04/2023 17:41   DG Swallowing Func-Speech Pathology Result Date: 12/04/2023 Table formatting from the original result was not included. Modified Barium Swallow Study Patient Details Name: DAYVIAN BLIXT MRN: [id number] Date of Birth: [birth year] Today's Date: 12/04/2023 HPI/PMH: HPI: Patient is a 89 y.o. male with PMH: a-fib, HTN, asthma, pacemaker, pulmonary hypertension, severe CAD with multiple blockages per cath 07/2023. He presented to the hospital on 12/02/23 for SOB. CT showed bilateral pleural effusion and pulmonary nodule. SLP swallow evaluation ordered secondary to coughing with PO intake. Clinical Impression: Pt presents with a mild oral dysphagia, a moderate pharyngeal dysphagia, and concerns for an esophageal dysphagia (?primary source of dysphagia) per MBSS completed today. There was aspiration of thin and nectar-thick liquids. Improved airway protection observed with chin tuck with thin liquids. Findings: -There was scant, silent aspiration of thin liquids by spoon during/after the swallow and  frank, audible aspiration of thin liquids by straw before/during the swallow.  Pt with head in neutral position for both trials. -Chin tuck was effective for aiding airway protection completely or reducing airway violation to scant, transient or  stagnant shallow penetration of thin liquids by straw. -There was scant, silent aspiration of nectar-thick liquid wash (post cracker trial) after the swallow. Pt's head was in neutral position. -No penetration or aspiration of nectar-thick liquids by cup. -Shallow penetration without ejection of thin liquids by cup (head in neutral) observed. -There was min-mod diffuse pharyngeal residue following thin, nectar-thick liquids and mod-large oropharyngeal residue following puree and cracker trials. Residue reduced with cued additional swallows x1-2. Oral deficits characterized by reduced oral strength and lingual propulsion resulting in min-moderate oral residue of pudding and solids post initial swallow. Residue eventually reduced with additional swallows. Pharyngeal deficits characterized by reduced hyolaryngeal elevation/excursion, reduced base of tongue retraction, absent epiglottic inversion, reduced laryngeal vestibule closure, and reduced pharyngeal stripping. Concerns for esophageal dysphagia included moderate rentention of pudding and solid trials in the mid-lower esophagus with some retrograde flow below the UES. Pudding residue did clear with a liquid wash; however, the cracker residue remained despite multiple liquid washes. Due to esophageal retention of solids, discussed diet modification to mechanical ground/altered with pt and wife. Detailed diet recommendations outlined below. Please see follow up treatment note for further information pertaining to education and diet modification after discussion with pt and wife. Factors that may increase risk of adverse event in presence of aspiration Noe & Lianne 2021): Factors that may increase risk of adverse event in presence of aspiration Noe & Lianne 2021): Frail or deconditioned; Weak cough Recommendations/Plan: Swallowing Evaluation Recommendations Swallowing Evaluation Recommendations Recommendations: PO diet PO Diet Recommendation: Dysphagia 2  (Finely chopped); Thin liquids (Level 0) Liquid Administration via: Straw (with chin tuck!) Medication Administration: Whole meds with puree Supervision: Full supervision/cueing for swallowing strategies Swallowing strategies  : Minimize environmental distractions; Slow rate; Small bites/sips; Follow solids with liquids; Multiple dry swallows after each bite/sip; Chin tuck Postural changes: Position pt fully upright for meals; Stay upright 30-60 min after meals Oral care recommendations: Oral care BID (2x/day) Recommended consults: Consider esophageal assessment Treatment Plan Treatment Plan Treatment recommendations: Therapy as outlined in treatment plan below Follow-up recommendations: Skilled nursing-short term rehab (<3 hours/day) Functional  status assessment: Patient has had a recent decline in their functional status and demonstrates the ability to make significant improvements in function in a reasonable and predictable amount of time. Treatment frequency: Min 1x/week Treatment duration: 2 weeks Interventions: Aspiration precaution training; Compensatory techniques; Patient/family education; Diet toleration management by SLP Recommendations Recommendations for follow up therapy are one component of a multi-disciplinary discharge planning process, led by the attending physician.  Recommendations may be updated based on patient status, additional functional criteria and insurance authorization. Assessment: Orofacial Exam: Orofacial Exam Oral Cavity: Oral Hygiene: WFL Oral Cavity - Dentition: Dentures, top (partial) Orofacial Anatomy: WFL Oral Motor/Sensory Function: WFL Anatomy: Anatomy: Prominent cricopharyngeus (appearance of a CP bar below UES - limited view due to shoulder position, though did not appear to impact bolus flow) Boluses Administered: Boluses Administered Boluses Administered: Thin liquids (Level 0); Mildly thick liquids (Level 2, nectar thick); Puree; Solid  Oral Impairment Domain: Oral  Impairment Domain Lip Closure: No labial escape Tongue control during bolus hold: Cohesive bolus between tongue to palatal seal Bolus preparation/mastication: Slow prolonged chewing/mashing with complete recollection Bolus transport/lingual motion: Brisk tongue motion Oral residue: Residue collection on oral structures (with puree and cracker) Location of oral residue : Tongue; Palate Initiation of pharyngeal swallow : Valleculae (WNL for pt's age)  Pharyngeal Impairment Domain: Pharyngeal Impairment Domain Soft palate elevation: No bolus between soft palate (SP)/pharyngeal wall (PW) Laryngeal elevation: Partial superior movement of thyroid cartilage/partial approximation of arytenoids to epiglottic petiole Anterior hyoid excursion: Partial anterior movement Epiglottic movement: No inversion Laryngeal vestibule closure: Incomplete, narrow column air/contrast in laryngeal vestibule Pharyngeal stripping wave : Present - diminished Pharyngeal contraction (A/P view only): N/A Pharyngoesophageal segment opening: Partial distention/partial duration, partial obstruction of flow Tongue base retraction: Narrow column of contrast or air between tongue base and PPW Pharyngeal residue: Collection of residue within or on pharyngeal structures  Esophageal Impairment Domain: Esophageal Impairment Domain Esophageal clearance upright position: Esophageal retention with retrograde flow below pharyngoesophageal segment (PES) Pill: No data recorded Penetration/Aspiration Scale Score: Penetration/Aspiration Scale Score 1.  Material does not enter airway: Thin liquids (Level 0); Mildly thick liquids (Level 2, nectar thick) 2.  Material enters airway, remains ABOVE vocal cords then ejected out: Thin liquids (Level 0); Mildly thick liquids (Level 2, nectar thick) 3.  Material enters airway, remains ABOVE vocal cords and not ejected out: Thin liquids (Level 0); Mildly thick liquids (Level 2, nectar thick); Puree; Solid 6.  Material enters  airway, passes BELOW cords then ejected out: Mildly thick liquids (Level 2, nectar thick) 7.  Material enters airway, passes BELOW cords and not ejected out despite cough attempt by patient: Thin liquids (Level 0) 8.  Material enters airway, passes BELOW cords without attempt by patient to eject out (silent aspiration) : Thin liquids (Level 0); Mildly thick liquids (Level 2, nectar thick) Compensatory Strategies: Compensatory Strategies Compensatory strategies: Yes Straw: Effective (only with chin tuck; worsened airway protection without using chin tuck strategy) Effective Straw: Thin liquid (Level 0) Multiple swallows: Effective (reduced pharygneal residue) Effective Multiple Swallows: Thin liquid (Level 0); Puree; Solid; Mildly thick liquid (Level 2, nectar thick) Chin tuck: Effective Effective Chin Tuck: Thin liquid (Level 0)   General Information: Caregiver present: Yes (at bedside following study)  Diet Prior to this Study: Regular; Thin liquids (Level 0)   Temperature : Normal   Respiratory Status: Increased WOB   Supplemental O2: Nasal cannula (1L)   History of Recent Intubation: No  Behavior/Cognition: Alert; Cooperative Self-Feeding Abilities: Able  to self-feed Baseline vocal quality/speech: Dysphonic Volitional Cough: Able to elicit Volitional Swallow: Able to elicit (delayed) Exam Limitations: Limited visibility (shoulder position) Goal Planning: Prognosis for improved oropharyngeal function: Fair Barriers to Reach Goals: Overall medical prognosis No data recorded Patient/Family Stated Goal: safe PO intake Consulted and agree with results and recommendations: Patient; Family member/caregiver; Nurse Pain: Pain Assessment Pain Assessment: No/denies pain End of Session: Start Time:SLP Start Time (ACUTE ONLY): 1030 Stop Time: SLP Stop Time (ACUTE ONLY): 1100 Time Calculation:SLP Time Calculation (min) (ACUTE ONLY): 30 min Charges: SLP Evaluations $ SLP Speech Visit: 1 Visit SLP Evaluations $MBS Swallow: 1  Procedure SLP visit diagnosis: SLP Visit Diagnosis: Dysphagia, oropharyngeal phase (R13.12) Past Medical History: Past Medical History: Diagnosis Date  Asthma   Hypertension  Past Surgical History: Past Surgical History: Procedure Laterality Date  CATARACT EXTRACTION Left 2007  double hernia repair  2010  HERNIA REPAIR  1984  KNEE SURGERY Left 1992  PACEMAKER IMPLANT N/A 08/30/2023  Procedure: PACEMAKER IMPLANT;  Surgeon: Kennyth Chew, MD;  Location: [facility] INVASIVE CV LAB;  Service: Cardiovascular;  Laterality: N/A;  RIGHT/LEFT HEART CATH AND CORONARY ANGIOGRAPHY N/A 08/10/2023  Procedure: RIGHT/LEFT HEART CATH AND CORONARY ANGIOGRAPHY;  Surgeon: Swaziland, Peter M, MD;  Location: [facility] INVASIVE CV LAB;  Service: Cardiovascular;  Laterality: N/A;  ROTATOR CUFF REPAIR Left 2001  ruptured blood vessel L eye Left 2008 Peyton JINNY Rummer 12/04/2023, 12:47 PM  CT Chest Wo Contrast Result Date: 12/01/2023 CLINICAL DATA:  Short of breath EXAM: CT CHEST WITHOUT CONTRAST TECHNIQUE: Multidetector CT imaging of the chest was performed following the standard protocol without IV contrast. RADIATION DOSE REDUCTION: This exam was performed according to the departmental dose-optimization program which includes automated exposure control, adjustment of the mA and/or kV according to patient size and/or use of iterative reconstruction technique. COMPARISON:  12/01/2023, 07/31/2023, 06/03/2003 FINDINGS: Cardiovascular: Unenhanced imaging of the heart demonstrates cardiomegaly without pericardial effusion. Dual lead cardiac pacer is identified, proximal lead within the right atrium and distal lead within the right ventricle. Normal caliber of the thoracic aorta. Atherosclerosis of the aorta and coronary vasculature. Assessment of the vascular lumen cannot be performed without intravenous contrast. Mediastinum/Nodes: No significant abnormalities of the thyroid, trachea, or esophagus. No pathologic adenopathy. Lungs/Pleura: There are small  free-flowing bilateral pleural effusions, right greater than left. Dependent areas of consolidation within the lower lobes most consistent with atelectasis. No pneumothorax. Stable 7 mm right upper lobe Peri fissural subpleural nodule, reference image 97/3. Upper Abdomen: No acute upper abdominal findings. Musculoskeletal: No acute or destructive bony abnormalities. Severe bilateral shoulder osteoarthritis. Reconstructed images demonstrate no additional findings. IMPRESSION: 1. Small free-flowing bilateral pleural effusions with dependent lower lobe atelectasis. 2. Stable 7 mm right upper lobe perifissural subpleural pulmonary nodule. As discussed on prior CT exam, minimal change since 06/03/2003 exam suggest benignity, and confirmation with a follow-up CT May 2026 recommended. 3. Mild cardiomegaly. 4. Aortic Atherosclerosis (ICD10-I70.0). Coronary artery atherosclerosis Electronically Signed   By: Ozell Daring M.D.   On: 12/01/2023 15:33   DG Chest Port 1 View Result Date: 12/01/2023 CLINICAL DATA:  Shortness of breath. EXAM: PORTABLE CHEST 1 VIEW COMPARISON:  August 30, 2023 FINDINGS: There is stable dual lead AICD positioning. The cardiac silhouette is mildly enlarged. Mild, diffuse, chronic appearing increased interstitial lung markings are seen. Mild areas of atelectasis and/or infiltrate are noted within the bilateral lung bases. No pleural effusion or pneumothorax is identified. Multilevel degenerative changes are seen throughout the thoracic spine. IMPRESSION: Mild bibasilar  atelectasis and/or infiltrate. Electronically Signed   By: Suzen Dials M.D.   On: 12/01/2023 13:39   CUP PACEART INCLINIC DEVICE CHECK Result Date: 12/01/2023 Normal in-clinic dual chamber pacemaker check. Presenting Rhythm: AS-VP (AF). Routine testing of thresholds, sensing, and impedance demonstrate stable parameters and no programming changes needed at this time. Unable to check atrial threshold with AF. On-going AF x ~ 1  month. Estimated longevity 17yr35mo. Pt enrolled in remote follow-up.   PERFORMANCE STATUS (ECOG) : 3 - Symptomatic, >50% confined to bed  Review of Systems Unless otherwise noted, a complete review of systems is negative.  Physical Exam General: Thin, frail, ill-appearing Cardiovascular: Irregular Pulmonary: clear ant fields, on O2 Abdomen: soft, nontender, + bowel sounds GU: no suprapubic tenderness Extremities: no edema, no joint deformities Skin: no rashes Neurological: Weakness, some confusion  IMPRESSION: Patient 89 with multiple medical problems including COPD, cardiomyopathy, A-fib, who was hospitalized with CHF/COPD exacerbation and readmitted with same.  Patient also being treated for possible aspiration pneumonia.  Patient drowsy and situationally confused. He says his breathing is not much improved today. O2 requirements are up to 4L.   Met with patient's wife to discuss goals.  She has been married to him for 71 years.  Together, they live at home alone.  There is a daughter in [place] who is involved.  At baseline, patient was mostly independent with his own care.  However, wife had noted the patient has become increasingly frailer over the past 6 months.  He was still trying to exercise on a stationary bike but ambulation had become difficult. Patient had repeatedly fallen.  Wife verbalized agreement with current scope of treatment.  She would like to give patient time to see if he clinically improves.  However, she also recognizes that at his age, and with his comorbidities, it is possible that he further declines.  She also recognizes that he could be nearing end-of-life.    She voiced concern that she would not be able to care for patient if he were to return home in his present condition.  She is hopeful that if he improves, that he can go to rehab.  If he declines, she said that she would consider hospice involvement.  We also discussed possible option of palliative  care following rehab if that is his ultimate disposition.  Patient is a DNR/DNI.  PLAN: - Continue current scope of treatment - Dispo: Likely rehab with palliative care following - Consider possible hospice involvement in the event of decline - Will have PMT follow   Time Total: 50 minutes  Visit consisted of counseling and education dealing with the complex and emotionally intense issues of symptom management and palliative care in the setting of serious and potentially life-threatening illness.Greater than 50%  of this time was spent counseling and coordinating care related to the above assessment and plan.  Signed by: Fonda Mower, PhD, NP-C

## 2023-12-16 NOTE — Progress Notes (Addendum)
 Progress Note   Patient: Guy Espinoza FMW:[id number] DOB: 10-Aug-1926 DOA: 12/12/2023     4 DOS: the patient was seen and examined on 12/16/2023   Brief hospital course: Guy Espinoza was admitted to the hospital with the working diagnosis of COPD and heart failure exacerbation.   89 yo male with past medical history of heart failure, COPD, atrial fibrillation, coronary artery disease, dyslipidemia and protein calorie malnutrition who presented with dyspnea.  Recent hospitalization 09/05 to 12/12/23 for heart failure and COPD exacerbation. He had 12,6 L negative fluid balance, ad discharged with supplemental 02 per [place].  At arrival to SNF he was in respiratory distress with 40 breaths per minute and 02 saturation 80% on 2,5 L/min per [place], patient was placed on non re breather and transported back to the hospital  On his initial physical examination 109/63, HR 70 RR 26 and 02 saturation 100% on supplemental 02  Lungs with wheezing and rales, heart with S1 and S2 present and regular, abdomen with no distention and no lower extremity edema.   Chest radiograph with hyperinflation with positive cardiomegaly, pacemaker in place with right atrium and right ventricle leads.   09/19 continue very weak and deconditioned.  Started on antibiotic therapy for possible aspiration pneumonia.  09/20 patient with more stable oxygenation and symptoms, plan for SNF with palliative care and transition to hospice if further decompensation. Discuss with his wife at the bedside.   Assessment and Plan: Acute on chronic systolic CHF (congestive heart failure) (HCC) Echocardiogram with reduced LV systolic function EF 35 to 40%, with global hypokinesis, grade II diastolic dysfunction pseudo normalization EA, RV systolic function preserved, RVSP 72,8 mmHg, severe dilatation RA and LA, moderate TR   Continue to hold on furosemide  Limited medical therapy due to patient's frailty  Plan to continue metoprolol  succinate.    Essential hypertension Continue blood pressure control with metoprolol .   Coronary artery disease involving native coronary artery of native heart No chest pain, no acute coronary syndrome.   Atrial fibrillation, chronic (HCC) Continue rate control with metoprolol , and anticoagulation with apixaban .  Ok to discontinue telemetry.   Spinal stenosis Continue ain control, PT and OT   COPD with acute exacerbation (HCC) Acute on chronic hypoxemic respiratory failure.  Continue bronchodilator therapy.  Stop prednisone .  Continue 02 monitoring and supplemental 02 per [place]  Patient very weak and deconditioned very poor respiratory reserve.  02 saturation is 100% on 2 L/min per    Continue antibiotic therapy for possible aspiration Pneumonia (present on admission) Ceftriaxone  IV Aspiration precautions.   Pure hypercholesterolemia Continue statin therapy   Dysphagia Continue aspiration precautions.   Urinary retention Continue with tamsulosin .  Foley catheter in place.       Subjective: Patient with no chest pain, dyspnea has improved, continue very weak and deconditioned   Physical Exam: Vitals:   12/15/23 2110 12/16/23 0031 12/16/23 0416 12/16/23 0825  BP:  107/68 114/78 112/84  Pulse: 70   70  Resp: 18 19 20 20   Temp:  (!) 97.5 F (36.4 C) (!) 97.5 F (36.4 C) (!) 97.4 F (36.3 C)  TempSrc:  Oral Oral Oral  SpO2:    100%  Weight:      Height:       Neurology awake and alert, deconditioned ENT with mild pallor with no icterus Cardiovascular with S1 and S2 present, irregular with no gallops or rubs, positive systolic murmur at the left lower sternal border Respiratory with prolonged expiratory phase with bilateral  rhonchi with no rales Abdomen with no distention  No lower extremity edema Foley in place  Data Reviewed:    Family Communication: I spoke with patient's wife at the bedside, we talked in detail about patient's condition, plan of care and  prognosis and all questions were addressed.   Disposition: Status is: Inpatient Remains inpatient appropriate because: pending transfer to SNF   Planned Discharge Destination: Skilled nursing facility    Author: Elidia Toribio Furnace, MD 12/16/2023 11:07 AM  For on call review [email or web address].

## 2023-12-16 NOTE — Plan of Care (Signed)

## 2023-12-16 NOTE — Significant Event (Signed)
 During report pt stated that he had a Bowel moved. Night Nurse and writer proceeded to change soiled bed. Pt became Resp. distress Desating 80s. 4L, increased 02 [place] to 6L, Sats 80s-90s, Course crackles, Labored. Called Resp Therapy, NEB treatment and Non-Rebreather, Paged MD. See New order.

## 2023-12-16 NOTE — NC FL2 (Addendum)
 [place]  MEDICAID FL2 LEVEL OF CARE FORM     IDENTIFICATION  Patient Name: Guy Espinoza Birthdate: May 15, 1926 Sex: male Admission Date (Current Location): 12/12/2023  [facility] and [place] Number:  Producer, television/film/video and Address:  [facility], [address] [place], [place] [address]      Provider Number: [id number]  Attending Physician Name and Address:  Noralee Elidia Toribio DEWAINE  Relative Name and Phone Number:       Current Level of Care: Hospital Recommended Level of Care: Skilled Nursing Facility Prior Approval Number:    Date Approved/Denied:   PASRR Number: [id number]  Discharge Plan: SNF    Current Diagnoses: Patient Active Problem List   Diagnosis Date Noted   Palliative care encounter 12/16/2023   COPD exacerbation (HCC) 12/12/2023   Urinary retention 12/12/2023   Elevated LFTs 12/12/2023   Acute respiratory failure with hypoxia (HCC) 12/12/2023   Dysphagia 12/12/2023   Protein-calorie malnutrition, severe 12/05/2023   Benign prostatic hyperplasia without urinary obstruction 12/02/2023   Disability of walking 12/02/2023   Essential hypertension 12/02/2023   Pure hypercholesterolemia 12/02/2023   Prediabetes 12/02/2023   Acute on chronic systolic CHF (congestive heart failure) (HCC) 12/02/2023   Atrial fibrillation, chronic (HCC) 12/02/2023   COPD with acute exacerbation (HCC) 12/02/2023   Hypernatremia 12/02/2023   AV block 08/24/2023   Coronary artery disease involving native coronary artery of native heart 08/24/2023   HFrEF (heart failure with reduced ejection fraction) (HCC) 08/24/2023   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/08/2018   Spinal stenosis 09/15/2014   Claudication in peripheral vascular disease (HCC) 09/13/2013    Orientation RESPIRATION BLADDER Height & Weight     Self    External catheter, Incontinent Weight: 115 lb 15.4 oz (52.6 kg) Height:  5' 9 (175.3 cm)  BEHAVIORAL SYMPTOMS/MOOD NEUROLOGICAL BOWEL  NUTRITION STATUS      Continent Diet (please see discharge summary)  AMBULATORY STATUS COMMUNICATION OF NEEDS Skin   Extensive Assist Verbally Other (Comment) (irritant dermatitis perineum left, eccymosis bilateral arm)                       Personal Care Assistance Level of Assistance  Bathing, Feeding, Dressing Bathing Assistance: Maximum assistance Feeding assistance: Limited assistance Dressing Assistance: Maximum assistance     Functional Limitations Info  Sight, Hearing, Speech Sight Info: Impaired (glasses) Hearing Info: Impaired Speech Info: Adequate    SPECIAL CARE FACTORS FREQUENCY  PT (By licensed PT), OT (By licensed OT)     PT Frequency: 5x per week OT Frequency: 5x per week            Contractures Contractures Info: Not present    Additional Factors Info  Code Status, Allergies Code Status Info: DNR Limited Allergies Info: NKA           Current Medications (12/16/2023):  This is the current hospital active medication list Current Facility-Administered Medications  Medication Dose Route Frequency Provider Last Rate Last Admin   acetaminophen  (TYLENOL ) tablet 650 mg  650 mg Oral Q6H PRN Doutova, Anastassia, MD       Or   acetaminophen  (TYLENOL ) suppository 650 mg  650 mg Rectal Q6H PRN Doutova, Anastassia, MD       albuterol  (PROVENTIL ) (2.5 MG/3ML) 0.083% nebulizer solution 2.5 mg  2.5 mg Nebulization Q2H PRN Doutova, Anastassia, MD   2.5 mg at 12/14/23 1033   apixaban  (ELIQUIS ) tablet 2.5 mg  2.5 mg Oral BID Doutova, Anastassia, MD  2.5 mg at 12/16/23 1036   atorvastatin  (LIPITOR) tablet 20 mg  20 mg Oral Daily Doutova, Anastassia, MD   20 mg at 12/16/23 1036   budesonide -glycopyrrolate -formoterol  (BREZTRI ) 160-9-4.8 MCG/ACT inhaler 2 puff  2 puff Inhalation BID Doutova, Anastassia, MD   2 puff at 12/15/23 2109   cefTRIAXone  (ROCEPHIN ) 1 g in sodium chloride  0.9 % 100 mL IVPB  1 g Intravenous Q24H Arrien, Mauricio Daniel, MD 200 mL/hr at 12/15/23  1807 1 g at 12/15/23 1807   Chlorhexidine  Gluconate Cloth 2 % PADS 6 each  6 each Topical Daily Simpson, Paula B, NP   6 each at 12/15/23 1000   cyclobenzaprine  (FLEXERIL ) tablet 5 mg  5 mg Oral TID PRN Arrien, Mauricio Daniel, MD   5 mg at 12/15/23 1549   diclofenac  Sodium (VOLTAREN ) 1 % topical gel 2 g  2 g Topical QID Arrien, Elidia Sieving, MD   2 g at 12/16/23 1041   docusate sodium  (COLACE) capsule 100 mg  100 mg Oral BID Doutova, Anastassia, MD   100 mg at 12/16/23 1036   guaiFENesin  (MUCINEX ) 12 hr tablet 600 mg  600 mg Oral BID Doutova, Anastassia, MD   600 mg at 12/16/23 1036   HYDROcodone -acetaminophen  (NORCO/VICODIN) 5-325 MG per tablet 1-2 tablet  1-2 tablet Oral Q4H PRN Doutova, Anastassia, MD       melatonin tablet 3 mg  3 mg Oral QHS Doutova, Anastassia, MD   3 mg at 12/15/23 2143   metoprolol  succinate (TOPROL -XL) 24 hr tablet 12.5 mg  12.5 mg Oral Daily Doutova, Anastassia, MD   12.5 mg at 12/16/23 1036   ondansetron  (ZOFRAN ) tablet 4 mg  4 mg Oral Q6H PRN Doutova, Anastassia, MD       Or   ondansetron  (ZOFRAN ) injection 4 mg  4 mg Intravenous Q6H PRN Doutova, Anastassia, MD       polyethylene glycol (MIRALAX  / GLYCOLAX ) packet 17 g  17 g Oral Daily PRN Doutova, Anastassia, MD       senna (SENOKOT) tablet 8.6 mg  1 tablet Oral BID Doutova, Anastassia, MD   8.6 mg at 12/16/23 1036   tamsulosin  (FLOMAX ) capsule 0.4 mg  0.4 mg Oral QPC supper Doutova, Anastassia, MD   0.4 mg at 12/15/23 1811   traMADol  (ULTRAM ) tablet 50 mg  50 mg Oral Q12H PRN Doutova, Anastassia, MD         Discharge Medications: Please see discharge summary for a list of discharge medications.  Relevant Imaging Results:  Relevant Lab Results:   Additional Information SSN  [id number] palliative to follow at [facility]  Montie LOISE Louder, LCSW

## 2023-12-17 DIAGNOSIS — Z7189 Other specified counseling: Secondary | ICD-10-CM | POA: Diagnosis not present

## 2023-12-17 DIAGNOSIS — Z515 Encounter for palliative care: Secondary | ICD-10-CM | POA: Diagnosis not present

## 2023-12-17 DIAGNOSIS — I509 Heart failure, unspecified: Secondary | ICD-10-CM | POA: Diagnosis not present

## 2023-12-17 DIAGNOSIS — R638 Other symptoms and signs concerning food and fluid intake: Secondary | ICD-10-CM

## 2023-12-17 DIAGNOSIS — Z711 Person with feared health complaint in whom no diagnosis is made: Secondary | ICD-10-CM

## 2023-12-17 DIAGNOSIS — I10 Essential (primary) hypertension: Secondary | ICD-10-CM | POA: Diagnosis not present

## 2023-12-17 DIAGNOSIS — I2510 Atherosclerotic heart disease of native coronary artery without angina pectoris: Secondary | ICD-10-CM | POA: Diagnosis not present

## 2023-12-17 DIAGNOSIS — J441 Chronic obstructive pulmonary disease with (acute) exacerbation: Secondary | ICD-10-CM | POA: Diagnosis not present

## 2023-12-17 DIAGNOSIS — Z66 Do not resuscitate: Secondary | ICD-10-CM

## 2023-12-17 DIAGNOSIS — I5023 Acute on chronic systolic (congestive) heart failure: Secondary | ICD-10-CM | POA: Diagnosis not present

## 2023-12-17 DIAGNOSIS — Z789 Other specified health status: Secondary | ICD-10-CM

## 2023-12-17 MED ORDER — LORAZEPAM 2 MG/ML IJ SOLN
0.5000 mg | Freq: Once | INTRAMUSCULAR | Status: AC
  Administered 2023-12-17: 0.5 mg via INTRAVENOUS
  Filled 2023-12-17: qty 1

## 2023-12-17 NOTE — Plan of Care (Signed)

## 2023-12-17 NOTE — Progress Notes (Signed)
 Progress Note   Patient: Guy Espinoza FMW:[id number] DOB: 06-Mar-1927 DOA: 12/12/2023     5 DOS: the patient was seen and examined on 12/17/2023   Brief hospital course: Guy Espinoza was admitted to the hospital with the working diagnosis of COPD and heart failure exacerbation.   89 yo male with past medical history of heart failure, COPD, atrial fibrillation, coronary artery disease, dyslipidemia and protein calorie malnutrition who presented with dyspnea.  Recent hospitalization 09/05 to 12/12/23 for heart failure and COPD exacerbation. He had 12,6 L negative fluid balance, ad discharged with supplemental 02 per [place].  At arrival to SNF he was in respiratory distress with 40 breaths per minute and 02 saturation 80% on 2,5 L/min per [place], patient was placed on non re breather and transported back to the hospital  On his initial physical examination 109/63, HR 70 RR 26 and 02 saturation 100% on supplemental 02  Lungs with wheezing and rales, heart with S1 and S2 present and regular, abdomen with no distention and no lower extremity edema.   Chest radiograph with hyperinflation with positive cardiomegaly, pacemaker in place with right atrium and right ventricle leads.   09/19 continue very weak and deconditioned.  Started on antibiotic therapy for possible aspiration pneumonia.  09/20 patient with more stable oxygenation and symptoms, plan for SNF with palliative care and transition to hospice if further decompensation. Discuss with his wife at the bedside.  09/21 last night had worsening dyspnea and required non invasive mechanical ventilation. Chest radiograph with possible retro cardiac infiltrate.   Assessment and Plan: Acute on chronic systolic CHF (congestive heart failure) (HCC) Echocardiogram with reduced LV systolic function EF 35 to 40%, with global hypokinesis, grade II diastolic dysfunction pseudo normalization EA, RV systolic function preserved, RVSP 72,8 mmHg, severe dilatation RA and LA,  moderate TR   Continue to hold on furosemide  Limited medical therapy due to patient's frailty  Plan to continue metoprolol  succinate.   Essential hypertension Continue blood pressure control with metoprolol .   Coronary artery disease involving native coronary artery of native heart No chest pain, no acute coronary syndrome.   Atrial fibrillation, chronic (HCC) Continue rate control with metoprolol , and anticoagulation with apixaban .  Off telemetry.    Spinal stenosis Patient very weak and deconditioned.   COPD with acute exacerbation (HCC) Acute on chronic hypoxemic respiratory failure.  Continue bronchodilator therapy.  Continue 02 monitoring and supplemental 02 per [place]  Patient very weak and deconditioned very poor respiratory reserve.  This am is off bipap and 02 saturation 97% on 5 L.min per [place] Chest radiograph with possible left retrocardiac infiltrate, I suspect recurrent aspiration.    Continue antibiotic therapy for aspiration Pneumonia (present on admission) Ceftriaxone  IV Aspiration precautions.  Very poor prognosis with worsening respiratory status. Possible transition to residential hospice.   Pure hypercholesterolemia Continue statin therapy   Dysphagia Continue aspiration precautions.   Urinary retention Continue with tamsulosin .  Foley catheter in place.        Subjective: patient had respiratory distress last night and required Bipap, this am off bipap but continue very weak and deconditioned.   Physical Exam: Vitals:   12/17/23 0735 12/17/23 0859 12/17/23 1159 12/17/23 1200  BP: 127/81   112/68  Pulse: 74   68  Resp: 15   20  Temp: (!) 97.5 F (36.4 C)   97.6 F (36.4 C)  TempSrc: Temporal  Oral Oral  SpO2: 98% 97%  97%  Weight:      Height:  Neurology somnolent, deconditioned and ill looking appearing  ENT mild pallor Cardiovascular with S1 and S2 present, regular with no gallops or rubs, positive systolic murmur at the left lower  sternal border Respiratory with very poor inspiratory effort, with bilateral rales, and rhonchi, no wheezing.  Positive use of accessory respiratory muscle.  Abdomen with no distention  No lower extremity edema    Data Reviewed:    Family Communication: I spoke with patient's wife at the bedside, we talked in detail about patient's condition, plan of care and prognosis and all questions were addressed.   Disposition: Status is: Inpatient Remains inpatient appropriate because: respiratory failure   Planned Discharge Destination: possible residential hospice.     Author: Elidia Toribio Furnace, MD 12/17/2023 2:15 PM  For on call review [email or web address].

## 2023-12-17 NOTE — Plan of Care (Signed)
  Problem: Clinical Measurements: Goal: Respiratory complications will improve Outcome: Progressing   Problem: Nutrition: Goal: Adequate nutrition will be maintained Outcome: Progressing   Problem: Coping: Goal: Level of anxiety will decrease Outcome: Progressing   Problem: Safety: Goal: Ability to remain free from injury will improve Outcome: Progressing   Problem: Education: Goal: Knowledge of disease or condition will improve Outcome: Progressing   Problem: Activity: Goal: Ability to tolerate increased activity will improve Outcome: Progressing   Problem: Respiratory: Goal: Ability to maintain a clear airway will improve Outcome: Progressing Goal: Levels of oxygenation will improve Outcome: Progressing Goal: Ability to maintain adequate ventilation will improve Outcome: Progressing

## 2023-12-17 NOTE — Progress Notes (Signed)
 Daily Progress Note   Patient Name: Guy Espinoza       Date: 12/17/2023 DOB: [birth year]  Age: 89 y.o. MRN#: [id number] Attending Physician: Noralee Elidia Toribio DEWAINE Primary Care Physician: Nanci Senior, MD Admit Date: 12/12/2023  Reason for Consultation/Follow-up: Establishing goals of care  Subjective: I have reviewed medical records including EPIC notes, MAR, any available advanced directives as necessary, and labs. Received report from primary RN - no acute concerns. Per RN, patient is lethargic today with minimal oral intake.   Went to visit patient at bedside - wife/Guy Espinoza present. Patient was lying in bed - he does not wake to voice/gentle touch. No signs or non-verbal gestures of pain or discomfort noted. No respiratory distress, increased work of breathing, or secretions noted. He is ill and frail appearing. He is on 5L O2 [place].  Emotional support provided to wife. She expresses that she is not doing well. Therapeutic listening provided as she reflects on the course of patient's hospitalizations and decline. She has a clear understanding of his current acute medical situation and recognizes that he is, unfortunately, not improving - he is declining. Education provided that poor oral intake is a poor prognostic indicator.  Medical pathways reviewed to continue aggressive care vs transition to full comfort/hospice care. We discussed that patient would likely not benefit from further rehabilitation. Discussed that the goal of rehab is improvement/stabalization of functional status,  which can be a difficult goal to meet for patients with advanced illness and multiple medical conditions. Reviewed what is needed for someone to have a positive rehabilitation experience to include adequate  nutritional intake as well as willingness/ability to participate. [name] agrees that patient would not do well with further rehabilitation attempts but is not yet ready to make final decisions regarding hospice.  Guy Espinoza would like to continue current treatment for another day - if no significant improvements, or if decline, she will be ready for hospice referral tomorrow. She requests [facility] as this facility is close to her home.  No escalation of care in the event of decline - call and discuss with her.  All questions and concerns addressed. Encouraged to call with questions and/or concerns. PMT card provided.  Length of Stay: 5  Current Medications: Scheduled Meds:   apixaban   2.5 mg Oral BID   atorvastatin   20 mg  Oral Daily   budesonide -glycopyrrolate -formoterol   2 puff Inhalation BID   Chlorhexidine  Gluconate Cloth  6 each Topical Daily   diclofenac  Sodium  2 g Topical QID   docusate sodium   100 mg Oral BID   guaiFENesin   600 mg Oral BID   melatonin  3 mg Oral QHS   metoprolol  succinate  12.5 mg Oral Daily   senna  1 tablet Oral BID   tamsulosin   0.4 mg Oral QPC supper    Continuous Infusions:  cefTRIAXone  (ROCEPHIN )  IV 1 g (12/16/23 1836)    PRN Meds: acetaminophen  **OR** acetaminophen , albuterol , cyclobenzaprine , HYDROcodone -acetaminophen , ondansetron  **OR** ondansetron  (ZOFRAN ) IV, polyethylene glycol, traMADol   Physical Exam Vitals and nursing note reviewed.  Constitutional:      General: He is not in acute distress.    Appearance: He is ill-appearing.  Pulmonary:     Effort: No respiratory distress.  Skin:    General: Skin is warm and dry.  Neurological:     Mental Status: He is lethargic.     Motor: Weakness present.             Vital Signs: BP 112/68 (BP Location: Right Arm)   Pulse 68   Temp 97.6 F (36.4 C) (Oral)   Resp 20   Ht 5' 9 (1.753 m)   Wt 52.6 kg   SpO2 97%   BMI 17.12 kg/m  SpO2: SpO2: 97 % O2 Device: O2 Device: Nasal  Cannula O2 Flow Rate: O2 Flow Rate (L/min): 5 L/min  Intake/output summary:  Intake/Output Summary (Last 24 hours) at 12/17/2023 1230 Last data filed at 12/17/2023 0555 Gross per 24 hour  Intake 700 ml  Output 400 ml  Net 300 ml   LBM: Last BM Date : 12/17/23 Baseline Weight: Weight: 54.9 kg Most recent weight: Weight: 52.6 kg       Palliative Assessment/Data: PPS 10-20%      Patient Active Problem List   Diagnosis Date Noted   Palliative care encounter 12/16/2023   COPD exacerbation (HCC) 12/12/2023   Urinary retention 12/12/2023   Elevated LFTs 12/12/2023   Acute respiratory failure with hypoxia (HCC) 12/12/2023   Dysphagia 12/12/2023   Protein-calorie malnutrition, severe 12/05/2023   Benign prostatic hyperplasia without urinary obstruction 12/02/2023   Disability of walking 12/02/2023   Essential hypertension 12/02/2023   Pure hypercholesterolemia 12/02/2023   Prediabetes 12/02/2023   Acute on chronic systolic CHF (congestive heart failure) (HCC) 12/02/2023   Atrial fibrillation, chronic (HCC) 12/02/2023   COPD with acute exacerbation (HCC) 12/02/2023   Hypernatremia 12/02/2023   AV block 08/24/2023   Coronary artery disease involving native coronary artery of native heart 08/24/2023   HFrEF (heart failure with reduced ejection fraction) (HCC) 08/24/2023   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/08/2018   Spinal stenosis 09/15/2014   Claudication in peripheral vascular disease (HCC) 09/13/2013    Palliative Care Assessment & Plan   Patient Profile: Guy Espinoza is a 89 y.o. male with multiple medical problems including COPD, pulmonary hypertension, CAD, A-fib.  Patient was hospitalized 12/01/2023 to 12/12/2023 with acute hypoxic respiratory failure thought secondary to CHF, COPD, and possible aspiration pneumonia.  Patient also treated for UTI.  2D echo showed EF of 35 to 40%.  He had improvement with diuresis and was discharged to SNF on 12/12/2023 but readmitted same day again  with respiratory failure.  Palliative care was consulted to address goals.   Assessment: Active Problems:   Coronary artery disease involving native coronary artery of native heart  Essential hypertension   Spinal stenosis   Pure hypercholesterolemia   Acute on chronic systolic CHF (congestive heart failure) (HCC)   Atrial fibrillation, chronic (HCC)   COPD with acute exacerbation [facility])   Urinary retention   Dysphagia   Palliative care encounter   Concern about end of life  Recommendations/Plan: Continue conservative management to treat the treatable for another 24 hours Continue DNR-Limited as previously documented If patient shows no significant improvements, or declines, wife will be ready for inpatient hospice referral tomorrow 9/22 - requesting [facility] Pending patient's clinical status and further GOC tomorrow, will also discuss option of transition to full comfort in house  No escalation of care in the event of decline - call and discuss comfort care with wife PMT will continue to follow and support holistically  Goals of Care and Additional Recommendations: Limitations on Scope of Treatment: No Tracheostomy  Code Status:    Code Status Orders  (From admission, onward)           Start     Ordered   12/12/23 2202  Do not attempt resuscitation (DNR)- Limited -Do Not Intubate (DNI)  Continuous       Question Answer Comment  If pulseless and not breathing No CPR or chest compressions.   In Pre-Arrest Conditions (Patient Is Breathing and Has A Pulse) Do not intubate. Provide all appropriate non-invasive medical interventions. Avoid ICU transfer unless indicated or required.   Consent: Discussion documented in EHR or advanced directives reviewed      12/12/23 2204           Code Status History     Date Active Date Inactive Code Status Order ID Comments User Context   12/05/2023 1940 12/12/2023 1659 Limited: Do not attempt resuscitation (DNR) -DNR-LIMITED -Do  Not Intubate/DNI  [id number]  Noralee Elidia Sieving, MD Inpatient   12/02/2023 0208 12/05/2023 1940 Full Code [id number]  Arthea Child, MD ED   08/30/2023 1620 08/31/2023 0102 Full Code [id number]  Kennyth Chew, MD Inpatient       Prognosis:  poor  Discharge Planning: To Be Determined  Care plan was discussed with primary RN, patient's wife, Dr. Noralee, [facility]  Thank you for allowing the Palliative Medicine Team to assist in the care of this patient.     Jeoffrey CHRISTELLA Sharps, NP  Please contact Palliative Medicine Team phone at [phone] for questions and concerns.   *Portions of this note are a verbal dictation therefore any spelling and/or grammatical errors are due to the Dragon Medical One system interpretation.

## 2023-12-18 DIAGNOSIS — R0989 Other specified symptoms and signs involving the circulatory and respiratory systems: Secondary | ICD-10-CM

## 2023-12-18 DIAGNOSIS — I2510 Atherosclerotic heart disease of native coronary artery without angina pectoris: Secondary | ICD-10-CM | POA: Diagnosis not present

## 2023-12-18 DIAGNOSIS — Z515 Encounter for palliative care: Secondary | ICD-10-CM | POA: Diagnosis not present

## 2023-12-18 DIAGNOSIS — J441 Chronic obstructive pulmonary disease with (acute) exacerbation: Secondary | ICD-10-CM | POA: Diagnosis not present

## 2023-12-18 DIAGNOSIS — Z7189 Other specified counseling: Secondary | ICD-10-CM | POA: Diagnosis not present

## 2023-12-18 DIAGNOSIS — I509 Heart failure, unspecified: Secondary | ICD-10-CM | POA: Diagnosis not present

## 2023-12-18 DIAGNOSIS — I5023 Acute on chronic systolic (congestive) heart failure: Secondary | ICD-10-CM | POA: Diagnosis not present

## 2023-12-18 DIAGNOSIS — I10 Essential (primary) hypertension: Secondary | ICD-10-CM | POA: Diagnosis not present

## 2023-12-18 DIAGNOSIS — R0602 Shortness of breath: Secondary | ICD-10-CM

## 2023-12-18 LAB — CBC WITH DIFFERENTIAL/PLATELET
Abs Immature Granulocytes: 0.09 K/uL — ABNORMAL HIGH (ref 0.00–0.07)
Basophils Absolute: 0 K/uL (ref 0.0–0.1)
Basophils Relative: 0 %
Eosinophils Absolute: 0.2 K/uL (ref 0.0–0.5)
Eosinophils Relative: 1 %
HCT: 35.2 % — ABNORMAL LOW (ref 39.0–52.0)
Hemoglobin: 11.5 g/dL — ABNORMAL LOW (ref 13.0–17.0)
Immature Granulocytes: 1 %
Lymphocytes Relative: 5 %
Lymphs Abs: 0.8 K/uL (ref 0.7–4.0)
MCH: 30.8 pg (ref 26.0–34.0)
MCHC: 32.7 g/dL (ref 30.0–36.0)
MCV: 94.4 fL (ref 80.0–100.0)
Monocytes Absolute: 0.6 K/uL (ref 0.1–1.0)
Monocytes Relative: 4 %
Neutro Abs: 14.6 K/uL — ABNORMAL HIGH (ref 1.7–7.7)
Neutrophils Relative %: 89 %
Platelets: 104 K/uL — ABNORMAL LOW (ref 150–400)
RBC: 3.73 MIL/uL — ABNORMAL LOW (ref 4.22–5.81)
RDW: 13.4 % (ref 11.5–15.5)
WBC: 16.3 K/uL — ABNORMAL HIGH (ref 4.0–10.5)
nRBC: 0 % (ref 0.0–0.2)

## 2023-12-18 LAB — BASIC METABOLIC PANEL WITH GFR
Anion gap: 8 (ref 5–15)
BUN: 24 mg/dL — ABNORMAL HIGH (ref 8–23)
CO2: 25 mmol/L (ref 22–32)
Calcium: 7.9 mg/dL — ABNORMAL LOW (ref 8.9–10.3)
Chloride: 102 mmol/L (ref 98–111)
Creatinine, Ser: 1.08 mg/dL (ref 0.61–1.24)
GFR, Estimated: >60 mL/min (ref >60)
Glucose, Bld: 118 mg/dL — ABNORMAL HIGH (ref 70–99)
Potassium: 4.5 mmol/L (ref 3.5–5.1)
Sodium: 135 mmol/L (ref 135–145)

## 2023-12-18 MED ORDER — BIOTENE DRY MOUTH MT LIQD
15.0000 mL | Freq: Two times a day (BID) | OROMUCOSAL | Status: DC
  Administered 2023-12-18: 15 mL via TOPICAL

## 2023-12-18 MED ORDER — MORPHINE SULFATE (PF) 2 MG/ML IV SOLN
2.0000 mg | Freq: Four times a day (QID) | INTRAVENOUS | Status: DC
  Administered 2023-12-18 (×2): 2 mg via INTRAVENOUS
  Filled 2023-12-18 (×2): qty 1

## 2023-12-18 MED ORDER — DIPHENHYDRAMINE HCL 50 MG/ML IJ SOLN: 25.0000 mg | INTRAMUSCULAR | Status: DC | PRN

## 2023-12-18 MED ORDER — HALOPERIDOL LACTATE 5 MG/ML IJ SOLN: 2.0000 mg | Freq: Four times a day (QID) | INTRAMUSCULAR | Status: DC | PRN

## 2023-12-18 MED ORDER — GLYCOPYRROLATE 0.2 MG/ML IJ SOLN
0.4000 mg | INTRAMUSCULAR | Status: DC
  Administered 2023-12-18 (×2): 0.4 mg via INTRAVENOUS
  Filled 2023-12-18 (×2): qty 2

## 2023-12-18 MED ORDER — LORAZEPAM 2 MG/ML IJ SOLN: 1.0000 mg | INTRAMUSCULAR | Status: DC | PRN

## 2023-12-18 MED ORDER — ONDANSETRON HCL 4 MG/2ML IJ SOLN: 4.0000 mg | Freq: Four times a day (QID) | INTRAMUSCULAR | Status: DC | PRN

## 2023-12-18 MED ORDER — LORAZEPAM 2 MG/ML PO CONC: 1.0000 mg | ORAL | Status: DC | PRN

## 2023-12-18 MED ORDER — GLYCOPYRROLATE 0.2 MG/ML IJ SOLN: 0.2000 mg | INTRAMUSCULAR | Status: DC | PRN

## 2023-12-18 MED ORDER — ONDANSETRON 4 MG PO TBDP: 4.0000 mg | ORAL_TABLET | Freq: Four times a day (QID) | ORAL | Status: DC | PRN

## 2023-12-18 MED ORDER — LORAZEPAM 1 MG PO TABS: 1.0000 mg | ORAL_TABLET | ORAL | Status: DC | PRN

## 2023-12-18 MED ORDER — GLYCOPYRROLATE 1 MG PO TABS
1.0000 mg | ORAL_TABLET | ORAL | Status: DC | PRN
  Filled 2023-12-18: qty 1

## 2023-12-18 MED ORDER — HALOPERIDOL LACTATE 2 MG/ML PO CONC
2.0000 mg | Freq: Four times a day (QID) | ORAL | Status: DC | PRN
  Filled 2023-12-18: qty 5

## 2023-12-18 MED ORDER — HALOPERIDOL 1 MG PO TABS
2.0000 mg | ORAL_TABLET | Freq: Four times a day (QID) | ORAL | Status: DC | PRN
  Filled 2023-12-18: qty 2

## 2023-12-18 MED ORDER — POLYVINYL ALCOHOL 1.4 % OP SOLN
1.0000 [drp] | Freq: Four times a day (QID) | OPHTHALMIC | Status: DC | PRN
  Filled 2023-12-18: qty 15

## 2023-12-18 MED ORDER — MORPHINE SULFATE (PF) 2 MG/ML IV SOLN: 2.0000 mg | INTRAVENOUS | Status: DC | PRN

## 2023-12-18 NOTE — Progress Notes (Signed)
 Daily Progress Note   Patient Name: Guy Espinoza       Date: 12/18/2023 DOB: Oct 11, 1926  Age: 89 y.o. MRN#: [id number] Attending Physician: Noralee Elidia Toribio DEWAINE Primary Care Physician: Nanci Senior, MD Admit Date: 12/12/2023  Reason for Consultation/Follow-up: Establishing goals of care  Subjective: I have reviewed medical records including EPIC notes, MAR, and labs. Received report from primary RN - no acute concerns. Discussed case with Dr. Arrien.  Went to visit patient at bedside - wife/[name] present. Patient was lying in bed awake, alert, and able to participate in simple conversation. Signs and non-verbal gestures of discomfort noted. No respiratory distress or increased work of breathing; secretions noted. Patient tells me he doesn't feel good. He denies pain but endorses shortness of breath. He is on 2L O2 [place].  [name] tells me she and the patient have been discussing hospice care and are ready for hospice referral today. She understands he is at high risk for rehospitalization and tells me, I don't want him discharged back to rehab and end up back here...this has been a lot on him. Patient nods his head in agreement.   [name] requests hospice referral to be sent to [facility], as this is close to her home.  During visit wife was attempting to feed patient lunch. When asked, patient states that he is not hungry. [name] wants him to eat a little something. Education provided that decreased oral intake is part of the natural trajectory at EOL. Concepts related to comfort feeding were discussed in detail and she expressed understanding.  All questions and concerns addressed. Encouraged to call with questions and/or concerns. PMT card previously provided.  Length of  Stay: 6  Current Medications: Scheduled Meds:   apixaban   2.5 mg Oral BID   atorvastatin   20 mg Oral Daily   budesonide -glycopyrrolate -formoterol   2 puff Inhalation BID   Chlorhexidine  Gluconate Cloth  6 each Topical Daily   diclofenac  Sodium  2 g Topical QID   docusate sodium   100 mg Oral BID   guaiFENesin   600 mg Oral BID   melatonin  3 mg Oral QHS   metoprolol  succinate  12.5 mg Oral Daily   senna  1 tablet Oral BID   tamsulosin   0.4 mg Oral QPC supper    Continuous Infusions:  cefTRIAXone  (ROCEPHIN )  IV 1  g (12/17/23 1806)    PRN Meds: acetaminophen  **OR** acetaminophen , albuterol , cyclobenzaprine , HYDROcodone -acetaminophen , ondansetron  **OR** ondansetron  (ZOFRAN ) IV, polyethylene glycol, traMADol   Physical Exam Vitals and nursing note reviewed.  Constitutional:      General: He is not in acute distress.    Appearance: He is ill-appearing.  Pulmonary:     Effort: No respiratory distress.     Comments: Shortness of breath Skin:    General: Skin is warm and dry.  Neurological:     Mental Status: He is oriented to person, place, and time. He is lethargic.     Motor: Weakness present.  Psychiatric:        Behavior: Behavior is cooperative.             Vital Signs: BP 109/68 (BP Location: Right Arm)   Pulse 84   Temp (!) 97.5 F (36.4 C) (Oral)   Resp 19   Ht 5' 9 (1.753 m)   Wt 52.6 kg   SpO2 97%   BMI 17.12 kg/m  SpO2: SpO2: 97 % O2 Device: O2 Device: Nasal Cannula O2 Flow Rate: O2 Flow Rate (L/min): 4 L/min  Intake/output summary:  Intake/Output Summary (Last 24 hours) at 12/18/2023 1208 Last data filed at 12/18/2023 1000 Gross per 24 hour  Intake 180 ml  Output 1000 ml  Net -820 ml   LBM: Last BM Date : 12/17/23 Baseline Weight: Weight: 54.9 kg Most recent weight: Weight: 52.6 kg       Palliative Assessment/Data: PPS 10%      Patient Active Problem List   Diagnosis Date Noted   Palliative care encounter 12/16/2023   COPD exacerbation  (HCC) 12/12/2023   Urinary retention 12/12/2023   Elevated LFTs 12/12/2023   Acute respiratory failure with hypoxia (HCC) 12/12/2023   Dysphagia 12/12/2023   Protein-calorie malnutrition, severe 12/05/2023   Benign prostatic hyperplasia without urinary obstruction 12/02/2023   Disability of walking 12/02/2023   Essential hypertension 12/02/2023   Pure hypercholesterolemia 12/02/2023   Prediabetes 12/02/2023   Acute on chronic systolic CHF (congestive heart failure) (HCC) 12/02/2023   Atrial fibrillation, chronic (HCC) 12/02/2023   COPD with acute exacerbation (HCC) 12/02/2023   Hypernatremia 12/02/2023   AV block 08/24/2023   Coronary artery disease involving native coronary artery of native heart 08/24/2023   HFrEF (heart failure with reduced ejection fraction) (HCC) 08/24/2023   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/08/2018   Spinal stenosis 09/15/2014   Claudication in peripheral vascular disease (HCC) 09/13/2013    Palliative Care Assessment & Plan   Patient Profile: Guy Espinoza is a 89 y.o. male with multiple medical problems including COPD, pulmonary hypertension, CAD, A-fib. Patient was hospitalized 12/01/2023 to 12/12/2023 with acute hypoxic respiratory failure thought secondary to CHF, COPD, and possible aspiration pneumonia. Patient also treated for UTI. 2D echo showed EF of 35 to 40%. He had improvement with diuresis and was discharged to SNF on 12/12/2023 but readmitted same day again with respiratory failure. Palliative care was consulted to address goals.   Assessment: Active Problems:   Coronary artery disease involving native coronary artery of native heart   Essential hypertension   Spinal stenosis   Pure hypercholesterolemia   Acute on chronic systolic CHF (congestive heart failure) (HCC)   Atrial fibrillation, chronic (HCC)   COPD with acute exacerbation [facility])   Urinary retention   Dysphagia   Palliative care encounter   Terminal care  Recommendations/Plan: Initiated  full comfort measures Continue DNR/DNI as previously documented Wife interested in patient's transfer to  residential hospice facility, requesting [facility] - TOC consult placed; TOC and hospice liaison notified Added orders for EOL symptom management as noted below, as well as discontinued orders that were not focused on comfort Unrestricted visitation orders were placed per current [place] EOL visitation policy  Nursing to provide frequent assessments and administer PRN medications as clinically necessary to ensure EOL comfort PMT will continue to follow and support holistically   Symptom Management Scheduled morphine  q6h for shortness of breath; PRN doses for breakthrough pain/dyspnea/increased work of breathing/RR>25 Tylenol  PRN pain/fever Biotin twice daily Benadryl  PRN itching Robinul  PRN secretions Haldol  PRN agitation/delirium Ativan  PRN anxiety/seizure/sleep/distress Zofran  PRN nausea/vomiting Liquifilm Tears PRN dry eye Contiue inhaler and voltaren  gel as tolerated for comfort  Continue flexeril  PRN,  mucinex , Norco PRN, senna, and flowmax for comfort while tolerating POs  Goals of Care and Additional Recommendations: Limitations on Scope of Treatment: Full Comfort Care  Code Status:    Code Status Orders  (From admission, onward)           Start     Ordered   12/12/23 2202  Do not attempt resuscitation (DNR)- Limited -Do Not Intubate (DNI)  Continuous       Question Answer Comment  If pulseless and not breathing No CPR or chest compressions.   In Pre-Arrest Conditions (Patient Is Breathing and Has A Pulse) Do not intubate. Provide all appropriate non-invasive medical interventions. Avoid ICU transfer unless indicated or required.   Consent: Discussion documented in EHR or advanced directives reviewed      12/12/23 2204           Code Status History     Date Active Date Inactive Code Status Order ID Comments User Context   12/05/2023 1940 12/12/2023  1659 Limited: Do not attempt resuscitation (DNR) -DNR-LIMITED -Do Not Intubate/DNI  [id number]  Noralee Elidia Sieving, MD Inpatient   12/02/2023 0208 12/05/2023 1940 Full Code [id number]  Arthea Child, MD ED   08/30/2023 1620 08/31/2023 0102 Full Code [id number]  Kennyth Chew, MD Inpatient       Prognosis:  < 2 weeks  Discharge Planning: Hospice facility  Care plan was discussed with primary RN, patient, patient's wife, TOC, Dr. Noralee, hospice liaison  Thank you for allowing the Palliative Medicine Team to assist in the care of this patient.     Jeoffrey CHRISTELLA Sharps, NP  Please contact Palliative Medicine Team phone at [phone] for questions and concerns.   *Portions of this note are a verbal dictation therefore any spelling and/or grammatical errors are due to the Dragon Medical One system interpretation.

## 2023-12-18 NOTE — TOC Transition Note (Addendum)
 Transition of Care [facility]) - Discharge Note   Patient Details  Name: Guy Espinoza MRN: [id number] Date of Birth: 04/25/1926  Transition of Care [facility]) CM/SW Contact:  Luise JAYSON Pan, LCSWA Phone Number: 12/18/2023, 4:11 PM   Clinical Narrative:   Patient will DC to: [facility] Anticipated DC date: 12/18/23  Family notified: [name] (spouse) Transport by: [facility]   Per MD patient ready for DC to [facility]. RN to call report prior to discharge [phone]). RN, patient, patient's family, and facility notified of DC. Discharge Summary and FL2 sent to facility. DC packet on chart. Ambulance transport requested for patient 4:12PM.   CSW will sign off for now as social work intervention is no longer needed. Please consult us  again if new needs arise.  Final next level of care: Hospice Medical Facility Barriers to Discharge: Barriers Resolved   Patient Goals and CMS Choice Patient states their goals for this hospitalization and ongoing recovery are:: to go home          Discharge Placement              Patient chooses bed at: Other - please specify in the comment section below: [facility]) Patient to be transferred to facility by: [facility] Name of family member notified: Berle (spouse) Patient and family notified of of transfer: 12/18/23  Discharge Plan and Services Additional resources added to the After Visit Summary for   In-house Referral: Clinical Social Work Discharge Planning Services: CM Consult                                 Social Drivers of Health (SDOH) Interventions SDOH Screenings   Food Insecurity: No Food Insecurity (12/13/2023)  Housing: Low Risk  (12/13/2023)  Transportation Needs: No Transportation Needs (12/13/2023)  Utilities: Not At Risk (12/13/2023)  Social Connections: Patient Declined (12/13/2023)  Tobacco Use: Low Risk  (12/12/2023)     Readmission Risk Interventions     No data to display

## 2023-12-18 NOTE — Plan of Care (Signed)

## 2023-12-18 NOTE — Progress Notes (Signed)
 [facility] hospital liaison note   Referral received from TOC Janee for family interest in [facility].   Met with patient and wife in room to explain services and hospice philosophy and all questions answered.  [facility] is able to accept patient this afternoon once consents are complete.    RN staff, you may call report at any time to [phone], room is assigned when report is called.  Please leave IV intact and send completed DNR with patient.   Updated attending and [facility] manager via [employer].  Thank you for the opportunity to participate in this patient's care.  Amy Darien BSN, RN [facility] Liaison [phone]

## 2023-12-18 NOTE — Plan of Care (Signed)

## 2023-12-18 NOTE — TOC Progression Note (Addendum)
 Transition of Care [facility]) - Progression Note    Patient Details  Name: Guy Espinoza MRN: [id number] Date of Birth: Aug 19, 1926  Transition [facility]) CM/SW Contact  Luise JAYSON Pan, [place] Phone Number: 12/18/2023, 11:40 AM  Clinical Narrative:   Per Palliative note 9/21, if patient shows no significant improvement/declines may be ready for inpatient hospice referral to [facility].   12:27 PM Per Palliative, patients wife transitioned patient to comfort care. Patients wife would like [facility]. Palliative initiated referral to [facility].   CSW will continue to follow.    Expected Discharge Plan: Skilled Nursing Facility Barriers to Discharge: Continued Medical Work up, SNF Pending bed offer, English as a second language teacher               Expected Discharge Plan and Services In-house Referral: Clinical Social Work Discharge Planning Services: CM Consult   Living arrangements for the past 2 months: Single Family Home                                       Social Drivers of Health (SDOH) Interventions SDOH Screenings   Food Insecurity: No Food Insecurity (12/13/2023)  Housing: Low Risk  (12/13/2023)  Transportation Needs: No Transportation Needs (12/13/2023)  Utilities: Not At Risk (12/13/2023)  Social Connections: Patient Declined (12/13/2023)  Tobacco Use: Low Risk  (12/12/2023)    Readmission Risk Interventions     No data to display

## 2023-12-18 NOTE — Progress Notes (Signed)
 Physical Therapy Treatment Patient Details Name: GENTLE HOGE MRN: [id number] DOB: April 10, 1926 Today's Date: 12/18/2023   History of Present Illness 89 yo M adm 12/12/23 after having just D/C'd in respiratory distress. PMH: Admission 9/5-9/16 for CHF/COPD. HTN, asthma, PPM, Afib, CAD, CHF, AV block, spinal stenosis, HLD, PVD, COPD    PT Comments  Pt with flat affect, improved mobility limited by fatigue and tolerating movement on 2L. Pt continues to require physical assist for all mobility and transfers with plan for return home with family vs possible hospice and as long as pt has 24hr assist this is feasible. Pt enjoyed getting OOB and will continue to progress mobility as pt able to tolerate.   Supine BP 104/64 (76) In chair 106/68 (80) SPO2 93-97%on 2L    If plan is discharge home, recommend the following: Help with stairs or ramp for entrance;Assist for transportation;A lot of help with walking and/or transfers;Supervision due to cognitive status;Assistance with cooking/housework;A lot of help with bathing/dressing/bathroom   Can travel by private vehicle        Equipment Recommendations  None recommended by PT    Recommendations for Other Services       Precautions / Restrictions Precautions Precautions: Fall Recall of Precautions/Restrictions: Impaired     Mobility  Bed Mobility Overal bed mobility: Needs Assistance Bed Mobility: Supine to Sit     Supine to sit: Min assist, HOB elevated, Used rails     General bed mobility comments: min assist to roll to right and elevate trunk from surface, increased time, use of rail and SPO2 on 2L. assist of pad to fully scoot to EOB    Transfers Overall transfer level: Needs assistance   Transfers: Sit to/from Stand Sit to Stand: Min assist           General transfer comment: min assist to rise from bed with posterior bias , RW present, cues for hand placement and pivot bed to chair. Pt able to rise again from chair with  assist to walk    Ambulation/Gait Ambulation/Gait assistance: Mod assist, +2 safety/equipment Gait Distance (Feet): 7 Feet Assistive device: Rolling walker (2 wheels) Gait Pattern/deviations: Decreased stride length, Trunk flexed, Step-through pattern, Shuffle   Gait velocity interpretation: <1.31 ft/sec, indicative of household ambulator   General Gait Details: pt with posterior bias, flexed trunk and physical assist to advance gait anteriorly with RW and close chair follow. Pt walked 4' forward and 3' back, reliant on RW and chair for safety limited by fatigue on 2L   Stairs             Wheelchair Mobility     Tilt Bed    Modified Rankin (Stroke Patients Only)       Balance Overall balance assessment: Needs assistance Sitting-balance support: No upper extremity supported, Feet supported Sitting balance-Leahy Scale: Fair     Standing balance support: Bilateral upper extremity supported, During functional activity, Reliant on assistive device for balance Standing balance-Leahy Scale: Poor Standing balance comment: Reliant on UB on RW and external support                            Communication Communication Communication: Impaired Factors Affecting Communication: Hearing impaired  Cognition Arousal: Alert Behavior During Therapy: Flat affect   PT - Cognitive impairments: Attention, Initiation, Problem solving, Safety/Judgement, Awareness, Memory   Orientation impairments: Time  PT - Cognition Comments: slow processing tendency to keep eyes closed when static Following commands: Impaired Following commands impaired: Follows one step commands with increased time    Cueing Cueing Techniques: Verbal cues, Gestural cues, Tactile cues  Exercises General Exercises - Lower Extremity Long Arc Quad: AROM, Both, 10 reps, Seated, Strengthening Hip Flexion/Marching: AROM, Both, 10 reps, Seated, Strengthening    General Comments         Pertinent Vitals/Pain Pain Assessment Pain Assessment: No/denies pain    Home Living                          Prior Function            PT Goals (current goals can now be found in the care plan section) Progress towards PT goals: Progressing toward goals    Frequency    Min 1X/week      PT Plan      Co-evaluation              AM-PAC PT 6 Clicks Mobility   Outcome Measure  Help needed turning from your back to your side while in a flat bed without using bedrails?: A Little Help needed moving from lying on your back to sitting on the side of a flat bed without using bedrails?: A Little Help needed moving to and from a bed to a chair (including a wheelchair)?: A Little Help needed standing up from a chair using your arms (e.g., wheelchair or bedside chair)?: A Little Help needed to walk in hospital room?: Total Help needed climbing 3-5 steps with a railing? : Total 6 Click Score: 14    End of Session Equipment Utilized During Treatment: Oxygen;Gait belt Activity Tolerance: Patient limited by fatigue Patient left: in chair;with call bell/phone within reach;with chair alarm set Nurse Communication: Mobility status PT Visit Diagnosis: Unsteadiness on feet (R26.81);Other abnormalities of gait and mobility (R26.89);Muscle weakness (generalized) (M62.81);Difficulty in walking, not elsewhere classified (R26.2)     Time: 9169-9150 PT Time Calculation (min) (ACUTE ONLY): 19 min  Charges:    $Therapeutic Activity: 8-22 mins PT General Charges $$ ACUTE PT VISIT: 1 Visit                     Lenoard SQUIBB, PT Acute Rehabilitation Services Office: [phone]    Lenoard NOVAK Jerime Arif 12/18/2023, 11:11 AM

## 2023-12-18 NOTE — Discharge Summary (Signed)
 Physician Discharge Summary   Patient: Guy Espinoza MRN: [id number] DOB: 28-Apr-1926  Admit date:     12/12/2023  Discharge date: 12/18/23  Discharge Physician: Elidia Sieving Cyla Haluska   PCP: Nanci Senior, MD   Recommendations at discharge:   Patient with progressive and worsening respiratory status, I suspect recurrent aspirations.  Very poor respiratory reserve, very poor oral intake, with rapid deterioration.  He and his family decided to transition to comfort care under hospice services.  Continue aspiration precautions and as needed supplemental 02 for comfort.  Follow up with Dr Nanci in 7 to 10 days.   I spoke with patient's wife at the bedside, we talked in detail about patient's condition, plan of care and prognosis and all questions were addressed.   Discharge Diagnoses: Active Problems:   Acute on chronic systolic CHF (congestive heart failure) (HCC)   Essential hypertension   Coronary artery disease involving native coronary artery of native heart   Atrial fibrillation, chronic (HCC)   Spinal stenosis   COPD with acute exacerbation (HCC)   Pure hypercholesterolemia   Dysphagia   Urinary retention   Palliative care encounter  Resolved Problems:   * No resolved hospital problems. [facility] Course: Guy Espinoza was admitted to the hospital with the working diagnosis of COPD, aspiration pneumonia and heart failure exacerbation.   89 yo male with past medical history of heart failure, COPD, atrial fibrillation, coronary artery disease, dyslipidemia and protein calorie malnutrition who presented with dyspnea.  Recent hospitalization 09/05 to 12/12/23 for heart failure and COPD exacerbation. He had 12,6 L negative fluid balance, ad discharged with supplemental 02 per [place].  At arrival to SNF he was in respiratory distress with 40 breaths per minute and 02 saturation 80% on 2,5 L/min per [place], patient was placed on non re breather and transported back to the hospital  On his  initial physical examination 109/63, HR 70 RR 26 and 02 saturation 100% on supplemental 02  Lungs with wheezing and rales, heart with S1 and S2 present and regular, abdomen with no distention and no lower extremity edema.   Na 139, K 4.6 Cl 0103 bicarbonate 28 glucose 134 bun 34 cr 0,99  AST 55 ALT 95  BNP 418  High sensitive troponin 14 and 15  Procalcitonin < 0.10 Wbc 14.1 hgb 12.5 plt 187   Chest radiograph with hyperinflation with positive cardiomegaly, pacemaker in place with right atrium and right ventricle leads.   EKG 70 bpm, left axis deviation, left bundle branch block, qtc 517, ventricular paced rhythm with underlying atrial flutter, with no significant ST segment or T waves changes.   09/19 continue very weak and deconditioned.  Started on antibiotic therapy for possible aspiration pneumonia.  09/20 patient with more stable oxygenation and symptoms, plan for SNF with palliative care and transition to hospice if further decompensation. Discuss with his wife at the bedside.  09/21 last night had worsening dyspnea and required non invasive mechanical ventilation. Chest radiograph with possible retro cardiac infiltrate.  09/22 patient continues to deteriorate, he and his wife decided to transfer to hospice.   Assessment and Plan: Acute on chronic systolic CHF (congestive heart failure) (HCC) Echocardiogram with reduced LV systolic function EF 35 to 40%, with global hypokinesis, grade II diastolic dysfunction pseudo normalization EA, RV systolic function preserved, RVSP 72,8 mmHg, severe dilatation RA and LA, moderate TR   Continue to hold on furosemide  Limited medical therapy due to patient's frailty  Poor prognosis and transitioned to  full comfort care.   Essential hypertension Transitioned to comfort care.   Coronary artery disease involving native coronary artery of native heart No chest pain, no acute coronary syndrome.   Atrial fibrillation, chronic (HCC) Paced rhythm,  plan to transition to comfort care.  Spinal stenosis Patient very weak and deconditioned.   COPD with acute exacerbation (HCC) Acute on chronic hypoxemic respiratory failure.  Patient was placed on bronchodilator therapy and supplemental 02 per [place] and used intermittently bipap for increased work of breathing.   Patient very weak and deconditioned very poor respiratory reserve.   Patient was placed on antibiotic therapy for aspiration Pneumonia (present on admission) Ceftriaxone  IV  with only transitory improvement in his dyspnea.  Plan to continue with aspiration precautions. I suspect recurrent aspiration events.  Very poor prognosis with worsening respiratory status. Transition to residential hospice.   Pure hypercholesterolemia Transitioned to comfort care  Dysphagia Continue aspiration precautions.   Urinary retention Continue with tamsulosin .  Continue with Foley catheter in place, comfort care.         Consultants: palliative care  Procedures performed: none   Disposition: Hospice care Diet recommendation: regular with aspiration precautions  DISCHARGE MEDICATION: Allergies as of 12/18/2023   No Known Allergies      Medication List     STOP taking these medications    acetaminophen  650 MG CR tablet Commonly known as: TYLENOL    ampicillin  500 MG capsule Commonly known as: PRINCIPEN   apixaban  2.5 MG Tabs tablet Commonly known as: ELIQUIS    atorvastatin  20 MG tablet Commonly known as: LIPITOR   diclofenac  Sodium 1 % Gel Commonly known as: VOLTAREN    FISH OIL PO   furosemide  20 MG tablet Commonly known as: Lasix    hydrochlorothiazide 25 MG tablet Commonly known as: HYDRODIURIL   ipratropium-albuterol  0.5-2.5 (3) MG/3ML Soln Commonly known as: DUONEB   Iron 325 (65 Fe) MG Tabs   losartan  25 MG tablet Commonly known as: COZAAR    metoprolol  succinate 25 MG 24 hr tablet Commonly known as: Toprol  XL   Multivitamin Adult Tabs    ondansetron  4 MG tablet Commonly known as: ZOFRAN    predniSONE  10 MG tablet Commonly known as: DELTASONE    spironolactone  25 MG tablet Commonly known as: ALDACTONE    tamsulosin  0.4 MG Caps capsule Commonly known as: FLOMAX    traMADol  50 MG tablet Commonly known as: ULTRAM    Trelegy Ellipta 100-62.5-25 MCG/ACT Aepb Generic drug: Fluticasone-Umeclidin-Vilant        Discharge Exam: Filed Weights   12/12/23 1710 12/13/23 1522  Weight: 54.9 kg 52.6 kg   BP 109/68 (BP Location: Right Arm)   Pulse 84   Temp (!) 97.5 F (36.4 C) (Oral)   Resp 19   Ht 5' 9 (1.753 m)   Wt 52.6 kg   SpO2 97%   BMI 17.12 kg/m   Patient very weak and deconditioned, responds to yes and no questions, not conversation   Neurology awake, deconditioned and ill looking appearing ENT with mild pallor with no icterus  Cardiovascular with S1 and S2 present and regular with no gallops or rubs, positive systolic murmur at the apex Respiratory with bilateral rales and diffuse rhonchi, no wheezing, noted increased work of breathing with cervical respiratory muscle use.  Abdomen with no distention, non tender and soft No lower extremity edema   Condition at discharge: stable  The results of significant diagnostics from this hospitalization (including imaging, microbiology, ancillary and laboratory) are listed below for reference.   Imaging Studies: DG  CHEST PORT 1 VIEW Result Date: 12/16/2023 CLINICAL DATA:  Acute respiratory failure with hypoxia EXAM: PORTABLE CHEST 1 VIEW COMPARISON:  12/12/2023 FINDINGS: Left pacemaker remains in place, unchanged. Mild cardiomegaly. Aortic atherosclerosis. No confluent opacities, effusions or edema. No acute bony abnormality. IMPRESSION: Cardiomegaly.  No acute cardiopulmonary disease. Electronically Signed   By: Franky Crease M.D.   On: 12/16/2023 20:19   US  Abdomen Limited RUQ (LIVER/GB) Result Date: 12/12/2023 CLINICAL DATA:  221910 Elevated LFTs 221910 EXAM:  ULTRASOUND ABDOMEN LIMITED RIGHT UPPER QUADRANT COMPARISON:  CT abdomen pelvis 06/16/2017 FINDINGS: Gallbladder: No gallstones or wall thickening visualized. No sonographic Murphy sign noted by sonographer. Common bile duct: Diameter: 6 mm. Liver: 1.6 cm left hepatic lobe simple cyst. Increased parenchymal echogenicity. Portal vein is patent on color Doppler imaging with normal direction of blood flow towards the liver. Other: None. IMPRESSION: Hepatic steatosis. Please note limited evaluation for focal hepatic masses in a patient with hepatic steatosis due to decreased penetration of the acoustic ultrasound waves. Electronically Signed   By: Morgane  Naveau M.D.   On: 12/12/2023 23:39   DG Chest Portable 1 View Result Date: 12/12/2023 CLINICAL DATA:  Wheezing.  Shortness of breath. EXAM: PORTABLE CHEST 1 VIEW COMPARISON:  12/09/2023, CT 12/01/2023 FINDINGS: Left-sided pacemaker in place. The heart is enlarged. Aortic atherosclerosis and tortuosity. Improved right lung base aeration. There are small bilateral pleural effusions. Interstitial thickening suspicious for pulmonary edema. No pneumothorax. Chronic shoulder arthropathy. IMPRESSION: 1. Cardiomegaly with interstitial thickening suspicious for pulmonary edema. Small bilateral pleural effusions. 2. Improved right lung base aeration from prior. Electronically Signed   By: Andrea Gasman M.D.   On: 12/12/2023 18:11   DG CHEST PORT 1 VIEW Result Date: 12/09/2023 CLINICAL DATA:  Shortness of breath EXAM: PORTABLE CHEST 1 VIEW COMPARISON:  Film from earlier in the same day. FINDINGS: Cardiac shadow is stable. Pacing device is again seen. Aortic calcifications are noted. The lungs are well aerated bilaterally. Increasing airspace opacity in the right base is noted consistent with developing infiltrate. IMPRESSION: Increasing right basilar infiltrate. Electronically Signed   By: Oneil Devonshire M.D.   On: 12/09/2023 21:05   DG CHEST PORT 1 VIEW Result Date:  12/09/2023 CLINICAL DATA:  Dyspnea EXAM: PORTABLE CHEST 1 VIEW COMPARISON:  12/05/2023 FINDINGS: Dual lead pacer noted. Atherosclerotic calcification of the aortic arch. The patient is rotated to the right on today's radiograph, reducing diagnostic sensitivity and specificity. Substantial degenerative glenohumeral arthropathy bilaterally. Upper normal cardiac size. Improved aeration at the right lung base. Equivocal blunting of the right lateral costophrenic angle, cannot exclude trace right pleural effusion. IMPRESSION: 1. Improved aeration at the right lung base. 2. Equivocal blunting of the right lateral costophrenic angle, cannot exclude trace right pleural effusion. 3. Substantial degenerative glenohumeral arthropathy bilaterally. 4. Aortic Atherosclerosis (ICD10-I70.0). Electronically Signed   By: Ryan Salvage M.D.   On: 12/09/2023 12:54   DG Chest 1 View Result Date: 12/05/2023 CLINICAL DATA:  Short of breath EXAM: CHEST  1 VIEW COMPARISON:  12/04/2023 FINDINGS: 2 frontal views of the chest demonstrates stable dual lead pacemaker. The cardiac silhouette remains enlarged. Continued ectasia and atherosclerosis of the thoracic aorta. Stable veiling opacity at the right lung base consistent with consolidation and/or effusion. Persistent vascular congestion. No pneumothorax. No acute bony abnormalities. IMPRESSION: 1. Stable pulmonary vascular congestion and right basilar consolidation and/or effusion. 2. Stable enlarged cardiac silhouette. Electronically Signed   By: Ozell Daring M.D.   On: 12/05/2023 17:26   DG Chest  1 View Result Date: 12/04/2023 CLINICAL DATA:  Shortness of breath EXAM: CHEST  1 VIEW COMPARISON:  12/01/2023 FINDINGS: Cardiomegaly. Pacer wires in the right heart, unchanged. Aortic atherosclerosis. Focal airspace opacity in the right lower lobe, increasing since prior study. Minimal left basilar opacity likely reflects atelectasis. No visible effusions. No acute bony abnormality.  IMPRESSION: Cardiomegaly. Worsening right lower lobe airspace opacity concerning for pneumonia. Left base atelectasis. Electronically Signed   By: Franky Crease M.D.   On: 12/04/2023 17:41   DG Swallowing Func-Speech Pathology Result Date: 12/04/2023 Table formatting from the original result was not included. Modified Barium Swallow Study Patient Details Name: CAMP GOPAL MRN: [id number] Date of Birth: 03-Feb-1927 Today's Date: 12/04/2023 HPI/PMH: HPI: Patient is a 89 y.o. male with PMH: a-fib, HTN, asthma, pacemaker, pulmonary hypertension, severe CAD with multiple blockages per cath 07/2023. He presented to the hospital on 12/02/23 for SOB. CT showed bilateral pleural effusion and pulmonary nodule. SLP swallow evaluation ordered secondary to coughing with PO intake. Clinical Impression: Pt presents with a mild oral dysphagia, a moderate pharyngeal dysphagia, and concerns for an esophageal dysphagia (?primary source of dysphagia) per MBSS completed today. There was aspiration of thin and nectar-thick liquids. Improved airway protection observed with chin tuck with thin liquids. Findings: -There was scant, silent aspiration of thin liquids by spoon during/after the swallow and  frank, audible aspiration of thin liquids by straw before/during the swallow.  Pt with head in neutral position for both trials. -Chin tuck was effective for aiding airway protection completely or reducing airway violation to scant, transient or stagnant shallow penetration of thin liquids by straw. -There was scant, silent aspiration of nectar-thick liquid wash (post cracker trial) after the swallow. Pt's head was in neutral position. -No penetration or aspiration of nectar-thick liquids by cup. -Shallow penetration without ejection of thin liquids by cup (head in neutral) observed. -There was min-mod diffuse pharyngeal residue following thin, nectar-thick liquids and mod-large oropharyngeal residue following puree and cracker trials. Residue  reduced with cued additional swallows x1-2. Oral deficits characterized by reduced oral strength and lingual propulsion resulting in min-moderate oral residue of pudding and solids post initial swallow. Residue eventually reduced with additional swallows. Pharyngeal deficits characterized by reduced hyolaryngeal elevation/excursion, reduced base of tongue retraction, absent epiglottic inversion, reduced laryngeal vestibule closure, and reduced pharyngeal stripping. Concerns for esophageal dysphagia included moderate rentention of pudding and solid trials in the mid-lower esophagus with some retrograde flow below the UES. Pudding residue did clear with a liquid wash; however, the cracker residue remained despite multiple liquid washes. Due to esophageal retention of solids, discussed diet modification to mechanical ground/altered with pt and wife. Detailed diet recommendations outlined below. Please see follow up treatment note for further information pertaining to education and diet modification after discussion with pt and wife. Factors that may increase risk of adverse event in presence of aspiration Noe & Lianne 2021): Factors that may increase risk of adverse event in presence of aspiration Noe & Lianne 2021): Frail or deconditioned; Weak cough Recommendations/Plan: Swallowing Evaluation Recommendations Swallowing Evaluation Recommendations Recommendations: PO diet PO Diet Recommendation: Dysphagia 2 (Finely chopped); Thin liquids (Level 0) Liquid Administration via: Straw (with chin tuck!) Medication Administration: Whole meds with puree Supervision: Full supervision/cueing for swallowing strategies Swallowing strategies  : Minimize environmental distractions; Slow rate; Small bites/sips; Follow solids with liquids; Multiple dry swallows after each bite/sip; Chin tuck Postural changes: Position pt fully upright for meals; Stay upright 30-60 min after meals Oral care recommendations:  Oral care BID  (2x/day) Recommended consults: Consider esophageal assessment Treatment Plan Treatment Plan Treatment recommendations: Therapy as outlined in treatment plan below Follow-up recommendations: Skilled nursing-short term rehab (<3 hours/day) Functional status assessment: Patient has had a recent decline in their functional status and demonstrates the ability to make significant improvements in function in a reasonable and predictable amount of time. Treatment frequency: Min 1x/week Treatment duration: 2 weeks Interventions: Aspiration precaution training; Compensatory techniques; Patient/family education; Diet toleration management by SLP Recommendations Recommendations for follow up therapy are one component of a multi-disciplinary discharge planning process, led by the attending physician.  Recommendations may be updated based on patient status, additional functional criteria and insurance authorization. Assessment: Orofacial Exam: Orofacial Exam Oral Cavity: Oral Hygiene: WFL Oral Cavity - Dentition: Dentures, top (partial) Orofacial Anatomy: WFL Oral Motor/Sensory Function: WFL Anatomy: Anatomy: Prominent cricopharyngeus (appearance of a CP bar below UES - limited view due to shoulder position, though did not appear to impact bolus flow) Boluses Administered: Boluses Administered Boluses Administered: Thin liquids (Level 0); Mildly thick liquids (Level 2, nectar thick); Puree; Solid  Oral Impairment Domain: Oral Impairment Domain Lip Closure: No labial escape Tongue control during bolus hold: Cohesive bolus between tongue to palatal seal Bolus preparation/mastication: Slow prolonged chewing/mashing with complete recollection Bolus transport/lingual motion: Brisk tongue motion Oral residue: Residue collection on oral structures (with puree and cracker) Location of oral residue : Tongue; Palate Initiation of pharyngeal swallow : Valleculae (WNL for pt's age)  Pharyngeal Impairment Domain: Pharyngeal Impairment Domain  Soft palate elevation: No bolus between soft palate (SP)/pharyngeal wall (PW) Laryngeal elevation: Partial superior movement of thyroid cartilage/partial approximation of arytenoids to epiglottic petiole Anterior hyoid excursion: Partial anterior movement Epiglottic movement: No inversion Laryngeal vestibule closure: Incomplete, narrow column air/contrast in laryngeal vestibule Pharyngeal stripping wave : Present - diminished Pharyngeal contraction (A/P view only): N/A Pharyngoesophageal segment opening: Partial distention/partial duration, partial obstruction of flow Tongue base retraction: Narrow column of contrast or air between tongue base and PPW Pharyngeal residue: Collection of residue within or on pharyngeal structures  Esophageal Impairment Domain: Esophageal Impairment Domain Esophageal clearance upright position: Esophageal retention with retrograde flow below pharyngoesophageal segment (PES) Pill: No data recorded Penetration/Aspiration Scale Score: Penetration/Aspiration Scale Score 1.  Material does not enter airway: Thin liquids (Level 0); Mildly thick liquids (Level 2, nectar thick) 2.  Material enters airway, remains ABOVE vocal cords then ejected out: Thin liquids (Level 0); Mildly thick liquids (Level 2, nectar thick) 3.  Material enters airway, remains ABOVE vocal cords and not ejected out: Thin liquids (Level 0); Mildly thick liquids (Level 2, nectar thick); Puree; Solid 6.  Material enters airway, passes BELOW cords then ejected out: Mildly thick liquids (Level 2, nectar thick) 7.  Material enters airway, passes BELOW cords and not ejected out despite cough attempt by patient: Thin liquids (Level 0) 8.  Material enters airway, passes BELOW cords without attempt by patient to eject out (silent aspiration) : Thin liquids (Level 0); Mildly thick liquids (Level 2, nectar thick) Compensatory Strategies: Compensatory Strategies Compensatory strategies: Yes Straw: Effective (only with chin tuck;  worsened airway protection without using chin tuck strategy) Effective Straw: Thin liquid (Level 0) Multiple swallows: Effective (reduced pharygneal residue) Effective Multiple Swallows: Thin liquid (Level 0); Puree; Solid; Mildly thick liquid (Level 2, nectar thick) Chin tuck: Effective Effective Chin Tuck: Thin liquid (Level 0)   General Information: Caregiver present: Yes (at bedside following study)  Diet Prior to this Study: Regular; Thin liquids (Level 0)  Temperature : Normal   Respiratory Status: Increased WOB   Supplemental O2: Nasal cannula (1L)   History of Recent Intubation: No  Behavior/Cognition: Alert; Cooperative Self-Feeding Abilities: Able to self-feed Baseline vocal quality/speech: Dysphonic Volitional Cough: Able to elicit Volitional Swallow: Able to elicit (delayed) Exam Limitations: Limited visibility (shoulder position) Goal Planning: Prognosis for improved oropharyngeal function: Fair Barriers to Reach Goals: Overall medical prognosis No data recorded Patient/Family Stated Goal: safe PO intake Consulted and agree with results and recommendations: Patient; Family member/caregiver; Nurse Pain: Pain Assessment Pain Assessment: No/denies pain End of Session: Start Time:SLP Start Time (ACUTE ONLY): 1030 Stop Time: SLP Stop Time (ACUTE ONLY): 1100 Time Calculation:SLP Time Calculation (min) (ACUTE ONLY): 30 min Charges: SLP Evaluations $ SLP Speech Visit: 1 Visit SLP Evaluations $MBS Swallow: 1 Procedure SLP visit diagnosis: SLP Visit Diagnosis: Dysphagia, oropharyngeal phase (R13.12) Past Medical History: Past Medical History: Diagnosis Date  Asthma   Hypertension  Past Surgical History: Past Surgical History: Procedure Laterality Date  CATARACT EXTRACTION Left 2007  double hernia repair  2010  HERNIA REPAIR  1984  KNEE SURGERY Left 1992  PACEMAKER IMPLANT N/A 08/30/2023  Procedure: PACEMAKER IMPLANT;  Surgeon: Kennyth Chew, MD;  Location: [facility] INVASIVE CV LAB;  Service: Cardiovascular;   Laterality: N/A;  RIGHT/LEFT HEART CATH AND CORONARY ANGIOGRAPHY N/A 08/10/2023  Procedure: RIGHT/LEFT HEART CATH AND CORONARY ANGIOGRAPHY;  Surgeon: [place], Peter M, MD;  Location: [facility] INVASIVE CV LAB;  Service: Cardiovascular;  Laterality: N/A;  ROTATOR CUFF REPAIR Left 2001  ruptured blood vessel L eye Left 2008 Peyton JINNY Rummer 12/04/2023, 12:47 PM  CT Chest Wo Contrast Result Date: 12/01/2023 CLINICAL DATA:  Short of breath EXAM: CT CHEST WITHOUT CONTRAST TECHNIQUE: Multidetector CT imaging of the chest was performed following the standard protocol without IV contrast. RADIATION DOSE REDUCTION: This exam was performed according to the departmental dose-optimization program which includes automated exposure control, adjustment of the mA and/or kV according to patient size and/or use of iterative reconstruction technique. COMPARISON:  12/01/2023, 07/31/2023, 06/03/2003 FINDINGS: Cardiovascular: Unenhanced imaging of the heart demonstrates cardiomegaly without pericardial effusion. Dual lead cardiac pacer is identified, proximal lead within the right atrium and distal lead within the right ventricle. Normal caliber of the thoracic aorta. Atherosclerosis of the aorta and coronary vasculature. Assessment of the vascular lumen cannot be performed without intravenous contrast. Mediastinum/Nodes: No significant abnormalities of the thyroid, trachea, or esophagus. No pathologic adenopathy. Lungs/Pleura: There are small free-flowing bilateral pleural effusions, right greater than left. Dependent areas of consolidation within the lower lobes most consistent with atelectasis. No pneumothorax. Stable 7 mm right upper lobe Peri fissural subpleural nodule, reference image 97/3. Upper Abdomen: No acute upper abdominal findings. Musculoskeletal: No acute or destructive bony abnormalities. Severe bilateral shoulder osteoarthritis. Reconstructed images demonstrate no additional findings. IMPRESSION: 1. Small free-flowing bilateral  pleural effusions with dependent lower lobe atelectasis. 2. Stable 7 mm right upper lobe perifissural subpleural pulmonary nodule. As discussed on prior CT exam, minimal change since 06/03/2003 exam suggest benignity, and confirmation with a follow-up CT May 2026 recommended. 3. Mild cardiomegaly. 4. Aortic Atherosclerosis (ICD10-I70.0). Coronary artery atherosclerosis Electronically Signed   By: Ozell Daring M.D.   On: 12/01/2023 15:33   DG Chest Port 1 View Result Date: 12/01/2023 CLINICAL DATA:  Shortness of breath. EXAM: PORTABLE CHEST 1 VIEW COMPARISON:  August 30, 2023 FINDINGS: There is stable dual lead AICD positioning. The cardiac silhouette is mildly enlarged. Mild, diffuse, chronic appearing increased interstitial lung markings are seen. Mild areas  of atelectasis and/or infiltrate are noted within the bilateral lung bases. No pleural effusion or pneumothorax is identified. Multilevel degenerative changes are seen throughout the thoracic spine. IMPRESSION: Mild bibasilar atelectasis and/or infiltrate. Electronically Signed   By: Suzen Dials M.D.   On: 12/01/2023 13:39   CUP PACEART INCLINIC DEVICE CHECK Result Date: 12/01/2023 Normal in-clinic dual chamber pacemaker check. Presenting Rhythm: AS-VP (AF). Routine testing of thresholds, sensing, and impedance demonstrate stable parameters and no programming changes needed at this time. Unable to check atrial threshold with AF. On-going AF x ~ 1 month. Estimated longevity 79yr43mo. Pt enrolled in remote follow-up.   Microbiology: Results for orders placed or performed during the hospital encounter of 12/01/23  Urine Culture     Status: Abnormal   Collection Time: 12/08/23 11:29 AM   Specimen: Urine, Random  Result Value Ref Range Status   Specimen Description URINE, RANDOM  Final   Special Requests   Final    NONE Reflexed from [id number] Performed at [facility], [address], [place], [place] [address]    Culture >=100,000  COLONIES/mL ENTEROCOCCUS FAECALIS (A)  Final   Report Status 12/10/2023 FINAL  Final   Organism ID, Bacteria ENTEROCOCCUS FAECALIS (A)  Final      Susceptibility   Enterococcus faecalis - MIC*    AMPICILLIN  <=2 SENSITIVE Sensitive     NITROFURANTOIN <=16 SENSITIVE Sensitive     VANCOMYCIN 1 SENSITIVE Sensitive     * >=100,000 COLONIES/mL ENTEROCOCCUS FAECALIS    Labs: CBC: Recent Labs  Lab 12/12/23 1720 12/13/23 0421 12/14/23 0255 12/15/23 0312 12/16/23 0256 12/18/23 0251  WBC 14.1* 12.1* 15.6* 12.0* 12.3* 16.3*  NEUTROABS 13.0*  --   --   --   --  14.6*  HGB 12.5* 12.2* 12.0* 11.5* 11.9* 11.5*  HCT 38.4* 37.4* 36.6* 34.8* 35.7* 35.2*  MCV 97.2 96.4 95.1 95.1 94.2 94.4  PLT 187 173 189 164 166 104*   Basic Metabolic Panel: Recent Labs  Lab 12/12/23 1926 12/12/23 2302 12/13/23 0421 12/14/23 0255 12/15/23 0312 12/16/23 0256 12/18/23 0251  NA  --    < > 135 136 135 134* 135  K  --    < > 4.4 5.1 4.5 4.5 4.5  CL  --    < > 102 100 105 101 102  CO2  --    < > 25 24 24 24 25   GLUCOSE  --    < > 154* 137* 167* 124* 118*  BUN  --    < > 41* 44* 36* 28* 24*  CREATININE  --    < > 1.08 0.98 0.81 0.74 1.08  CALCIUM   --    < > 8.0* 8.2* 7.8* 7.9* 7.9*  MG 2.2  --  2.0 2.0  --   --   --   PHOS 4.1  --  5.7*  --   --   --   --    < > = values in this interval not displayed.   Liver Function Tests: Recent Labs  Lab 12/12/23 1720 12/13/23 0421  AST 55* 43*  ALT 95* 82*  ALKPHOS 69 62  BILITOT 1.0 0.9  PROT 5.2* 5.0*  ALBUMIN 2.7* 2.6*   CBG: No results for input(s): GLUCAP in the last 168 hours.  Discharge time spent: greater than 30 minutes.  Signed: Elidia Toribio Furnace, MD [facility] 12/18/2023

## 2023-12-18 NOTE — Progress Notes (Signed)
 Pharmacist Heart Failure Core Measure Documentation  Assessment: Guy Espinoza has an EF documented as 35-40% on 08/01/2023 by Echo.  Rationale: Heart failure patients with left ventricular systolic dysfunction (LVSD) and an EF < 40% should be prescribed an angiotensin converting enzyme inhibitor (ACEI) or angiotensin receptor blocker (ARB) at discharge unless a contraindication is documented in the medical record.  This patient is not currently on an ACEI or ARB for HF.  This note is being placed in the record in order to provide documentation that a contraindication to the use of these agents is present for this encounter.  ACE Inhibitor or Angiotensin Receptor Blocker is contraindicated (specify all that apply)  []   ACEI allergy AND ARB allergy []   Angioedema []   Moderate or severe aortic stenosis []   Hyperkalemia [x]   Hypotension []   Renal artery stenosis []   Worsening renal function, preexisting renal disease or dysfunction   Prentice Poisson, PharmD Clinical Pharmacist **Pharmacist phone directory can now be found on [email or web address] ([email or web address]).  Listed under [facility].

## 2023-12-27 DEATH — deceased

## 2024-01-01 NOTE — Progress Notes (Signed)
 Remote PPM Transmission

## 2024-01-12 ENCOUNTER — Encounter

## 2024-04-12 ENCOUNTER — Encounter

## 2024-07-12 ENCOUNTER — Encounter

## 2024-10-11 ENCOUNTER — Encounter

## 2025-01-10 ENCOUNTER — Encounter

## 2025-04-11 ENCOUNTER — Encounter

## 2025-07-11 ENCOUNTER — Encounter
